# Patient Record
Sex: Male | Born: 1980 | Race: White | Hispanic: No | Marital: Single | Smoking: Former smoker
Health system: Southern US, Community
[De-identification: ages and names within clinical notes are randomized; demographics above are authoritative.]

## PROBLEM LIST (undated history)

## (undated) DIAGNOSIS — F419 Anxiety disorder, unspecified: Secondary | ICD-10-CM

## (undated) DIAGNOSIS — G932 Benign intracranial hypertension: Secondary | ICD-10-CM

## (undated) DIAGNOSIS — G4730 Sleep apnea, unspecified: Secondary | ICD-10-CM

## (undated) DIAGNOSIS — I8290 Acute embolism and thrombosis of unspecified vein: Secondary | ICD-10-CM

## (undated) HISTORY — PX: OTHER SURGICAL HISTORY: SHX169

## (undated) HISTORY — DX: Sleep apnea, unspecified: G47.30

---

## 2014-12-31 DIAGNOSIS — R9389 Abnormal findings on diagnostic imaging of other specified body structures: Secondary | ICD-10-CM | POA: Insufficient documentation

## 2016-07-16 DIAGNOSIS — R931 Abnormal findings on diagnostic imaging of heart and coronary circulation: Secondary | ICD-10-CM | POA: Insufficient documentation

## 2016-11-04 DIAGNOSIS — G4733 Obstructive sleep apnea (adult) (pediatric): Secondary | ICD-10-CM | POA: Diagnosis not present

## 2017-02-15 DIAGNOSIS — D225 Melanocytic nevi of trunk: Secondary | ICD-10-CM | POA: Diagnosis not present

## 2017-02-15 DIAGNOSIS — D485 Neoplasm of uncertain behavior of skin: Secondary | ICD-10-CM | POA: Diagnosis not present

## 2017-02-15 DIAGNOSIS — L578 Other skin changes due to chronic exposure to nonionizing radiation: Secondary | ICD-10-CM | POA: Diagnosis not present

## 2017-02-27 DIAGNOSIS — N644 Mastodynia: Secondary | ICD-10-CM | POA: Diagnosis not present

## 2017-03-07 DIAGNOSIS — D485 Neoplasm of uncertain behavior of skin: Secondary | ICD-10-CM | POA: Diagnosis not present

## 2017-03-14 DIAGNOSIS — D485 Neoplasm of uncertain behavior of skin: Secondary | ICD-10-CM | POA: Diagnosis not present

## 2017-03-14 DIAGNOSIS — L988 Other specified disorders of the skin and subcutaneous tissue: Secondary | ICD-10-CM | POA: Diagnosis not present

## 2017-03-30 DIAGNOSIS — N6459 Other signs and symptoms in breast: Secondary | ICD-10-CM | POA: Diagnosis not present

## 2017-08-10 DIAGNOSIS — F419 Anxiety disorder, unspecified: Secondary | ICD-10-CM | POA: Diagnosis not present

## 2017-08-16 DIAGNOSIS — G4733 Obstructive sleep apnea (adult) (pediatric): Secondary | ICD-10-CM | POA: Diagnosis not present

## 2017-08-18 DIAGNOSIS — H538 Other visual disturbances: Secondary | ICD-10-CM | POA: Diagnosis not present

## 2017-08-18 DIAGNOSIS — D3132 Benign neoplasm of left choroid: Secondary | ICD-10-CM | POA: Diagnosis not present

## 2017-08-18 DIAGNOSIS — Z87891 Personal history of nicotine dependence: Secondary | ICD-10-CM | POA: Diagnosis not present

## 2017-08-18 DIAGNOSIS — H4710 Unspecified papilledema: Secondary | ICD-10-CM | POA: Diagnosis not present

## 2017-08-18 DIAGNOSIS — H539 Unspecified visual disturbance: Secondary | ICD-10-CM | POA: Diagnosis not present

## 2017-08-18 DIAGNOSIS — H40003 Preglaucoma, unspecified, bilateral: Secondary | ICD-10-CM | POA: Diagnosis not present

## 2017-08-18 DIAGNOSIS — R930 Abnormal findings on diagnostic imaging of skull and head, not elsewhere classified: Secondary | ICD-10-CM | POA: Diagnosis not present

## 2017-08-18 DIAGNOSIS — Z6841 Body Mass Index (BMI) 40.0 and over, adult: Secondary | ICD-10-CM | POA: Diagnosis not present

## 2017-08-19 DIAGNOSIS — H4710 Unspecified papilledema: Secondary | ICD-10-CM | POA: Diagnosis not present

## 2017-08-19 DIAGNOSIS — H539 Unspecified visual disturbance: Secondary | ICD-10-CM | POA: Diagnosis not present

## 2017-08-19 DIAGNOSIS — R930 Abnormal findings on diagnostic imaging of skull and head, not elsewhere classified: Secondary | ICD-10-CM | POA: Diagnosis not present

## 2017-08-24 DIAGNOSIS — G932 Benign intracranial hypertension: Secondary | ICD-10-CM | POA: Diagnosis not present

## 2017-08-24 DIAGNOSIS — H4710 Unspecified papilledema: Secondary | ICD-10-CM | POA: Diagnosis not present

## 2017-09-14 DIAGNOSIS — Z6841 Body Mass Index (BMI) 40.0 and over, adult: Secondary | ICD-10-CM | POA: Diagnosis not present

## 2017-09-14 DIAGNOSIS — G932 Benign intracranial hypertension: Secondary | ICD-10-CM | POA: Diagnosis not present

## 2017-09-14 DIAGNOSIS — H539 Unspecified visual disturbance: Secondary | ICD-10-CM | POA: Diagnosis not present

## 2017-09-15 DIAGNOSIS — G932 Benign intracranial hypertension: Secondary | ICD-10-CM | POA: Insufficient documentation

## 2017-10-10 DIAGNOSIS — H4710 Unspecified papilledema: Secondary | ICD-10-CM | POA: Diagnosis not present

## 2017-10-10 DIAGNOSIS — H40003 Preglaucoma, unspecified, bilateral: Secondary | ICD-10-CM | POA: Diagnosis not present

## 2017-10-10 DIAGNOSIS — D3132 Benign neoplasm of left choroid: Secondary | ICD-10-CM | POA: Diagnosis not present

## 2017-10-12 DIAGNOSIS — H40003 Preglaucoma, unspecified, bilateral: Secondary | ICD-10-CM | POA: Diagnosis not present

## 2017-11-09 DIAGNOSIS — H40003 Preglaucoma, unspecified, bilateral: Secondary | ICD-10-CM | POA: Diagnosis not present

## 2017-11-09 DIAGNOSIS — H4710 Unspecified papilledema: Secondary | ICD-10-CM | POA: Diagnosis not present

## 2017-11-09 DIAGNOSIS — D3132 Benign neoplasm of left choroid: Secondary | ICD-10-CM | POA: Diagnosis not present

## 2017-11-23 DIAGNOSIS — H40003 Preglaucoma, unspecified, bilateral: Secondary | ICD-10-CM | POA: Diagnosis not present

## 2017-12-13 DIAGNOSIS — H40003 Preglaucoma, unspecified, bilateral: Secondary | ICD-10-CM | POA: Diagnosis not present

## 2017-12-13 DIAGNOSIS — H4710 Unspecified papilledema: Secondary | ICD-10-CM | POA: Diagnosis not present

## 2017-12-13 DIAGNOSIS — D3132 Benign neoplasm of left choroid: Secondary | ICD-10-CM | POA: Diagnosis not present

## 2018-01-18 DIAGNOSIS — H539 Unspecified visual disturbance: Secondary | ICD-10-CM | POA: Diagnosis not present

## 2018-01-18 DIAGNOSIS — F419 Anxiety disorder, unspecified: Secondary | ICD-10-CM | POA: Diagnosis not present

## 2018-01-18 DIAGNOSIS — Z6841 Body Mass Index (BMI) 40.0 and over, adult: Secondary | ICD-10-CM | POA: Diagnosis not present

## 2018-01-18 DIAGNOSIS — H4710 Unspecified papilledema: Secondary | ICD-10-CM | POA: Diagnosis not present

## 2018-01-18 DIAGNOSIS — G932 Benign intracranial hypertension: Secondary | ICD-10-CM | POA: Diagnosis not present

## 2018-01-18 DIAGNOSIS — Z87891 Personal history of nicotine dependence: Secondary | ICD-10-CM | POA: Diagnosis not present

## 2018-01-18 DIAGNOSIS — Z86718 Personal history of other venous thrombosis and embolism: Secondary | ICD-10-CM | POA: Diagnosis not present

## 2018-01-18 DIAGNOSIS — K219 Gastro-esophageal reflux disease without esophagitis: Secondary | ICD-10-CM | POA: Diagnosis not present

## 2018-02-14 DIAGNOSIS — G932 Benign intracranial hypertension: Secondary | ICD-10-CM | POA: Diagnosis not present

## 2018-02-14 DIAGNOSIS — D3132 Benign neoplasm of left choroid: Secondary | ICD-10-CM | POA: Diagnosis not present

## 2018-02-14 DIAGNOSIS — H40003 Preglaucoma, unspecified, bilateral: Secondary | ICD-10-CM | POA: Diagnosis not present

## 2018-04-20 ENCOUNTER — Ambulatory Visit
Admission: EM | Admit: 2018-04-20 | Discharge: 2018-04-20 | Disposition: A | Discharge status: Home / Self Care | Payer: BLUE CROSS/BLUE SHIELD | Source: Home / Self Care | Attending: Family Medicine | Admitting: Family Medicine

## 2018-04-20 ENCOUNTER — Emergency Department: Payer: BLUE CROSS/BLUE SHIELD

## 2018-04-20 ENCOUNTER — Emergency Department
Admission: EM | Admit: 2018-04-20 | Discharge: 2018-04-21 | Disposition: A | Discharge status: Home / Self Care | Payer: BLUE CROSS/BLUE SHIELD | Attending: Emergency Medicine | Admitting: Emergency Medicine

## 2018-04-20 ENCOUNTER — Other Ambulatory Visit: Payer: Self-pay

## 2018-04-20 ENCOUNTER — Ambulatory Visit (INDEPENDENT_AMBULATORY_CARE_PROVIDER_SITE_OTHER): Payer: BLUE CROSS/BLUE SHIELD

## 2018-04-20 DIAGNOSIS — J069 Acute upper respiratory infection, unspecified: Secondary | ICD-10-CM

## 2018-04-20 DIAGNOSIS — R0781 Pleurodynia: Secondary | ICD-10-CM

## 2018-04-20 DIAGNOSIS — R05 Cough: Secondary | ICD-10-CM

## 2018-04-20 DIAGNOSIS — R7989 Other specified abnormal findings of blood chemistry: Secondary | ICD-10-CM

## 2018-04-20 DIAGNOSIS — R0602 Shortness of breath: Secondary | ICD-10-CM | POA: Diagnosis not present

## 2018-04-20 DIAGNOSIS — R079 Chest pain, unspecified: Secondary | ICD-10-CM | POA: Diagnosis not present

## 2018-04-20 DIAGNOSIS — Z87891 Personal history of nicotine dependence: Secondary | ICD-10-CM | POA: Insufficient documentation

## 2018-04-20 DIAGNOSIS — R0789 Other chest pain: Secondary | ICD-10-CM | POA: Diagnosis not present

## 2018-04-20 DIAGNOSIS — I2699 Other pulmonary embolism without acute cor pulmonale: Secondary | ICD-10-CM | POA: Insufficient documentation

## 2018-04-20 DIAGNOSIS — Z86718 Personal history of other venous thrombosis and embolism: Secondary | ICD-10-CM | POA: Insufficient documentation

## 2018-04-20 DIAGNOSIS — Z7901 Long term (current) use of anticoagulants: Secondary | ICD-10-CM | POA: Diagnosis not present

## 2018-04-20 HISTORY — DX: Acute embolism and thrombosis of unspecified vein: I82.90

## 2018-04-20 HISTORY — DX: Anxiety disorder, unspecified: F41.9

## 2018-04-20 HISTORY — DX: Benign intracranial hypertension: G93.2

## 2018-04-20 LAB — CBC
HEMATOCRIT: 39.6 % — AB (ref 40.0–52.0)
HEMOGLOBIN: 13.6 g/dL (ref 13.0–18.0)
MCH: 29.2 pg (ref 26.0–34.0)
MCHC: 34.3 g/dL (ref 32.0–36.0)
MCV: 85 fL (ref 80.0–100.0)
Platelets: 335 10*3/uL (ref 150–440)
RBC: 4.66 MIL/uL (ref 4.40–5.90)
RDW: 13.5 % (ref 11.5–14.5)
WBC: 10.2 10*3/uL (ref 3.8–10.6)

## 2018-04-20 LAB — FIBRIN DERIVATIVES D-DIMER (ARMC ONLY): FIBRIN DERIVATIVES D-DIMER (ARMC): 2519.38 ng{FEU}/mL — AB (ref 0.00–499.00)

## 2018-04-20 LAB — BASIC METABOLIC PANEL
ANION GAP: 7 (ref 5–15)
BUN: 12 mg/dL (ref 6–20)
CO2: 23 mmol/L (ref 22–32)
Calcium: 8.7 mg/dL — ABNORMAL LOW (ref 8.9–10.3)
Chloride: 109 mmol/L (ref 101–111)
Creatinine, Ser: 1.03 mg/dL (ref 0.61–1.24)
GFR calc non Af Amer: >60 mL/min (ref >60)
Glucose, Bld: 113 mg/dL — ABNORMAL HIGH (ref 65–99)
POTASSIUM: 3.3 mmol/L — AB (ref 3.5–5.1)
SODIUM: 139 mmol/L (ref 135–145)

## 2018-04-20 LAB — TROPONIN I

## 2018-04-20 MED ORDER — APIXABAN 5 MG PO TABS: ORAL_TABLET | ORAL | 0 refills | Status: DC

## 2018-04-20 MED ORDER — IOPAMIDOL (ISOVUE-370) INJECTION 76%
100.0000 mL | Freq: Once | INTRAVENOUS | Status: AC | PRN
  Administered 2018-04-20: 100 mL via INTRAVENOUS

## 2018-04-20 MED ORDER — APIXABAN 5 MG PO TABS
10.0000 mg | ORAL_TABLET | ORAL | Status: AC
  Administered 2018-04-21: 10 mg via ORAL
  Filled 2018-04-20: qty 2

## 2018-04-20 NOTE — ED Provider Notes (Signed)
[facility] ____________________________________________  Time seen: Approximately 6:40 PM  I have reviewed the triage vital signs and the nursing notes.   HISTORY  Chief Complaint Cough   HPI Ilir Mahrt is a 37 y.o. male past medical history of superficial thrombophlebitis and idiopathic intracranial hypertension presenting for evaluation of 4 days of cough and congestion complaints.  Reports intermittent subjective fevers.  States over the last few days he has been having intermittent pain to his left side describing it as a sharp pain, mostly present with deep breaths but also present with movement and overhead reaching.  States minimal presence of discomfort when sitting completely still without deep breaths.  Denies associated chest pain or shortness of breath.  States he has been coughing but not too forcefully.  States cough has been disrupting sleep at night as well as having to toss and turn more frequently.  Reports his son recently sick with similar complaints earlier in the week.  Unresolved with intermittent Advil and DayQuil.  Has continued to remain active.  Continues to drink fluids well.  Denies other aggravating or alleviating factors.  Denies trauma.Denies chest pain, shortness of breath, abdominal pain, extremity pain, extremity swelling or rash. Denies recent sickness. Denies recent antibiotic use.     Past Medical History:  Diagnosis Date  . Anxiety   . Idiopathic intracranial hypertension     There are no active problems to display for this patient.   Past Surgical History:  Procedure Laterality Date  . club feet       No current facility-administered medications for this encounter.   Current Outpatient Medications:  .  acetaZOLAMIDE (DIAMOX) 250 MG tablet, Take 1,500 mg by mouth 2 (two) times daily., Disp: , Rfl:  .  omeprazole (PRILOSEC) 20 MG capsule, omeprazole 20 mg capsule,delayed release, Disp: , Rfl:  .  sertraline (ZOLOFT) 100 MG  tablet, sertraline 100 mg tablet, Disp: , Rfl:  .  ALPRAZolam (XANAX) 0.5 MG tablet, alprazolam 0.5 mg tablet, Disp: , Rfl:   Allergies Cefoxitin  Family History  Problem Relation Age of Onset  . Hypertension Mother   . Healthy Father     Social History Social History   Tobacco Use  . Smoking status: Former Smoker    Packs/day: 0.50    Years: 10.00    Pack years: 5.00    Types: Cigarettes    Last attempt to quit: 11/28/2015    Years since quitting: 2.3  . Smokeless tobacco: Never Used  Substance Use Topics  . Alcohol use: Not Currently  . Drug use: Never    Review of Systems Constitutional: As above.  ENT: No sore throat. Cardiovascular: Denies chest pain. Respiratory: Denies shortness of breath. Gastrointestinal: No abdominal pain.  No nausea, no vomiting.  No diarrhea.  No constipation. Genitourinary: Negative for dysuria. Musculoskeletal: Negative for back pain. Skin: Negative for rash. Neurological: Negative forfocal weakness or numbness. .  ____________________________________________   PHYSICAL EXAM:  VITAL SIGNS: ED Triage Vitals  Enc Vitals Group     BP 04/20/18 1737 (!) 145/81     Pulse Rate 04/20/18 1737 89     Resp 04/20/18 1737 16     Temp 04/20/18 1737 99.6 F (37.6 C)     Temp Source 04/20/18 1737 Oral     SpO2 04/20/18 1737 99 %     Weight 04/20/18 1742 300 lb (136.1 kg)     Height 04/20/18 1742  (1.803 m)     Head Circumference --  Peak Flow --      Pain Score 04/20/18 1742 5     Pain Loc --      Pain Edu? --      Excl. in GC? --     Constitutional: Alert and oriented. Well appearing and in no acute distress. Eyes: Conjunctivae are normal.  Head: Atraumatic. No sinus tenderness to palpation. No swelling. No erythema.  Ears: no erythema, normal TMs bilaterally.   Nose:Nasal congestion  Mouth/Throat: Mucous membranes are moist. No pharyngeal erythema. No tonsillar swelling or exudate.  Neck: No stridor.  No cervical spine  tenderness to palpation. Hematological/Lymphatic/Immunilogical: No cervical lymphadenopathy. Cardiovascular: Normal rate, regular rhythm. Grossly normal heart sounds.  Good peripheral circulation. Respiratory: Normal respiratory effort.  No retractions. No wheezes or rales.  Mild scattered rhonchi.  Good air movement.  Gastrointestinal: Soft and nontender. Obese abdomen.  Musculoskeletal: Ambulatory with steady gait. No cervical, thoracic or lumbar tenderness to palpation.  No lower extremity edema bilaterally.  Left lateral ribs approximately third and fourth mild to moderate direct tenderness to palpation, pain also reproduced with overhead reaching.  No rash, no ecchymosis. Neurologic:  Normal speech and language. No gait instability. Skin:  Skin appears warm, dry and intact. No rash noted. Psychiatric: Mood and affect are normal. Speech and behavior are normal.  ___________________________________________   LABS (all labs ordered are listed, but only abnormal results are displayed)  Labs Reviewed  FIBRIN DERIVATIVES D-DIMER ([facility] ONLY) - Abnormal; Notable for the following components:      Result Value   Fibrin derivatives D-dimer [facility]) 2,519.38 (*)    All other components within normal limits   ____________________________________________  EKG Declined  RADIOLOGY  Dg Chest 2 View  Result Date: 04/20/2018 CLINICAL DATA:  37 y/o M; 4-5 days of dry cough. Left-sided pleuritic chest pain. EXAM: CHEST - 2 VIEW COMPARISON:  None. FINDINGS: Normal cardiac silhouette given projection and technique. Patchy opacities within the left upper lobe lingula and dependent left lower lobe. No pleural effusion or pneumothorax. Bones are unremarkable. IMPRESSION: Patchy opacities in the left upper lobe lingula and dependent left lower lobe probably representing pneumonia. Electronically Signed   By: Mitzi Hansen M.D.   On: 04/20/2018 18:56    ____________________________________________   PROCEDURES Procedures    INITIAL IMPRESSION / ASSESSMENT AND PLAN / ED COURSE  Pertinent labs & imaging results that were available during my care of the patient were reviewed by me and considered in my medical decision making (see chart for details).  Well appearing patient.  No acute distress.  Wife at bedside.  Suspect patient with recent acute viral upper respiratory infection with cough.  However with patient's past medical history, family as well expressed concern of PE.  Patient pulse ox 99% and not tachycardic, however with his medical history, discussed use of d-dimer.  Patient requested d-dimer to be completed.  We will also complete chest x-ray.  Patient declined EKG.  Chest x-ray result as above per radiologist, patchy opacities left upper lobe lingula and left lower lobe probably representing pneumonia.  However patient care everywhere notes reviewed and noted that patient does have a history of left lower lobe nodule, concern possible conflicting x-ray report.  D-dimer returned markedly elevated.  As with d-dimer elevation, recommend patient further evaluation in emergency room at this time, for further imaging patient and family verbalized understanding and agreed with this plan, states that wife will [facility].  Dawn RN Triage was called and given  report.  Patient stable at time of discharge.   ____________________________________________   FINAL CLINICAL IMPRESSION(S) / ED DIAGNOSES  Final diagnoses:  Elevated d-dimer  Rib pain on left side  Acute upper respiratory infection     ED Discharge Orders    None       Note: This dictation was prepared with Dragon dictation along with smaller phrase technology. Any transcriptional errors that result from this process are unintentional.         Renford Dills, NP 04/20/18 2010

## 2018-04-20 NOTE — ED Triage Notes (Signed)
Pt states he has been coughing, chills, felt feverish x 4 days. His main complaint is right sided rib pain that radiates into his axilla. Exacerbated by deep breathing. Pain is intermittent. This has been occurring for 3 days.

## 2018-04-20 NOTE — ED Provider Notes (Signed)
[facility] Emergency Department Provider Note  ____________________________________________   First MD Initiated Contact with Patient 04/20/18 2317     (approximate)  I have reviewed the triage vital signs and the nursing notes.   HISTORY  Chief Complaint Chest Pain and Shortness of Breath    HPI Shane Oneill is a 37 y.o. male with medical history as listed below which specifically includes 3 prior episodes of blood clots in his lungs requiring treatment with Eliquis.  His last round of Eliquis was about a year and half ago.  He presents today for further evaluation after going to urgent care and is being referred to the emergency department due to an elevated d-dimer.  He reports that for about 5 days he has had intermittent sharp stabbing pain in the left lateral chest.  Nothing particular makes his symptoms better or worse.  Occasionally he feels a little bit short of breath due to the pain.  He is also had a mild nonproductive cough for the same period of time.  His wife encouraged him to come in for evaluation given his history of blood clots in the legs and because his symptoms seem to unusual and that the pain with start up suddenly and go away just as quickly.  He has felt subjective fevers and had some shaking chills and initially he thought he might have a viral respiratory infection because his son had one about a week ago, but when the pain persisted he thought he should get evaluated.  He denies nausea, vomiting, any other chest pain, any other shortness of breath including with exertion, abdominal pain, and dysuria.  He describes the symptoms as moderate in severity and as stated above there is nothing in particular that makes them better or worse.  He states that during his prior evaluations for blood clots, he never had any testing to determine genetic predisposition for clot disorders.  No one else in his family has blood clots in his legs or lungs.   He has not had any recent trips or immobilizations or surgeries, he no longer smokes, and he reports no recent pain or swelling in either of his legs.  Past Medical History:  Diagnosis Date  . Anxiety   . Blood clot in vein   . Idiopathic intracranial hypertension     There are no active problems to display for this patient.   Past Surgical History:  Procedure Laterality Date  . club feet      Prior to Admission medications   Medication Sig Start Date End Date Taking? Authorizing Provider  acetaZOLAMIDE (DIAMOX) 250 MG tablet Take 1,500 mg by mouth 2 (two) times daily.    [provider]  ALPRAZolam Prudy Feeler) 0.5 MG tablet alprazolam 0.5 mg tablet 08/10/17   [provider]  apixaban (ELIQUIS) 5 MG TABS tablet Take 2 tablets (10 mg) PO BID x 7 days, then reduce dose to 1 tablet (5 mg) PO BID. 04/20/18   Loleta Rose, MD  omeprazole (PRILOSEC) 20 MG capsule omeprazole 20 mg capsule,delayed release 08/13/17   [provider]  sertraline (ZOLOFT) 100 MG tablet sertraline 100 mg tablet 08/10/17   [provider]    Allergies Cefoxitin  Family History  Problem Relation Age of Onset  . Hypertension Mother   . Healthy Father     Social History Social History   Tobacco Use  . Smoking status: Former Smoker    Packs/day: 0.50    Years: 10.00  Pack years: 5.00    Types: Cigarettes    Last attempt to quit: 11/28/2015    Years since quitting: 2.3  . Smokeless tobacco: Never Used  Substance Use Topics  . Alcohol use: Not Currently  . Drug use: Never    Review of Systems Constitutional: No fever/chills Eyes: No visual changes. ENT: No sore throat. Cardiovascular: Left-sided chest pain and associated shortness of breath as described above. Respiratory: Left-sided chest pain and associated shortness of breath as described above. Gastrointestinal: No abdominal pain.  No nausea, no vomiting.  No diarrhea.  No constipation. Genitourinary:  Negative for dysuria. Musculoskeletal: Negative for neck pain.  Negative for back pain. Integumentary: Negative for rash. Neurological: Negative for headaches, focal weakness or numbness.   ____________________________________________   PHYSICAL EXAM:  VITAL SIGNS: ED Triage Vitals  Enc Vitals Group     BP 04/20/18 2047 131/83     Pulse Rate 04/20/18 2047 92     Resp 04/20/18 2047 (!) 21     Temp 04/20/18 2047 100 F (37.8 C)     Temp Source 04/20/18 2047 Oral     SpO2 04/20/18 2047 96 %     Weight --      Height --      Head Circumference --      Peak Flow --      Pain Score 04/20/18 2049 5     Pain Loc --      Pain Edu? --      Excl. in GC? --     Constitutional: Alert and oriented. Well appearing and in no acute distress. Eyes: Conjunctivae are normal.  Head: Atraumatic. Nose: No congestion/rhinnorhea. Mouth/Throat: Mucous membranes are moist. Neck: No stridor.  No meningeal signs.   Cardiovascular: Normal rate, regular rhythm. Good peripheral circulation. Grossly normal heart sounds. Respiratory: Normal respiratory effort.  No retractions. Lungs CTAB. Gastrointestinal: Obese.  Soft and nontender. No distention.  Musculoskeletal: No lower extremity tenderness nor edema. No gross deformities of extremities. Neurologic:  Normal speech and language. No gross focal neurologic deficits are appreciated.  Skin:  Skin is warm, dry and intact. No rash noted. Psychiatric: Mood and affect are normal. Speech and behavior are normal.  ____________________________________________   LABS (all labs ordered are listed, but only abnormal results are displayed)  Labs Reviewed  CBC - Abnormal; Notable for the following components:      Result Value   HCT 39.6 (*)    All other components within normal limits  BASIC METABOLIC PANEL - Abnormal; Notable for the following components:   Potassium 3.3 (*)    Glucose, Bld 113 (*)    Calcium 8.7 (*)    All other components within  normal limits  TROPONIN I  PROTIME-INR  APTT   ____________________________________________  EKG  ED ECG REPORT I, Loleta Rose, the attending physician, personally viewed and interpreted this ECG.  Date: 04/20/2018 EKG Time: 20: 39 Rate: 92 Rhythm: normal sinus rhythm QRS Axis: normal Intervals: normal ST/T Wave abnormalities: T wave inversions in lead III, otherwise unremarkable. Narrative Interpretation: no evidence of acute ischemia  ____________________________________________  RADIOLOGY I, Loleta Rose, personally discussed these images and results by phone with the on-call radiologist and used this discussion as part of my medical decision making.   ED MD interpretation: Patchy opacities in the left upper lobe on chest x-ray were found to represent infarction on the CT angio chest.  Patient has multiple acute segmental pulmonary emboli in bilateral lungs but no evidence  of right heart strain.  Official radiology report(s): Dg Chest 2 View  Result Date: 04/20/2018 CLINICAL DATA:  37 y/o M; 4-5 days of dry cough. Left-sided pleuritic chest pain. EXAM: CHEST - 2 VIEW COMPARISON:  None. FINDINGS: Normal cardiac silhouette given projection and technique. Patchy opacities within the left upper lobe lingula and dependent left lower lobe. No pleural effusion or pneumothorax. Bones are unremarkable. IMPRESSION: Patchy opacities in the left upper lobe lingula and dependent left lower lobe probably representing pneumonia. Electronically Signed   By: Mitzi Hansen M.D.   On: 04/20/2018 18:56   Ct Angio Chest Pe W And/or Wo Contrast  Result Date: 04/20/2018 CLINICAL DATA:  37 y/o M; left chest pain, shortness of breath, cough. History of PE. Elevated D-dimer. EXAM: CT ANGIOGRAPHY CHEST WITH CONTRAST TECHNIQUE: Multidetector CT imaging of the chest was performed using the standard protocol during bolus administration of intravenous contrast. Multiplanar CT image reconstructions  and MIPs were obtained to evaluate the vascular anatomy. CONTRAST:  ISOVUE-370 IOPAMIDOL (ISOVUE-370) INJECTION 76% COMPARISON:  04/20/2018 chest radiograph FINDINGS: Cardiovascular: Multiple bilateral segmental acute pulmonary emboli. No findings of right heart strain. Normal caliber thoracic aorta and main pulmonary artery. Mediastinum/Nodes: No enlarged mediastinal, hilar, or axillary lymph nodes. Thyroid gland, trachea, and esophagus demonstrate no significant findings. Lungs/Pleura: Small left pleural effusion. Wedge-shaped foci of consolidation within the left upper lobe lingula and left lower lobe with central lucency are compatible with pulmonary infarction. Upper Abdomen: No acute abnormality. Musculoskeletal: No chest wall abnormality. No acute or significant osseous findings. Review of the MIP images confirms the above findings. IMPRESSION: 1. Multiple acute segmental pulmonary emboli in the lungs bilaterally. 2. Small left upper lobe lingula and dependent left lower lobe pulmonary infarctions. 3. Small left pleural effusion. Critical Value/emergent results were called by telephone at the time of interpretation on 04/20/2018 at 11:10 pm to Dr. York Cerise, who verbally acknowledged these results. Electronically Signed   By: Mitzi Hansen M.D.   On: 04/20/2018 23:18    ____________________________________________   PROCEDURES  Critical Care performed: No   Procedure(s) performed:   Procedures   ____________________________________________   INITIAL IMPRESSION / ASSESSMENT AND PLAN / ED COURSE  As part of my medical decision making, I reviewed the following data within the electronic MEDICAL RECORD NUMBER History obtained from family, Nursing notes reviewed and incorporated, Labs reviewed , EKG interpreted  and A phone consult was requested and obtained from this/these consultant(s) (pulmonology, Dr. Meredeth Ide), discussed case with radiologist (Dr. Harrie Jeans)    Differential  diagnosis includes, but is not limited to, pulmonary emboli, pneumonia, pneumothorax, ACS.  The patient had an elevated d-dimer at urgent care with a history of venous thromboembolism so he received a CTA chest immediately upon arrival in the emergency department.  I saw him just after the radiologist called me to discuss the results of multiple acute segmental pulmonary emboli.  He is in no acute distress at this time with normal vital signs, including no tachycardia, no tachypnea, no hypoxemia.  I had a lengthy conversation with the patient and his wife in terms of his history, his need for lifelong anticoagulation given that this is his fourth episode of venous thromboembolism, and the appropriate disposition plan tonight.  He very much does not want to stay in the hospital unless he absolutely has to.  I explained to him about low risk versus high risk patients with pulmonary emboli and he does fall into a relatively low risk category given no concerning  objective findings such as vital sign abnormalities or right heart strain.  Based on talking with the patient and his wife and reviewing all the information, I think he is appropriate for starting treatment with Eliquis and close outpatient follow-up.  However I also called and discussed the case with Dr. Meredeth Ide and he also confirmed that outpatient treatment should be adequate and appropriate for this patient and he is unlikely to receive anything new or different by coming into the hospital.  I ordered the first dose of Eliquis 10 mg by mouth tonight and I do believe that the patient and his wife will follow up appropriately and continue treatment tomorrow.  I gave strict return precautions should he develop any new or worsening symptoms.  They agree with the plan and understand the importance of follow-up.  Dr. Meredeth Ide so they can follow-up with him but I also provided the name and number of a hematologist with whom they can follow-up as well for  additional testing.     ____________________________________________  FINAL CLINICAL IMPRESSION(S) / ED DIAGNOSES  Final diagnoses:  Multiple pulmonary emboli (HCC)     MEDICATIONS GIVEN DURING THIS VISIT:  Medications  apixaban (ELIQUIS) tablet 10 mg (has no administration in time range)  iopamidol (ISOVUE-370) 76 % injection 100 mL (100 mLs Intravenous Contrast Given 04/20/18 2211)     ED Discharge Orders        Ordered    apixaban (ELIQUIS) 5 MG TABS tablet     04/20/18 2353       Note:  This document was prepared using Dragon voice recognition software and may include unintentional dictation errors.    Loleta Rose, MD 04/21/18 [phone]

## 2018-04-20 NOTE — ED Triage Notes (Addendum)
Patient c/o left chest pain, lateral rib pain, SOB, and cough. Patient reports hx of 3 previous blood clots to right leg. Patient has been on eliquis in the past, not taking currently. Patient had a ddimer at urgent care today with a result of 2519

## 2018-04-20 NOTE — Discharge Instructions (Addendum)
Go directly to Emergency room.  °

## 2018-04-21 LAB — PROTIME-INR
INR: 1.14
Prothrombin Time: 14.5 seconds (ref 11.4–15.2)

## 2018-04-21 LAB — APTT: aPTT: 27 seconds (ref 24–36)

## 2018-04-21 NOTE — Discharge Instructions (Signed)
As we discussed, you have multiple blood clots in your lungs known as pulmonary emboli.  I called and spoke by phone with the pulmonologist who agreed that you are appropriate for Eliquis and outpatient follow-up.  We included the name and number of Dr. Meredeth Ide who said he can be your first contact for a follow-up appointment, but I also included the name of Dr. Merlene Pulling, a hematologist, who could see you in clinic to do some blood testing and learn more about your potential clotting disorder if you are unable to get an appointment with Dr. Meredeth Ide.  Please take the prescribed medication as written.  Take your next dose tomorrow late morning after you fill your prescription and take it twice a day as recommended.  Follow-up at the next available opportunity; we recommend that you call on Tuesday and let Dr. Reita Cliche office staff know that he recommended you have a follow-up appointment.  Take over-the-counter ibuprofen and Tylenol according to label instructions as needed for pain and discomfort.  Return to the emergency department if you develop new or worsening symptoms that concern you.

## 2018-04-25 DIAGNOSIS — I2699 Other pulmonary embolism without acute cor pulmonale: Secondary | ICD-10-CM | POA: Diagnosis not present

## 2018-05-01 ENCOUNTER — Inpatient Hospital Stay: Payer: BLUE CROSS/BLUE SHIELD

## 2018-05-01 ENCOUNTER — Inpatient Hospital Stay: Payer: BLUE CROSS/BLUE SHIELD | Attending: Hematology and Oncology | Admitting: Hematology and Oncology

## 2018-05-01 ENCOUNTER — Other Ambulatory Visit: Payer: Self-pay

## 2018-05-01 ENCOUNTER — Encounter: Payer: Self-pay | Admitting: Hematology and Oncology

## 2018-05-01 DIAGNOSIS — Z7901 Long term (current) use of anticoagulants: Secondary | ICD-10-CM | POA: Diagnosis not present

## 2018-05-01 DIAGNOSIS — Z86718 Personal history of other venous thrombosis and embolism: Secondary | ICD-10-CM | POA: Diagnosis not present

## 2018-05-01 DIAGNOSIS — I2699 Other pulmonary embolism without acute cor pulmonale: Secondary | ICD-10-CM | POA: Insufficient documentation

## 2018-05-01 DIAGNOSIS — D6851 Activated protein C resistance: Secondary | ICD-10-CM | POA: Diagnosis not present

## 2018-05-01 LAB — CBC WITH DIFFERENTIAL/PLATELET
BASOS ABS: 0.1 10*3/uL (ref 0–0.1)
BASOS PCT: 1 %
EOS ABS: 0.1 10*3/uL (ref 0–0.7)
EOS PCT: 1 %
HCT: 39.1 % — ABNORMAL LOW (ref 40.0–52.0)
Hemoglobin: 13.5 g/dL (ref 13.0–18.0)
Lymphocytes Relative: 35 %
Lymphs Abs: 2.6 10*3/uL (ref 1.0–3.6)
MCH: 29.4 pg (ref 26.0–34.0)
MCHC: 34.4 g/dL (ref 32.0–36.0)
MCV: 85.5 fL (ref 80.0–100.0)
MONO ABS: 0.3 10*3/uL (ref 0.2–1.0)
Monocytes Relative: 4 %
Neutro Abs: 4.5 10*3/uL (ref 1.4–6.5)
Neutrophils Relative %: 59 %
PLATELETS: 364 10*3/uL (ref 150–440)
RBC: 4.57 MIL/uL (ref 4.40–5.90)
RDW: 13.3 % (ref 11.5–14.5)
WBC: 7.6 10*3/uL (ref 3.8–10.6)

## 2018-05-01 LAB — COMPREHENSIVE METABOLIC PANEL
ALK PHOS: 58 U/L (ref 38–126)
ALT: 22 U/L (ref 17–63)
AST: 18 U/L (ref 15–41)
Albumin: 4.1 g/dL (ref 3.5–5.0)
Anion gap: 8 (ref 5–15)
BILIRUBIN TOTAL: 0.5 mg/dL (ref 0.3–1.2)
BUN: 18 mg/dL (ref 6–20)
CALCIUM: 8.8 mg/dL — AB (ref 8.9–10.3)
CO2: 20 mmol/L — ABNORMAL LOW (ref 22–32)
Chloride: 112 mmol/L — ABNORMAL HIGH (ref 101–111)
Creatinine, Ser: 1.18 mg/dL (ref 0.61–1.24)
GFR calc Af Amer: >60 mL/min (ref >60)
GLUCOSE: 130 mg/dL — AB (ref 65–99)
POTASSIUM: 3.5 mmol/L (ref 3.5–5.1)
Sodium: 140 mmol/L (ref 135–145)
TOTAL PROTEIN: 7.4 g/dL (ref 6.5–8.1)

## 2018-05-01 NOTE — Progress Notes (Signed)
Patient here today as new evaluation regarding PE .  Referred by Dr. York CeriseForbach.

## 2018-05-01 NOTE — Progress Notes (Signed)
[facility] day:  05/01/2018  Chief Complaint: Shane Oneill is a 37 y.o. male with pulmonary embolism who is referred in consultation by Dr. Hinda Kehr for assessment and management.  HPI:  The patient has a history of superficial clots in his right lower extremity.  He describes having clots on 3 separate occasions over the past 3-4 years.  Clots were documented through ultrasound imaging within the [facility] system.  Last duplex on 07/09/2016 revealed a superficial thrombosis of the greater saphenous vein and multiple branches.  The patient has a history of prior pulmonary emboli during Eliquis.  He last received Eliquis about a year and a half ago.  He presented to [facility] emergency room on 04/20/2018 after being referred by urgent care for an elevated D-dimer. He initially presented with fever, chills, and body aches. Of note, patient's son was "sick" the previous week.  On 04/15/2018, he noted intermittent sharp stabbing pain in his left lateral chest.  He also noted a little bit of shortness of breath and a mild nonproductive cough.  He had some suggest subjective fevers.  Chest CT angiogram on 04/20/2018 revealed multiple acute segmental pulmonary emboli in the lungs bilaterally.  There were small left upper lobe lingula and dependent left lower lobe pulmonary infarctions.  There was a small left pleural effusion.  CBC revealed a hematocrit 39.6, hemoglobin 13.6, MCV 85, platelets 335,000, white count 10,200.  PT was 14.5 with an INR of 1.14.  PTT was 27.    He was started on Eliquis.  Patient was seen in consult on 04/25/2018 by Dr. Wallene Huh.  Notes not available for review.   Symptomatically, he feels "much better". He denies shortness of breath, chest pain, palpitations, and lower extremity pain/swelling. He notes that prior to beginning anticoagulation therapy, he had chest discomfort that has since subsided. There is no family of VTE or  early pregnancy loss. He has a 45 minute commute to and from work. He mainly works, in a seated position, at a computer.   Patient denies bleeding; no hematochezia, melena, or gross hematuria. He has had some mild diarrhea for the last few days.    Past Medical History:  Diagnosis Date  . Anxiety   . Blood clot in vein   . Idiopathic intracranial hypertension     Past Surgical History:  Procedure Laterality Date  . club feet      Family History  Problem Relation Age of Onset  . Hypertension Mother   . Healthy Father   . Cancer Maternal Aunt     Social History:  reports that he quit smoking about 2 years ago. His smoking use included cigarettes. He has a 5.00 pack-year smoking history. He has never used smokeless tobacco. He reports that he drank alcohol. He reports that he does not use drugs.  He previously smoked 1/2 pack/day x 10 years.  He rarely uses alcohol. Patient denies known exposures to radiation on toxins.  He works in an office.  The patient is alone today.   Allergies:  Allergies  Allergen Reactions  . Cefoxitin Hives    Current Medications: Current Outpatient Medications  Medication Sig Dispense Refill  . acetaZOLAMIDE (DIAMOX) 250 MG tablet Take 1,500 mg by mouth 2 (two) times daily.    Marland Kitchen ALPRAZolam (XANAX) 0.5 MG tablet alprazolam 0.5 mg tablet    . apixaban (ELIQUIS) 5 MG TABS tablet Take 2 tablets (10 mg) PO BID x 7 days,  then reduce dose to 1 tablet (5 mg) PO BID. 74 tablet 0  . omeprazole (PRILOSEC) 20 MG capsule omeprazole 20 mg capsule,delayed release    . sertraline (ZOLOFT) 100 MG tablet sertraline 100 mg tablet     No current facility-administered medications for this visit.     Review of Systems  Constitutional: Negative for diaphoresis, fever, malaise/fatigue and weight loss.  HENT: Negative.  Negative for congestion, nosebleeds, sinus pain and sore throat.   Eyes: Negative.  Negative for blurred vision, double vision, pain and discharge.   Respiratory: Negative for cough, hemoptysis, sputum production and shortness of breath.   Cardiovascular: Negative.  Negative for chest pain, palpitations, orthopnea, leg swelling and PND.       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 Negative for abdominal pain, blood in stool, constipation, melena, nausea and vomiting.  Genitourinary: Negative.  Negative for dysuria, frequency, hematuria and urgency.  Musculoskeletal: Negative.  Negative for back pain, falls, joint pain and myalgias.  Skin: Negative.  Negative for itching and rash.  Neurological: Negative.  Negative for dizziness, tremors, weakness and headaches.  Endo/Heme/Allergies: Negative.  Does not bruise/bleed easily.  Psychiatric/Behavioral: Negative for depression, memory loss and suicidal ideas. The patient is not nervous/anxious and does not have insomnia.   All other systems reviewed and are negative.  Performance status (ECOG): 1 - Symptomatic but completely ambulatory  Physical Exam: Blood pressure 125/81, pulse 77, temperature (!) 96.2 F (35.7 C), temperature source Tympanic, resp. rate 18, height 5' 11"  (1.803 m), weight (!) 332 lb 2 oz (150.7 kg), SpO2 98 %. GENERAL:  Well developed, well nourished, gentleman sitting comfortably in the exam room in no acute distress. MENTAL STATUS:  Alert and oriented to person, place and time. HEAD:  Brown hair and beard.  Normocephalic, atraumatic, face symmetric, no Cushingoid features. EYES:  Blue eyes.  Pupils equal round and reactive to light and accomodation.  No conjunctivitis or scleral icterus. ENT:  Oropharynx clear without lesion.  Tongue normal. Mucous membranes moist.  RESPIRATORY:  Clear to auscultation without rales, wheezes or rhonchi. CARDIOVASCULAR:  Regular rate and rhythm without murmur, rub or gallop. ABDOMEN:  Soft, non-tender, with active bowel sounds, and  no hepatosplenomegaly.  No masses. SKIN:  No rashes, ulcers or lesions. EXTREMITIES: No edema, no skin discoloration or tenderness.  No palpable cords. LYMPH NODES: No palpable cervical, supraclavicular, axillary or inguinal adenopathy  NEUROLOGICAL: Unremarkable. PSYCH:  Appropriate.   No visits with results within 3 Day(s) from this visit.  Latest known visit with results is:  Admission on 04/20/2018, Discharged on 04/21/2018  Component Date Value Ref Range Status  . Troponin I 04/20/2018 <0.03  <0.03 ng/mL Final   Performed at [facility], [address] [place], [place] [address]  . WBC 04/20/2018 10.2  3.8 - 10.6 K/uL Final  . RBC 04/20/2018 4.66  4.40 - 5.90 MIL/uL Final  . Hemoglobin 04/20/2018 13.6  13.0 - 18.0 g/dL Final  . HCT 04/20/2018 39.6* 40.0 - 52.0 % Final  . MCV 04/20/2018 85.0  80.0 - 100.0 fL Final  . MCH 04/20/2018 29.2  26.0 - 34.0 pg Final  . MCHC 04/20/2018 34.3  32.0 - 36.0 g/dL Final  . RDW 04/20/2018 13.5  11.5 - 14.5 % Final  . Platelets 04/20/2018 335  150 - 440 K/uL Final   Performed at [facility], [address],  [place], [place] [address]  . Sodium 04/20/2018 139  135 - 145 mmol/L Final  . Potassium 04/20/2018 3.3* 3.5 - 5.1 mmol/L Final  . Chloride 04/20/2018 109  101 - 111 mmol/L Final  . CO2 04/20/2018 23  22 - 32 mmol/L Final  . Glucose, Bld 04/20/2018 113* 65 - 99 mg/dL Final  . BUN 04/20/2018 12  6 - 20 mg/dL Final  . Creatinine, Ser 04/20/2018 1.03  0.61 - 1.24 mg/dL Final  . Calcium 04/20/2018 8.7* 8.9 - 10.3 mg/dL Final  . GFR calc non Af Amer 04/20/2018 >60  >60 mL/min Final  . GFR calc Af Amer 04/20/2018 >60  >60 mL/min Final   Comment: (NOTE) The eGFR has been calculated using the CKD EPI equation. This calculation has not been validated in all clinical situations. eGFR's persistently <60 mL/min signify possible Chronic Kidney Disease.   Georgiann Hahn gap 04/20/2018 7  5 - 15 Final   Performed at [facility], [address] [place], [place] [address]  . Prothrombin Time 04/21/2018 14.5  11.4 - 15.2 seconds Final  . INR 04/21/2018 1.14   Final   Performed at [facility], [address] [place], [place] [address]  . aPTT 04/21/2018 27  24 - 36 seconds Final   Performed at [facility], [address] [place], [place] [address]    Assessment:  Shane Oneill is a 37 y.o. male with a history of right lower extremity superficial thrombosis x 3 and a recent history of bilateral pulmonary emboli.  He denies any precipating events.  He sits at a desk at work.  He is on Eliquis.  Chest CT angiogram on 04/20/2018 revealed multiple acute segmental pulmonary emboli in the lungs bilaterally.  There were small left upper lobe lingula and dependent left lower lobe pulmonary infarctions.  There was a small left pleural effusion.  Symptomatically, he feels good.  He denies any lower extremity swelling.  He can breathe easily without discomfort.  Exam is unremarkable.  Plan: 1.  Discuss recurrent VTE.  Discuss potential risk factors for thrombosis.  No family history of thrombosis. 2.  Labs today:  CBC with diff, CMP, Factor V Leiden, prothrombin gene mutation, lupus anticoagulant, anti-cardiolipin antibodies, beta-2 glycoprotein antibodies. 3.  Bilateral lower extremity duplex- baseline. 4.  RTC in 1 week for MD assessment, review of work up, and discussion regarding direction of therapy.    Honor Loh, NP  05/01/2018, 2:00 PM   I saw and evaluated the patient, participating in the key portions of the service and reviewing pertinent diagnostic studies and records.  I reviewed the nurse practitioner's note and agree with the findings and the plan.  The assessment and plan were discussed with the patient.  Several questions were asked by the patient and answered.   Nolon Stalls, MD 05/01/2018, 2:00 PM

## 2018-05-02 DIAGNOSIS — G932 Benign intracranial hypertension: Secondary | ICD-10-CM | POA: Diagnosis not present

## 2018-05-02 DIAGNOSIS — H40003 Preglaucoma, unspecified, bilateral: Secondary | ICD-10-CM | POA: Diagnosis not present

## 2018-05-02 DIAGNOSIS — D3132 Benign neoplasm of left choroid: Secondary | ICD-10-CM | POA: Diagnosis not present

## 2018-05-03 LAB — BETA-2-GLYCOPROTEIN I ABS, IGG/M/A
Beta-2 Glyco I IgG: <9 GPI IgG units (ref 0–20)
Beta-2-Glycoprotein I IgA: <9 GPI IgA units (ref 0–25)
Beta-2-Glycoprotein I IgM: <9 GPI IgM units (ref 0–32)

## 2018-05-03 LAB — DRVVT MIX: DRVVT MIX: 40.5 s (ref 0.0–47.0)

## 2018-05-03 LAB — LUPUS ANTICOAGULANT PANEL
DRVVT: 47.9 s — AB (ref 0.0–47.0)
PTT LA: 34.1 s (ref 0.0–51.9)

## 2018-05-03 LAB — CARDIOLIPIN ANTIBODIES, IGG, IGM, IGA
ANTICARDIOLIPIN IGM: 9 [MPL'U]/mL (ref 0–12)
Anticardiolipin IgG: <9 GPL U/mL (ref 0–14)

## 2018-05-04 ENCOUNTER — Ambulatory Visit
Admission: RE | Admit: 2018-05-04 | Discharge: 2018-05-04 | Disposition: A | Discharge status: Home / Self Care | Payer: BLUE CROSS/BLUE SHIELD | Source: Ambulatory Visit | Attending: Urgent Care | Admitting: Urgent Care

## 2018-05-04 DIAGNOSIS — I2699 Other pulmonary embolism without acute cor pulmonale: Secondary | ICD-10-CM | POA: Insufficient documentation

## 2018-05-07 LAB — FACTOR 5 LEIDEN

## 2018-05-08 ENCOUNTER — Inpatient Hospital Stay (HOSPITAL_BASED_OUTPATIENT_CLINIC_OR_DEPARTMENT_OTHER): Payer: BLUE CROSS/BLUE SHIELD | Admitting: Hematology and Oncology

## 2018-05-08 ENCOUNTER — Other Ambulatory Visit: Payer: Self-pay

## 2018-05-08 ENCOUNTER — Encounter: Payer: Self-pay | Admitting: Hematology and Oncology

## 2018-05-08 VITALS — BP 139/86 | HR 80 | Temp 97.2°F | Resp 20 | Ht 71.0 in | Wt 331.2 lb

## 2018-05-08 DIAGNOSIS — Z86718 Personal history of other venous thrombosis and embolism: Secondary | ICD-10-CM

## 2018-05-08 DIAGNOSIS — D6851 Activated protein C resistance: Secondary | ICD-10-CM

## 2018-05-08 DIAGNOSIS — I2699 Other pulmonary embolism without acute cor pulmonale: Secondary | ICD-10-CM | POA: Diagnosis not present

## 2018-05-08 DIAGNOSIS — Z87891 Personal history of nicotine dependence: Secondary | ICD-10-CM

## 2018-05-08 DIAGNOSIS — Z7901 Long term (current) use of anticoagulants: Secondary | ICD-10-CM | POA: Diagnosis not present

## 2018-05-08 LAB — PROTHROMBIN GENE MUTATION

## 2018-05-08 NOTE — Patient Instructions (Signed)
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 This means that it is harder for your body to stop bleeding, especially after surgery or injury. Factor V is a protein in the blood that helps your blood to clot (blood coagulation factor). If you have a factor V deficiency, your body does not produce enough of this protein, or the protein does not work the way it should. Most cases of factor V deficiency are not severe. What are the causes? Factor V deficiency is almost always caused by a defective factor V gene passed down (inherited) from both parents. In rare cases, the body can develop an antibody that causes factor V deficiency. This can occur after certain medical procedures or after giving birth. It can also happen if you have an autoimmune disease or cancer. What increases the risk? You may be at higher risk for factor V deficiency if you have:  A family history of the condition.  A family history of bleeding disorders.  An autoimmune disease.  Certain cancers.  What are the signs or symptoms? Most symptoms of factor V deficiency involve heavy or abnormal bleeding. They include:  Abnormal bleeding after injury, surgery, or childbirth.  Bleeding under the skin.  Bleeding gums.  Frequent nosebleeds.  Bruising.  Menstrual periods that are unusually long and heavy.  Internal bleeding.  Bleeding from the umbilical cord at birth.  How is this diagnosed? Your health care provider may suspect factor V deficiency if you have symptoms of the condition as well as a family or personal history of bleeding problems. A physical exam will be done. Blood tests may also be done to help make a diagnosis. These tests may include:  Factor assays. These tests are used to check the level of certain clotting factors and how well they work.  Prothrombin time and partial prothrombin time. This test measures how long it takes  your blood to clot.  Inhibitor tests. These tests determine whether your body's immune system affects clotting factors.  How is this treated? Treatment for factor V deficiency may include the following:  Blood transfusions of fresh frozen plasma (FFP) may be done if you have severe bleeding. This may also be done as a precaution whenever you have surgery.  A nasal spray that raises factor levels (desmopressin) may be used before any medical procedures.  Birth control pills may be used to control heavy menstrual bleeding.  Follow these instructions at home:  Be extra careful to avoid injuries or accidents.  Take medicines only as directed by your health care provider.  Tell all of your health care providers that you have factor V deficiency, especially before having a medical or dental procedure.  Wear a medical alert bracelet in case of emergency.  Take steps at home to reduce the risk of bleeding, such as using a soft toothbrush and an Neurosurgeonelectric razor.  Include plenty of fiber in your diet to prevent constipation and reduce your risk for rectal bleeding.  Do not use enemas or rectal thermometers. Contact a health care provider if:  You have excessive or persistent bleeding.  Your skin bruises very easily.  Your menstrual periods are very heavy or last longer than normal. Get help right away if: You have unusual or severe blood loss. This information is not intended to replace advice given to you by your health care provider. Make sure you discuss any questions you have with your health care  provider. Document Released: 06/11/2014 Document Revised: 04/21/2016 Document Reviewed: 04/14/2014 Elsevier Interactive Patient Education  [employer]2018 [employer]

## 2018-05-08 NOTE — Progress Notes (Addendum)
[facility] day:  05/08/2018  Chief Complaint: Shane Oneill is a 37 y.o. male with pulmonary embolism who is seen for review of work-up and discussion regarding direction of therapy.  HPI:  The patient was last seen in the hematology clinic on 05/01/2018 for initial consultation.  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  He then presented with chest pain on 04/20/2018.  He was diagnosed with multiple pulmonary emboli.  He was placed on Eliquis.  He underwent a partial hypercoagulable work-up.  CBC revealed a hematocrit of 39.1, hemoglobin 13.5, MCV 85.5, platelets 364,000, white count 7600 with an ANC of 4500.  Differential was unremarkable.  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  Normal studies included:  prothrombin gene mutation, lupus anticoagulant panel, anticardiolipin antibodies, beta-2 glycoprotein antibodies.    Bilateral lower extremity duplex on 05/04/2018 revealed no evidence of DVT within either lower extremity.  He was seen in consultation by Dr. Wallene Huh on 04/25/2018.  During the interim, he has felt good.   Past Medical History:  Diagnosis Date  . Anxiety   . Blood clot in vein   . Idiopathic intracranial hypertension     Past Surgical History:  Procedure Laterality Date  . club feet      Family History  Problem Relation Age of Onset  . Hypertension Mother   . Healthy Father   . Cancer Maternal Aunt     Social History:  reports that he quit smoking about 2 years ago. His smoking use included cigarettes. He has a 5.00 pack-year smoking history. He has never used smokeless tobacco. He reports that he drank alcohol. He reports that he does not use drugs.  He previously smoked 1/2 pack/day x 10 years.  He rarely uses alcohol. Patient denies known exposures to radiation on toxins.  He works in an office.  The patient is alone today.   Allergies:   Allergies  Allergen Reactions  . Cefoxitin Hives    Current Medications: Current Outpatient Medications  Medication Sig Dispense Refill  . acetaZOLAMIDE (DIAMOX) 250 MG tablet Take 1,500 mg by mouth 2 (two) times daily.    Marland Kitchen ALPRAZolam (XANAX) 0.5 MG tablet alprazolam 0.5 mg tablet    . apixaban (ELIQUIS) 5 MG TABS tablet Take 2 tablets (10 mg) PO BID x 7 days, then reduce dose to 1 tablet (5 mg) PO BID. 74 tablet 0  . omeprazole (PRILOSEC) 20 MG capsule omeprazole 20 mg capsule,delayed release    . sertraline (ZOLOFT) 100 MG tablet sertraline 100 mg tablet     No current facility-administered medications for this visit.     Review of Systems  Constitutional: Negative for diaphoresis, fever, malaise/fatigue and weight loss.  HENT: Negative.  Negative for congestion, nosebleeds, sinus pain and sore throat.   Eyes: Negative.  Negative for blurred vision, double vision, pain and discharge.  Respiratory: Negative for cough, hemoptysis, sputum production and shortness of breath.        Sleep apnea on CPAP  Cardiovascular: Negative.  Negative for chest pain, palpitations, orthopnea, leg swelling and PND.       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 Negative for abdominal pain, blood in stool, constipation, melena, nausea and vomiting.  Genitourinary: Negative.  Negative for dysuria, frequency, hematuria and urgency.  Musculoskeletal: Negative.  Negative for back pain,  falls, joint pain and myalgias.  Skin: Negative.  Negative for itching and rash.  Neurological: Negative.  Negative for dizziness, tremors, weakness and headaches.  Endo/Heme/Allergies: Negative.  Does not bruise/bleed easily.  Psychiatric/Behavioral: Negative for depression, memory loss and suicidal ideas. Substance abuse:   The patient is not nervous/anxious and does not have insomnia.   All other systems reviewed  and are negative.  Performance status (ECOG): 1 - Symptomatic but completely ambulatory  Physical Exam: Blood pressure 139/86, pulse 80, temperature (!) 97.2 F (36.2 C), temperature source Tympanic, resp. rate 20, height 5' 11"  (1.803 m), weight (!) 331 lb 3.2 oz (150.2 kg). GENERAL:  Well developed, well nourished, gentleman sitting comfortably in the exam room in no acute distress. MENTAL STATUS:  Alert and oriented to person, place and time. HEAD:  Brown hair and beard.  Normocephalic, atraumatic, face symmetric, no Cushingoid features. EYES:  Blue eyes.  No conjunctivitis or scleral icterus. NEUROLOGICAL: Unremarkable. PSYCH:  Appropriate.     No visits with results within 3 Day(s) from this visit.  Latest known visit with results is:  Clinical Support on 05/01/2018  Component Date Value Ref Range Status  . Beta-2 Glyco I IgG 05/01/2018 <9  0 - 20 GPI IgG units Final   Comment: (NOTE) The reference interval reflects a 3SD or 99th percentile interval, which is thought to represent a potentially clinically significant result in accordance with the International Consensus Statement on the classification criteria for definitive antiphospholipid syndrome (APS). J Thromb Haem 2006;4:295-306.   . Beta-2-Glycoprotein I IgM 05/01/2018 <9  0 - 32 GPI IgM units Final   Comment: (NOTE) The reference interval reflects a 3SD or 99th percentile interval, which is thought to represent a potentially clinically significant result in accordance with the International Consensus Statement on the classification criteria for definitive antiphospholipid syndrome (APS). J Thromb Haem 2006;4:295-306. Performed At: [facility] [place], [place] [id number] Rush Farmer MD PZ:[id number]   . Beta-2-Glycoprotein I IgA 05/01/2018 <9  0 - 25 GPI IgA units Final   Comment: (NOTE) The reference interval reflects a 3SD or 99th percentile interval, which is thought to represent a  potentially clinically significant result in accordance with the International Consensus Statement on the classification criteria for definitive antiphospholipid syndrome (APS). J Thromb Haem 2006;4:295-306. Performed at [facility], [address], [place], [place] [address]   . Anticardiolipin IgG 05/01/2018 <9  0 - 14 GPL U/mL Final   Comment: (NOTE)                          Negative:              <15                          Indeterminate:     15 - 20                          Low-Med Positive: >20 - 80                          High Positive:         >80   . Anticardiolipin IgM 05/01/2018 9  0 - 12 MPL U/mL Final   Comment: (NOTE)  Negative:              <13                          Indeterminate:     13 - 20                          Low-Med Positive: >20 - 80                          High Positive:         >80   . Anticardiolipin IgA 05/01/2018 <9  0 - 11 APL U/mL Final   Comment: (NOTE)                          Negative:              <12                          Indeterminate:     12 - 20                          Low-Med Positive: >20 - 80                          High Positive:         >80 Performed At: [facility] [address] [place], [place] [id number] Rush Farmer MD [id number]:[id number] Performed at [facility], [address], [place], [place] [address]   . PTT Lupus Anticoagulant 05/01/2018 34.1  0.0 - 51.9 sec Final  . DRVVT 05/01/2018 47.9* 0.0 - 47.0 sec Final  . Lupus Anticoag Interp 05/01/2018 Comment:   Corrected   Comment: (NOTE) No lupus anticoagulant was detected. An extended dRVVT that corrects on mixing with normal plasma can be caused by a deficiency of one of the common pathway factors (X, V, II or fibrinogen). Performed At: [facility] [place], [place] [id number] Rush Farmer MD CZ:[id number] Performed at [facility], [address] [place], [place] [address]   .  Recommendations-PTGENE: 05/01/2018 Comment   Final   Comment: (NOTE) NEGATIVE No mutation identified. Comment: A point mutation (G20210A) in the factor II (prothrombin) gene is the second most common cause of inherited thrombophilia. The incidence of this mutation in the [place] Caucasian population is about 2% and in the [place] American population it is approximately 0.5%. This mutation is rare in the [place] and Native American population. Being heterozygous for a prothrombin mutation increases the risk for developing venous thrombosis about 2 to 3 times above the general population risk. Being homozygous for the prothrombin gene mutation increases the relative risk for venous thrombosis further, although it is not yet known how much further the risk is increased. In women heterozygous for the prothrombin gene mutation, the use of estrogen containing oral contraceptives increases the relative risk of venous thrombosis about 16 times and the risk of developing cerebral thrombosis is also significantly increased. In pregnancy the pr                          othrombin gene mutation increases risk for venous thrombosis and may increase risk for stillbirth, placental abruption,  pre-eclampsia and fetal growth restriction. If the patient possesses two or more congenital or acquired thrombophilic risk factors, the risk for thrombosis may rise to more than the sum of the risk ratios for the individual mutations. This assay detects only the prothrombin G20210A mutation and does not measure genetic abnormalities elsewhere in the genome. Other thrombotic risk factors may be pursued through systematic clinical laboratory analysis. These factors include the R506Q (Leiden) mutation in the Factor V gene, plasma homocysteine levels, as well as testing for deficiencies of antithrombin III, protein C and protein S. Genetic Counselors are available for health care providers to discuss results at  [phone]-GENE [phone]). Methodology: DNA analysis of the Factor II gene was performed by PCR amplification followed by restriction analysis. The di                          agnostic sensitivity is >99% for both. All the tests must be combined with clinical information for the most accurate interpretation. Molecular-based testing is highly accurate, but as in any laboratory test, diagnostic errors may occur. This test was developed and its performance characteristics determined by [facility]. It has not been cleared or approved by the Food and Drug Administration. Poort SR, et al. Blood. 1996; 42:6834-1962. Varga EA. Circulation. 2004; 229:N98-X21. Mervin Hack, et [place]; 19:700-703. Allison Quarry, PhD, [facility] Ruben Reason, PhD, [facility] Annetta Maw, M.S., PhD, [facility] Alfredo Bach, PhD, [facility] Norva Riffle, PhD, [facility] Earlean Polka, PhD, [facility] Performed At: [facility] [address] [place], [place] [id number] Nechama Guard MD KG:[id number] Performed at [facility], [address] [place], [place] [address]   . Recommendations-F5LEID: 05/01/2018 Comment*  Final   Comment: (NOTE) RESULT: SINGLE R506Q MUTATION IDENTIFIED (HETEROZYGOTE) Factor V Leiden is a specific mutation (R506Q) in the factor V gene that is associated with an increased risk of venous thrombosis. Factor V Leiden is more resistant to inactivation by activated protein C.  As a result, factor V persists in the circulation leading to a mild hypercoagulable state.  Factor V Leiden has been reported in patients with deep vein thrombosis, pulmonary embolus, central retinal vein occulsion, cerebral sinus thrombosis, and hepatic vein thrombosis. The relative risk of venous thrombosis is increased approximately 4-8 fold in individuals who are heterozygous.  About 3- 8% of the general [place] and European population are heterozygous.  The risk of venous thrombosis  increases exponentially in patients with more than one risk factor, including:  age, surgery, oral contraceptive use, pregnancy, elevated homocysteine levels, or a Factor II/prothrombin mutation (G20210A).  Additionally, for i                          ndividuals found to be heterozygous for the Factor V Leiden mutation, presence of a second mutation, Factor V R2, further increases the risk if venous thrombosis. Contact [facility]'s Genetics Customer Service at [phone] for further information on both the Factor II (Prothrombin) DNA Analysis, and Factor V R2 DNA Analysis tests. **Genetic counselors are available for health care providers to**  discuss results at [phone] [phone]). Methodology: DNA analysis of the Factor V gene was performed by allele-specific PCR. The diagnostic sensitivity and specificity is >99% for both. Molecular-based testing is highly accurate, but as in any laboratory test, diagnostic errors may occur. All test results must be combined with clinical information for the most accurate interpretation. This test was developed  and its performance characteristics determined by [facility]. It has not been cleared or approved by the Food and Drug Administration. References: Voelkerding K (1996).                            Clin Lab Med [phone]. Allison Quarry, PhD, [facility] Ruben Reason, PhD, [facility] Annetta Maw, M.S., PhD, [facility] Alfredo Bach, PhD, [facility] Norva Riffle, PhD, [facility] Earlean Polka PhD, [facility] Performed At: [facility] [address] [place], [place] [id number] Nechama Guard MD ZM:[id number] Performed at [facility], [address], [place], [place] [address]   . Sodium 05/01/2018 140  135 - 145 mmol/L Final  . Potassium 05/01/2018 3.5  3.5 - 5.1 mmol/L Final  . Chloride 05/01/2018 112* 101 - 111 mmol/L Final  . CO2 05/01/2018 20* 22 - 32 mmol/L Final  . Glucose, Bld 05/01/2018 130* 65 - 99 mg/dL Final  . BUN  05/01/2018 18  6 - 20 mg/dL Final  . Creatinine, Ser 05/01/2018 1.18  0.61 - 1.24 mg/dL Final  . Calcium 05/01/2018 8.8* 8.9 - 10.3 mg/dL Final  . Total Protein 05/01/2018 7.4  6.5 - 8.1 g/dL Final  . Albumin 05/01/2018 4.1  3.5 - 5.0 g/dL Final  . AST 05/01/2018 18  15 - 41 U/L Final  . ALT 05/01/2018 22  17 - 63 U/L Final  . Alkaline Phosphatase 05/01/2018 58  38 - 126 U/L Final  . Total Bilirubin 05/01/2018 0.5  0.3 - 1.2 mg/dL Final  . GFR calc non Af Amer 05/01/2018 >60  >60 mL/min Final  . GFR calc Af Amer 05/01/2018 >60  >60 mL/min Final   Comment: (NOTE) The eGFR has been calculated using the CKD EPI equation. This calculation has not been validated in all clinical situations. eGFR's persistently <60 mL/min signify possible Chronic Kidney Disease.   Georgiann Hahn gap 05/01/2018 8  5 - 15 Final   Performed at [facility], [address] [place], [place] [address]  . WBC 05/01/2018 7.6  3.8 - 10.6 K/uL Final  . RBC 05/01/2018 4.57  4.40 - 5.90 MIL/uL Final  . Hemoglobin 05/01/2018 13.5  13.0 - 18.0 g/dL Final  . HCT 05/01/2018 39.1* 40.0 - 52.0 % Final  . MCV 05/01/2018 85.5  80.0 - 100.0 fL Final  . MCH 05/01/2018 29.4  26.0 - 34.0 pg Final  . MCHC 05/01/2018 34.4  32.0 - 36.0 g/dL Final  . RDW 05/01/2018 13.3  11.5 - 14.5 % Final  . Platelets 05/01/2018 364  150 - 440 K/uL Final  . Neutrophils Relative % 05/01/2018 59  % Final  . Neutro Abs 05/01/2018 4.5  1.4 - 6.5 K/uL Final  . Lymphocytes Relative 05/01/2018 35  % Final  . Lymphs Abs 05/01/2018 2.6  1.0 - 3.6 K/uL Final  . Monocytes Relative 05/01/2018 4  % Final  . Monocytes Absolute 05/01/2018 0.3  0.2 - 1.0 K/uL Final  . Eosinophils Relative 05/01/2018 1  % Final  . Eosinophils Absolute 05/01/2018 0.1  0 - 0.7 K/uL Final  . Basophils Relative 05/01/2018 1  % Final  . Basophils Absolute 05/01/2018 0.1  0 - 0.1 K/uL Final   Performed at [facility], [address], [place], [place] [address]  . dRVVT Mix  05/01/2018 40.5  0.0 - 47.0 sec Final   Comment: (NOTE) Performed At: [facility] [address] [place], [place] [id number] Rush Farmer  MD ZC:[id number] Performed at [facility], [address] [place], [place] [address]     Assessment:  Shane Oneill is a 37 y.o. male with a history of right lower extremity superficial thrombosis x 3 and a recent history of bilateral pulmonary emboli.  He denies any precipating events.  He sits at a desk (sedentary).  He denies any family history of thrombosis.  He is on Eliquis.  Chest CT angiogram on 04/20/2018 revealed multiple acute segmental pulmonary emboli in the lungs bilaterally.  There were small left upper lobe lingula and dependent left lower lobe pulmonary infarctions.  There was a small left pleural effusion.  Bilateral lower extremity duplex on 05/04/2018 revealed no evidence of DVT within either lower extremity.  Hypercoagulable work-up on 04/30/2018 revealed Factor V Leiden (single R506Q mutation).  Normal studies included:  prothrombin gene mutation, lupus anticoagulant panel, anticardiolipin antibodies, beta-2 glycoprotein antibodies.    Symptomatically, he feels good.  He denies any lower extremity swelling.  He can breathe easily without discomfort.  Exam is unremarkable.  Plan: 1. Discuss partial hypercoagulable work-up. Factor V Leiden (+) heterozygote.  Discuss genetic link. Patient has a son, who has a 50% risk of having Factor V deficiency.  Discuss testing for patient's son.  2. Review lower extremity duplex- no evidence of DVT. 3. Continue Eliquis.  4. RTC in 3 months for MD assessment and labs (CBC with diff, CMP, protein C antigen/activity, protein S antigen/activity, ATIII antigen/activity- week before).   Honor Loh, NP  05/08/2018, 4:45 PM   I saw and evaluated the patient, participating in the key portions of the service and reviewing pertinent diagnostic studies and records.  I reviewed the nurse  practitioner's note and agree with the findings and the plan.  The assessment and plan were discussed with the patient.  Several questions were asked by the patient and answered.   Nolon Stalls, MD 05/08/2018, 4:45 PM

## 2018-05-19 ENCOUNTER — Encounter: Payer: Self-pay | Admitting: Hematology and Oncology

## 2018-05-19 ENCOUNTER — Other Ambulatory Visit: Payer: Self-pay | Admitting: Urgent Care

## 2018-05-19 MED ORDER — APIXABAN 5 MG PO TABS: ORAL_TABLET | ORAL | 0 refills | Status: DC

## 2018-05-20 ENCOUNTER — Encounter: Payer: Self-pay | Admitting: Hematology and Oncology

## 2018-05-21 ENCOUNTER — Encounter: Payer: Self-pay | Admitting: Hematology and Oncology

## 2018-05-21 ENCOUNTER — Other Ambulatory Visit: Payer: Self-pay | Admitting: Urgent Care

## 2018-05-21 ENCOUNTER — Encounter: Payer: Self-pay | Admitting: Urgent Care

## 2018-05-22 ENCOUNTER — Encounter: Payer: Self-pay | Admitting: Hematology and Oncology

## 2018-07-02 ENCOUNTER — Encounter: Payer: Self-pay | Admitting: Hematology and Oncology

## 2018-07-25 ENCOUNTER — Other Ambulatory Visit: Payer: Self-pay | Admitting: Urgent Care

## 2018-08-02 DIAGNOSIS — D3132 Benign neoplasm of left choroid: Secondary | ICD-10-CM | POA: Diagnosis not present

## 2018-08-02 DIAGNOSIS — H4710 Unspecified papilledema: Secondary | ICD-10-CM | POA: Diagnosis not present

## 2018-08-02 DIAGNOSIS — H40003 Preglaucoma, unspecified, bilateral: Secondary | ICD-10-CM | POA: Diagnosis not present

## 2018-08-02 DIAGNOSIS — H04123 Dry eye syndrome of bilateral lacrimal glands: Secondary | ICD-10-CM | POA: Diagnosis not present

## 2018-08-09 ENCOUNTER — Inpatient Hospital Stay: Payer: BLUE CROSS/BLUE SHIELD | Attending: Hematology and Oncology

## 2018-08-09 ENCOUNTER — Other Ambulatory Visit: Payer: Self-pay | Admitting: Urgent Care

## 2018-08-09 DIAGNOSIS — I2699 Other pulmonary embolism without acute cor pulmonale: Secondary | ICD-10-CM | POA: Insufficient documentation

## 2018-08-09 DIAGNOSIS — D6851 Activated protein C resistance: Secondary | ICD-10-CM

## 2018-08-09 LAB — COMPREHENSIVE METABOLIC PANEL
ALK PHOS: 51 U/L (ref 38–126)
ALT: 22 U/L (ref 0–44)
ANION GAP: 5 (ref 5–15)
AST: 16 U/L (ref 15–41)
Albumin: 4.1 g/dL (ref 3.5–5.0)
BILIRUBIN TOTAL: 0.7 mg/dL (ref 0.3–1.2)
BUN: 16 mg/dL (ref 6–20)
CALCIUM: 9 mg/dL (ref 8.9–10.3)
CO2: 21 mmol/L — AB (ref 22–32)
CREATININE: 1.22 mg/dL (ref 0.61–1.24)
Chloride: 115 mmol/L — ABNORMAL HIGH (ref 98–111)
GFR calc non Af Amer: >60 mL/min (ref >60)
Glucose, Bld: 91 mg/dL (ref 70–99)
Potassium: 3.5 mmol/L (ref 3.5–5.1)
Sodium: 141 mmol/L (ref 135–145)
TOTAL PROTEIN: 7.3 g/dL (ref 6.5–8.1)

## 2018-08-09 LAB — CBC WITH DIFFERENTIAL/PLATELET
BASOS ABS: 0 10*3/uL (ref 0–0.1)
Basophils Relative: 0 %
EOS ABS: 0.1 10*3/uL (ref 0–0.7)
Eosinophils Relative: 1 %
HCT: 40.8 % (ref 40.0–52.0)
Hemoglobin: 13.8 g/dL (ref 13.0–18.0)
Lymphocytes Relative: 34 %
Lymphs Abs: 2.2 10*3/uL (ref 1.0–3.6)
MCH: 29.5 pg (ref 26.0–34.0)
MCHC: 33.7 g/dL (ref 32.0–36.0)
MCV: 87.6 fL (ref 80.0–100.0)
MONO ABS: 0.4 10*3/uL (ref 0.2–1.0)
MONOS PCT: 6 %
NEUTROS PCT: 59 %
Neutro Abs: 3.9 10*3/uL (ref 1.4–6.5)
Platelets: 243 10*3/uL (ref 150–440)
RBC: 4.66 MIL/uL (ref 4.40–5.90)
RDW: 14.1 % (ref 11.5–14.5)
WBC: 6.7 10*3/uL (ref 3.8–10.6)

## 2018-08-10 LAB — ANTITHROMBIN PANEL
ANTITHROMB III FUNC: 110 % (ref 75–135)
AT III AG PPP IMM-ACNC: 92 % (ref 72–124)

## 2018-08-10 LAB — PROTEIN S ACTIVITY: Protein S Activity: 68 % (ref 63–140)

## 2018-08-10 LAB — PROTEIN C ACTIVITY: PROTEIN C ACTIVITY: 129 % (ref 73–180)

## 2018-08-10 LAB — PROTEIN S, TOTAL: PROTEIN S AG TOTAL: 74 % (ref 60–150)

## 2018-08-10 LAB — PROTEIN C, TOTAL: PROTEIN C, TOTAL: 97 % (ref 60–150)

## 2018-08-15 ENCOUNTER — Ambulatory Visit (INDEPENDENT_AMBULATORY_CARE_PROVIDER_SITE_OTHER): Payer: BLUE CROSS/BLUE SHIELD | Admitting: Family Medicine

## 2018-08-15 ENCOUNTER — Encounter: Payer: Self-pay | Admitting: Family Medicine

## 2018-08-15 VITALS — BP 135/80 | HR 74 | Ht 71.0 in | Wt 331.0 lb

## 2018-08-15 DIAGNOSIS — I2699 Other pulmonary embolism without acute cor pulmonale: Secondary | ICD-10-CM | POA: Diagnosis not present

## 2018-08-15 DIAGNOSIS — G4733 Obstructive sleep apnea (adult) (pediatric): Secondary | ICD-10-CM | POA: Insufficient documentation

## 2018-08-15 DIAGNOSIS — F419 Anxiety disorder, unspecified: Secondary | ICD-10-CM | POA: Insufficient documentation

## 2018-08-15 NOTE — Progress Notes (Signed)
BP 135/80   Pulse 74   Ht 5\' 11"  (1.803 m)   Wt (!) 331 lb (150.1 kg)   SpO2 95%   BMI 46.17 kg/m    Subjective:    Patient ID: Shane Oneill, male    DOB: [birth year], 37 y.o.   MRN: [id number]  HPI: Shane Oneill is a 37 y.o. male who presents today to establish care.   Chief Complaint  Patient presents with  . New Patient (Initial Visit)  . Pulmonary embolism   Shane Oneill presents today to establish care. He has a history of superficial clots in his RLE and PE which was treated with eliquis about a year and a half ago- has been off of it sine then. Went to the ER at the end of May with an elevated d-dimer and chest pain/SOB/cough. He had a CTA done at that time which showed multiple PEs in the lung bilaterally. He was started on Eliquis. He has followed up with Pulmonary and Oncology and is being worked up for a clotting disorder. On his blood work he had a factor V Leiden mutation. He is due to return to Oncology tomorrow for further work up.   He notes that today, he is feeling fine. He states that even when he was sick, he didn't really have a lot of symptoms. He notes that the only thing that was bothering him was that he had some trouble taking a deep breath. Has been taking his eliquis 2x a day.   He notes that about a year ago, he started noticing some issues with his vision. 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 He had spinal tap to remove CSF. He sees his eye doctor pretty regularly and has been following with them. Feeling better with that.   Active Ambulatory Problems    Diagnosis Date Noted  . Pulmonary emboli (HCC) 05/01/2018  . Factor V Leiden mutation (HCC) 05/08/2018  . Abnormal CT of the chest 12/31/2014  . Agatston coronary artery calcium score between 200 and 399 07/16/2016  . Anxiety 08/15/2018  . IIH (idiopathic intracranial hypertension) 09/15/2017  . OSA (obstructive sleep apnea) 08/15/2018   Resolved Ambulatory Problems   Diagnosis Date Noted  . No Resolved Ambulatory Problems   Past Medical History:  Diagnosis Date  . Blood clot in vein   . Idiopathic intracranial hypertension   . Sleep apnea    Past Surgical History:  Procedure Laterality Date  . club feet     Outpatient Encounter Medications as of 08/15/2018  Medication Sig  . acetaZOLAMIDE (DIAMOX) 250 MG tablet Take 1,500 mg by mouth 2 (two) times daily.  Marland Kitchen ALPRAZolam (XANAX) 0.5 MG tablet alprazolam 0.5 mg tablet  . ELIQUIS 5 MG TABS tablet TAKE 1 TABLET BY MOUTH TWICE DAILY  . omeprazole (PRILOSEC) 20 MG capsule omeprazole 20 mg capsule,delayed release  . sertraline (ZOLOFT) 100 MG tablet sertraline 100 mg tablet   No facility-administered encounter medications on file as of 08/15/2018.    Allergies  Allergen Reactions  . Cefoxitin Hives   Social History   Socioeconomic History  . Marital status: Single    Spouse name: Not on file  . Number of children: Not on file  . Years of education: Not on file  . Highest education level: Not on file  Occupational History  . Not on file  Social Needs  . Financial resource strain: Not on file  . Food insecurity:    Worry: Not  on file    Inability: Not on file  . Transportation needs:    Medical: Not on file    Non-medical: Not on file  Tobacco Use  . Smoking status: Former Smoker    Packs/day: 0.50    Years: 10.00    Pack years: 5.00    Types: Cigarettes    Last attempt to quit: 11/28/2015    Years since quitting: 2.7  . Smokeless tobacco: Never Used  Substance and Sexual Activity  . Alcohol use: Not Currently  . Drug use: Never  . Sexual activity: Not on file  Lifestyle  . Physical activity:    Days per week: Not on file    Minutes per session: Not on file  . Stress: Not on file  Relationships  . Social connections:    Talks on phone: Not on file    Gets together: Not on file    Attends religious service: Not on file    Active member of club or organization: Not on file     Attends meetings of clubs or organizations: Not on file    Relationship status: Not on file  Other Topics Concern  . Not on file  Social History Narrative  . Not on file   Family History  Problem Relation Age of Onset  . Hypertension Mother   . Healthy Father   . Cancer Maternal Aunt     Review of Systems  Constitutional: Negative.   Respiratory: Negative.   Cardiovascular: Negative.   Musculoskeletal: Negative.   Psychiatric/Behavioral: Negative.     Per HPI unless specifically indicated above     Objective:    BP 135/80   Pulse 74   Ht 5\' 11"  (1.803 m)   Wt (!) 331 lb (150.1 kg)   SpO2 95%   BMI 46.17 kg/m   Wt Readings from Last 3 Encounters:  08/16/18 (!) 341 lb 2 oz (154.7 kg)  08/15/18 (!) 331 lb (150.1 kg)  05/08/18 (!) 331 lb 3.2 oz (150.2 kg)    Physical Exam  Constitutional: He is oriented to person, place, and time. He appears well-developed and well-nourished. No distress.  HENT:  Head: Normocephalic and atraumatic.  Right Ear: Hearing normal.  Left Ear: Hearing normal.  Nose: Nose normal.  Eyes: Conjunctivae and lids are normal. Right eye exhibits no discharge. Left eye exhibits no discharge. No scleral icterus.  Cardiovascular: Normal rate, regular rhythm, normal heart sounds and intact distal pulses. Exam reveals no gallop and no friction rub.  No murmur heard. Pulmonary/Chest: Effort normal and breath sounds normal. No stridor. No respiratory distress. He has no wheezes. He has no rales. He exhibits no tenderness.  Musculoskeletal: Normal range of motion.  Neurological: He is alert and oriented to person, place, and time.  Skin: Skin is warm, dry and intact. Capillary refill takes less than 2 seconds. No rash noted. He is not diaphoretic. No erythema. No pallor.  Psychiatric: He has a normal mood and affect. His speech is normal and behavior is normal. Judgment and thought content normal. Cognition and memory are normal.  Nursing note and vitals  reviewed.   Results for orders placed or performed in visit on 08/09/18  Protein S activity  Result Value Ref Range   Protein S Activity 68 63 - 140 %  Protein C, total  Result Value Ref Range   Protein C, Total 97 60 - 150 %  Protein C activity  Result Value Ref Range   Protein C Activity  129 73 - 180 %  Antithrombin panel  Result Value Ref Range   Antithrombin Activity 110 75 - 135 %   AT III AG PPP IMM-ACNC 92 72 - 124 %  Comprehensive metabolic panel  Result Value Ref Range   Sodium 141 135 - 145 mmol/L   Potassium 3.5 3.5 - 5.1 mmol/L   Chloride 115 (H) 98 - 111 mmol/L   CO2 21 (L) 22 - 32 mmol/L   Glucose, Bld 91 70 - 99 mg/dL   BUN 16 6 - 20 mg/dL   Creatinine, Ser 1.611.22 0.61 - 1.24 mg/dL   Calcium 9.0 8.9 - 09.610.3 mg/dL   Total Protein 7.3 6.5 - 8.1 g/dL   Albumin 4.1 3.5 - 5.0 g/dL   AST 16 15 - 41 U/L   ALT 22 0 - 44 U/L   Alkaline Phosphatase 51 38 - 126 U/L   Total Bilirubin 0.7 0.3 - 1.2 mg/dL   GFR calc non Af Amer >60 >60 mL/min   GFR calc Af Amer >60 >60 mL/min   Anion gap 5 5 - 15  CBC with Differential/Platelet  Result Value Ref Range   WBC 6.7 3.8 - 10.6 K/uL   RBC 4.66 4.40 - 5.90 MIL/uL   Hemoglobin 13.8 13.0 - 18.0 g/dL   HCT 04.540.8 40.940.0 - 81.152.0 %   MCV 87.6 80.0 - 100.0 fL   MCH 29.5 26.0 - 34.0 pg   MCHC 33.7 32.0 - 36.0 g/dL   RDW 91.414.1 78.211.5 - 95.614.5 %   Platelets 243 150 - 440 K/uL   Neutrophils Relative % 59 %   Neutro Abs 3.9 1.4 - 6.5 K/uL   Lymphocytes Relative 34 %   Lymphs Abs 2.2 1.0 - 3.6 K/uL   Monocytes Relative 6 %   Monocytes Absolute 0.4 0.2 - 1.0 K/uL   Eosinophils Relative 1 %   Eosinophils Absolute 0.1 0 - 0.7 K/uL   Basophils Relative 0 %   Basophils Absolute 0.0 0 - 0.1 K/uL  Protein S, total  Result Value Ref Range   Protein S Ag, Total 74 60 - 150 %      Assessment & Plan:   Problem List Items Addressed This Visit      Cardiovascular and Mediastinum   Pulmonary emboli (HCC) - Primary    Continue eliquis. Continue  to follow with hematology. Call with any concerns. Continue to monitor. Call with any concerns.           Follow up plan: Return in about 6 months (around 02/13/2019) for Physical.

## 2018-08-16 ENCOUNTER — Encounter: Payer: Self-pay | Admitting: Hematology and Oncology

## 2018-08-16 ENCOUNTER — Inpatient Hospital Stay (HOSPITAL_BASED_OUTPATIENT_CLINIC_OR_DEPARTMENT_OTHER): Payer: BLUE CROSS/BLUE SHIELD | Admitting: Hematology and Oncology

## 2018-08-16 VITALS — BP 144/88 | HR 75 | Temp 97.9°F | Resp 18 | Wt 341.1 lb

## 2018-08-16 DIAGNOSIS — Z79899 Other long term (current) drug therapy: Secondary | ICD-10-CM | POA: Diagnosis not present

## 2018-08-16 DIAGNOSIS — I2699 Other pulmonary embolism without acute cor pulmonale: Secondary | ICD-10-CM | POA: Diagnosis not present

## 2018-08-16 DIAGNOSIS — D6851 Activated protein C resistance: Secondary | ICD-10-CM | POA: Diagnosis not present

## 2018-08-16 NOTE — Progress Notes (Signed)
Patient offers no complaints today. 

## 2018-08-16 NOTE — Progress Notes (Signed)
[facility] day:  08/16/2018  Chief Complaint: Shane Oneill is a 37 y.o. male with pulmonary embolism and heterozygosity for factor V Leiden who is seen for 3 month assessment.  HPI:  The patient was last seen in the hematology clinic on 05/08/2018.  At that time, he felt good.  He denied any lower extremity swelling.  He could breathe easily without discomfort.  Exam was unremarkable.  Labs on 08/09/2018 revealed a hematocrit of 40.8, hemoglobin 13.8, MCV 87.6, platelets 243,000, WBC 6700 with an ANC of 3900.  Creatinine was 1.22.  LFTs were normal.  Protein C total was 97% and protein C activity 129%.  Protein S antigen was 74% and protein S activity 68%.  ATIII antigen was 92% and ATIII activity 110%  During the interim, he has felt "back to normal".  He denies any bruising or bleeding.  He denies any lower extremity swelling or shortness of breath.   Past Medical History:  Diagnosis Date  . Anxiety   . Blood clot in vein   . Idiopathic intracranial hypertension   . Sleep apnea     Past Surgical History:  Procedure Laterality Date  . club feet      Family History  Problem Relation Age of Onset  . Hypertension Mother   . Healthy Father   . Cancer Maternal Aunt     Social History:  reports that he quit smoking about 2 years ago. His smoking use included cigarettes. He has a 5.00 pack-year smoking history. He has never used smokeless tobacco. He reports that he drank alcohol. He reports that he does not use drugs.  He previously smoked 1/2 pack/day x 10 years.  He rarely uses alcohol. Patient denies known exposures to radiation on toxins.  He works in an office.  The patient is alone today.   Allergies:  Allergies  Allergen Reactions  . Cefoxitin Hives    Current Medications: Current Outpatient Medications  Medication Sig Dispense Refill  . acetaZOLAMIDE (DIAMOX) 250 MG tablet Take 1,500 mg by mouth 2 (two) times daily.    Marland Kitchen  ALPRAZolam (XANAX) 0.5 MG tablet alprazolam 0.5 mg tablet    . ELIQUIS 5 MG TABS tablet TAKE 1 TABLET BY MOUTH TWICE DAILY 120 tablet 0  . omeprazole (PRILOSEC) 20 MG capsule omeprazole 20 mg capsule,delayed release    . sertraline (ZOLOFT) 100 MG tablet sertraline 100 mg tablet     No current facility-administered medications for this visit.     Review of Systems  Constitutional: Negative for chills, diaphoresis, fever, malaise/fatigue and weight loss (up 10 pounds).       Feels "back to normal".  HENT: Negative.  Negative for congestion, ear discharge, ear pain, nosebleeds, sinus pain and sore throat.   Eyes: Negative.  Negative for blurred vision, double vision, photophobia, pain, discharge and redness.  Respiratory: Negative.  Negative for cough, hemoptysis, sputum production, shortness of breath and wheezing.        Sleep apnea on CPAP  Cardiovascular: Negative.  Negative for chest pain, palpitations, orthopnea, leg swelling and PND.  Gastrointestinal: Negative.  Negative for abdominal pain, blood in stool, constipation, diarrhea, nausea and vomiting.  Genitourinary: Negative.  Negative for dysuria, flank pain, frequency and hematuria.  Musculoskeletal: Negative.  Negative for back pain, falls, joint pain, myalgias and neck pain.  Skin: Negative.  Negative for itching and rash.  Neurological: Negative.  Negative for dizziness, tingling, tremors, sensory change, speech change, focal  weakness, weakness and headaches.  Endo/Heme/Allergies: Negative.  Does not bruise/bleed easily.  Psychiatric/Behavioral: Negative for depression, memory loss and suicidal ideas. Substance abuse:   The patient is not nervous/anxious and does not have insomnia.   All other systems reviewed and are negative.  Performance status (ECOG): 0  Physical Exam: Blood pressure (!) 144/88, pulse 75, temperature 97.9 F (36.6 C), temperature source Tympanic, resp. rate 18, weight (!) 341 lb 2 oz (154.7 kg). GENERAL:   Well developed, well nourished, gentleman sitting comfortably in the exam room in no acute distress. MENTAL STATUS:  Alert and oriented to person, place and time. HEAD:  Wearing a black cap.  Brown hair.  Normocephalic, atraumatic, face symmetric, no Cushingoid features. EYES:  Blue eyes.  Pupils equal round and reactive to light and accomodation.  No conjunctivitis or scleral icterus. ENT:  Oropharynx clear without lesion.  Tongue normal. Mucous membranes moist.  RESPIRATORY:  Clear to auscultation without rales, wheezes or rhonchi. CARDIOVASCULAR:  Regular rate and rhythm without murmur, rub or gallop. ABDOMEN:  Fully round.  Soft, non-tender, with active bowel sounds, and no appreciable hepatosplenomegaly.  No masses. SKIN:  No rashes, ulcers or lesions. EXTREMITIES: No edema, no skin discoloration or tenderness.  No palpable cords. LYMPH NODES: No palpable cervical, supraclavicular, axillary or inguinal adenopathy  NEUROLOGICAL: Unremarkable. PSYCH:  Appropriate.     No visits with results within 3 Day(s) from this visit.  Latest known visit with results is:  Appointment on 08/09/2018  Component Date Value Ref Range Status  . Protein S Activity 08/09/2018 68  63 - 140 % Final   Comment: (NOTE) Protein S activity may be falsely increased (masking an abnormal, low result) in patients receiving direct Xa inhibitor (e.g., rivaroxaban, apixaban, edoxaban) or a direct thrombin inhibitor (e.g., dabigatran) anticoagulant treatment due to assay interference by these drugs. Performed At: [facility] [place], [place] [id number] Rush Farmer MD [id number]   . Protein C, Total 08/09/2018 97  60 - 150 % Final   Comment: (NOTE) Performed At: [facility] [place], [place] [id number] Rush Farmer MD [id number]   . Protein C Activity 08/09/2018 129  73 - 180 % Final   Comment: (NOTE) Performed At: [facility] [place], [place] [id number] Rush Farmer MD [id number]   . Antithrombin Activity 08/09/2018 110  75 - 135 % Final   Comment: (NOTE) Direct Xa inhibitor anticoagulants such as rivaroxaban, apixaban and edoxaban will lead to spuriously elevated antithrombin activity levels possibly masking a deficiency.   . AT III AG PPP IMM-ACNC 08/09/2018 92  72 - 124 % Final   Comment: (NOTE) This test was developed and its performance characteristics determined by [facility]. It has not been cleared or approved by the Food and Drug Administration. Performed At: [facility] [place], [place] [id number] Rush Farmer MD [id number]   . Sodium 08/09/2018 141  135 - 145 mmol/L Final  . Potassium 08/09/2018 3.5  3.5 - 5.1 mmol/L Final  . Chloride 08/09/2018 115* 98 - 111 mmol/L Final  . CO2 08/09/2018 21* 22 - 32 mmol/L Final  . Glucose, Bld 08/09/2018 91  70 - 99 mg/dL Final  . BUN 08/09/2018 16  6 - 20 mg/dL Final  . Creatinine, Ser 08/09/2018 1.22  0.61 - 1.24 mg/dL Final  . Calcium 08/09/2018 9.0  8.9 - 10.3 mg/dL Final  . Total Protein 08/09/2018 7.3  6.5 - 8.1 g/dL Final  .  Albumin 08/09/2018 4.1  3.5 - 5.0 g/dL Final  . AST 08/09/2018 16  15 - 41 U/L Final  . ALT 08/09/2018 22  0 - 44 U/L Final  . Alkaline Phosphatase 08/09/2018 51  38 - 126 U/L Final  . Total Bilirubin 08/09/2018 0.7  0.3 - 1.2 mg/dL Final  . GFR calc non Af Amer 08/09/2018 >60  >60 mL/min Final  . GFR calc Af Amer 08/09/2018 >60  >60 mL/min Final   Comment: (NOTE) The eGFR has been calculated using the CKD EPI equation. This calculation has not been validated in all clinical situations. eGFR's persistently <60 mL/min signify possible Chronic Kidney Disease.   Georgiann Hahn gap 08/09/2018 5  5 - 15 Final   Performed at [facility], [address] [place], [place] [address]  . WBC 08/09/2018 6.7  3.8 - 10.6 K/uL Final  . RBC 08/09/2018 4.66  4.40 - 5.90 MIL/uL Final  . Hemoglobin 08/09/2018  13.8  13.0 - 18.0 g/dL Final  . HCT 08/09/2018 40.8  40.0 - 52.0 % Final  . MCV 08/09/2018 87.6  80.0 - 100.0 fL Final  . MCH 08/09/2018 29.5  26.0 - 34.0 pg Final  . MCHC 08/09/2018 33.7  32.0 - 36.0 g/dL Final  . RDW 08/09/2018 14.1  11.5 - 14.5 % Final  . Platelets 08/09/2018 243  150 - 440 K/uL Final  . Neutrophils Relative % 08/09/2018 59  % Final  . Neutro Abs 08/09/2018 3.9  1.4 - 6.5 K/uL Final  . Lymphocytes Relative 08/09/2018 34  % Final  . Lymphs Abs 08/09/2018 2.2  1.0 - 3.6 K/uL Final  . Monocytes Relative 08/09/2018 6  % Final  . Monocytes Absolute 08/09/2018 0.4  0.2 - 1.0 K/uL Final  . Eosinophils Relative 08/09/2018 1  % Final  . Eosinophils Absolute 08/09/2018 0.1  0 - 0.7 K/uL Final  . Basophils Relative 08/09/2018 0  % Final  . Basophils Absolute 08/09/2018 0.0  0 - 0.1 K/uL Final   Performed at [facility], [address] [place], [place] [address]  . Protein S Ag, Total 08/09/2018 74  60 - 150 % Final   Comment: (NOTE) This test was developed and its performance characteristics determined by [facility]. It has not been cleared or approved by the Food and Drug Administration. Performed At: [facility] [place], [place] [id number] Rush Farmer MD [id number]     Assessment:  Shane Oneill is a 37 y.o. male with a history of right lower extremity superficial thrombosis x 3 and a recent history of bilateral pulmonary emboli.  He denies any precipating events.  He sits at a desk (sedentary).  He denies any family history of thrombosis.  He is on Eliquis.  Chest CT angiogram on 04/20/2018 revealed multiple acute segmental pulmonary emboli in the lungs bilaterally.  There were small left upper lobe lingula and dependent left lower lobe pulmonary infarctions.  There was a small left pleural effusion.  Bilateral lower extremity duplex on 05/04/2018 revealed no evidence of DVT within either lower extremity.  Hypercoagulable work-up on  04/30/2018 revealed Factor V Leiden (single [id number] mutation).  Normal studies included:  prothrombin gene mutation, lupus anticoagulant panel, anticardiolipin antibodies, beta-2 glycoprotein antibodies.  Normal studies on 08/09/2018 included protein C antigen (97%) and activity (129%), protein S antigen (74%) and activity (68%) and ATIII antigen (92%) and activity (110%).  Symptomatically, he denies any complaints.  Exam is stable.  Plan: 1. Review interim  labs.  Protein C, protein S, and ATIII were normal. 2. Bilateral pulmonary emboli:  Continue Eliquis.  3. Discuss the rational for indefinite anticoagulation. He has has had recurrent superficial thrombophlebitis and recent significant bilateral pulmonary emboli. The estimated risk of recurrence following discontinuation of anticoagulation after a first unprovoked episode of VTE is 10% at one year and 30% at five years (5% per year after the first year).  The decrease in recurrence outweighs the rate of bleeding with full anticoagulation in most patients who are estimated to have a low (0.8% per year) or intermediate (1.6% per year) bleeding risk.  In the AMPLIFT-EXT study, the risk of major bleeding for Eliquis was 0.1 - 0.2%. 4.  RTC in 6 months for MD assessment and labs (CBC with diff, CMP, D-dimer).   Lequita Asal, MD  08/16/2018, 3:37 PM

## 2018-08-17 ENCOUNTER — Encounter: Payer: Self-pay | Admitting: Family Medicine

## 2018-08-17 NOTE — Assessment & Plan Note (Signed)
Continue eliquis. Continue to follow with hematology. Call with any concerns. Continue to monitor. Call with any concerns.

## 2018-09-07 ENCOUNTER — Telehealth: Payer: Self-pay | Admitting: Family Medicine

## 2018-09-07 NOTE — Telephone Encounter (Signed)
Chart updated

## 2018-09-07 NOTE — Telephone Encounter (Signed)
Copied from CRM [id number]. Topic: General - Other >> Sep 07, 2018  2:14 PM Leafy Ro wrote: Reason for CRM:pt wife Tresa Endo is calling her husband got a flu shot at work 09-05-18

## 2018-09-12 ENCOUNTER — Ambulatory Visit: Payer: BLUE CROSS/BLUE SHIELD | Admitting: Family Medicine

## 2018-09-18 IMAGING — CT CT ANGIO CHEST
2 of 6 series · 19 of 36 positions shown · IV contrast (iopamidol)
Comparison: 04/20/2018 chest radiograph

CLINICAL DATA: 36 y/o M; left chest pain, shortness of breath,
cough. History of PE. Elevated D-dimer.

EXAM:
CT ANGIOGRAPHY CHEST WITH CONTRAST
TECHNIQUE: Multidetector CT imaging of the chest was performed using the
standard protocol during bolus administration of intravenous
contrast. Multiplanar CT image reconstructions and MIPs were
obtained to evaluate the vascular anatomy.
CONTRAST:  100mL K7DRHX-C47 IOPAMIDOL ([id number]) INJECTION 76%

[Series 7: thins · axial · 0.78mm/px · z∈[-323,-25]mm · 18 of 332 slices shown]
[im 17/332  lung]
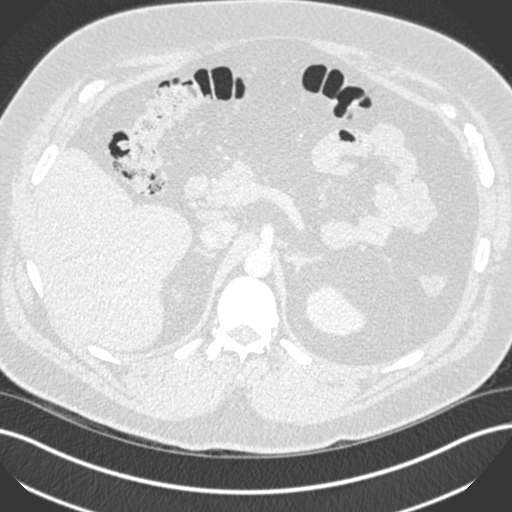
[im 34/332  mediastinal]
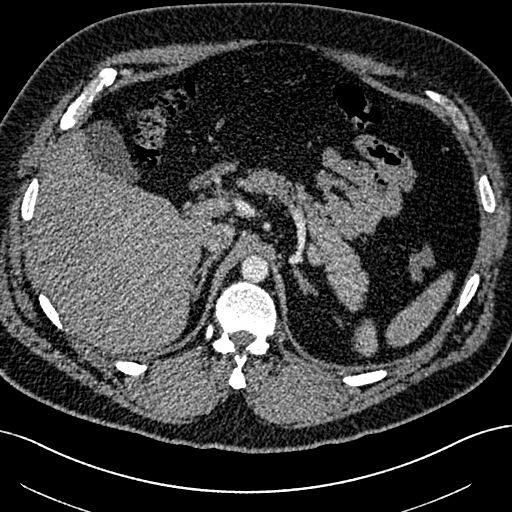
[im 50/332  lung]
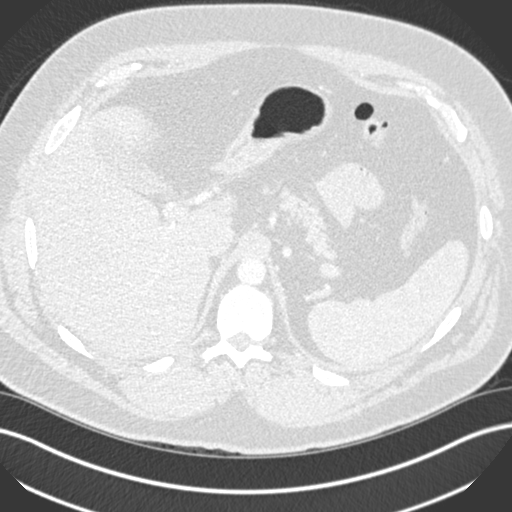
[im 67/332  mediastinal]
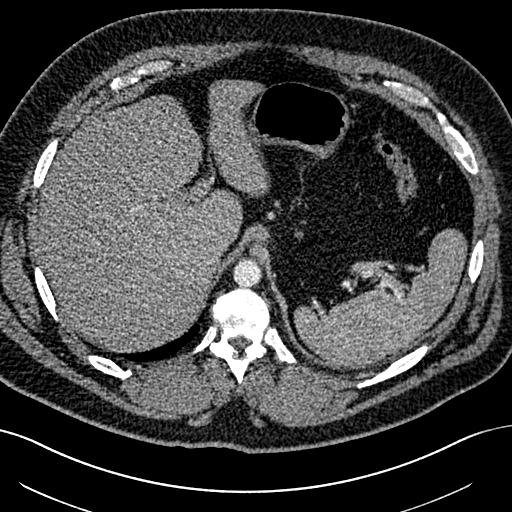
[im 83/332  lung]
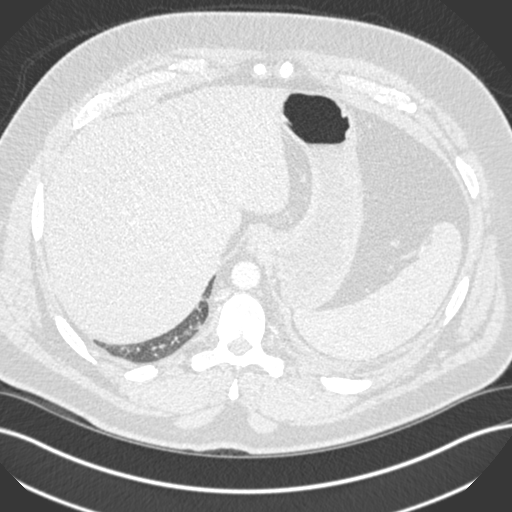
[im 100/332  mediastinal]
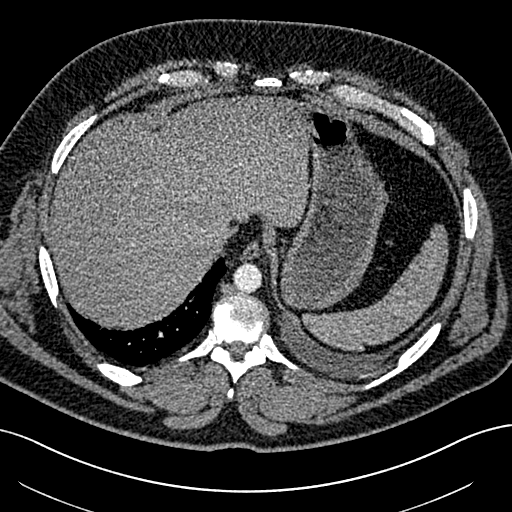
[im 116/332  lung]
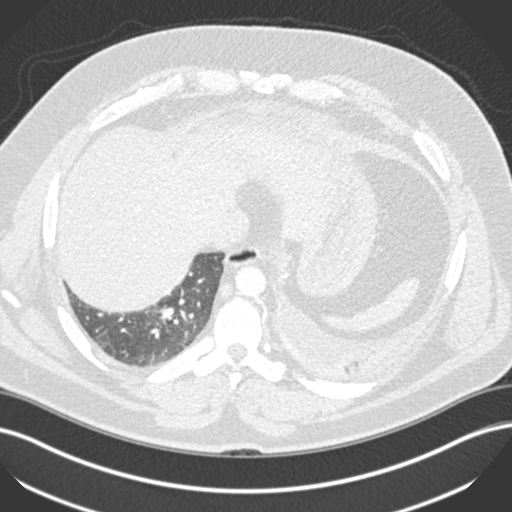
[im 133/332  mediastinal]
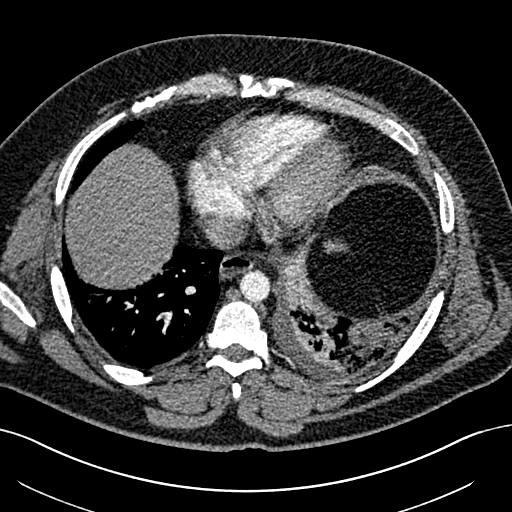
[im 149/332  lung]
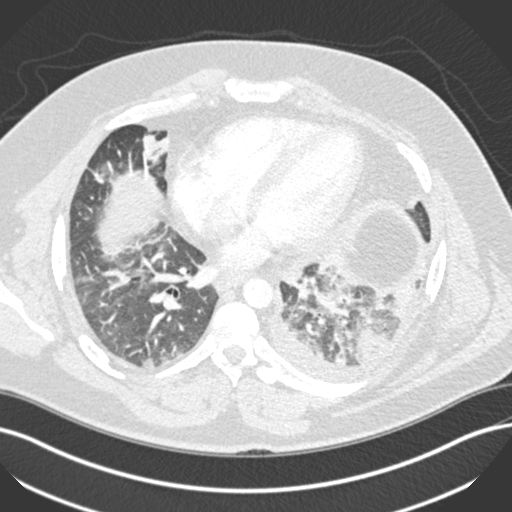
[im 183/332  mediastinal]
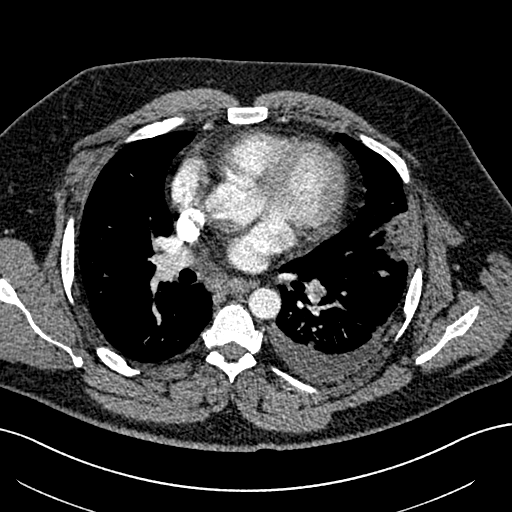
[im 199/332  lung]
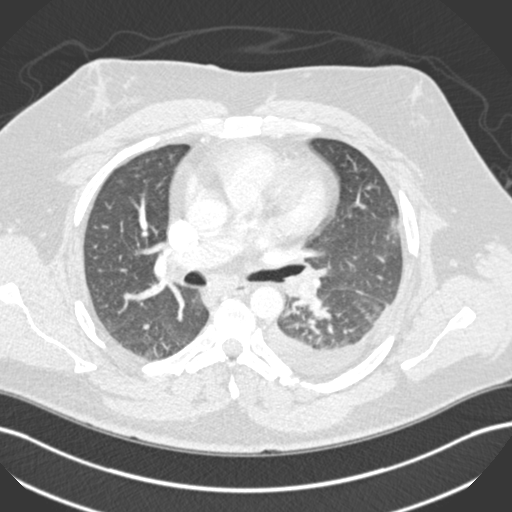
[im 216/332  mediastinal]
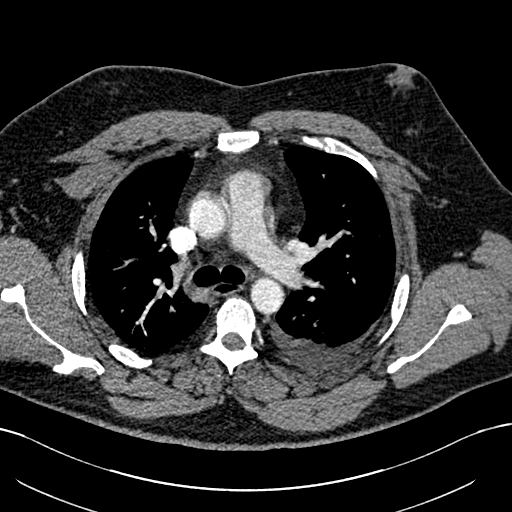
[im 232/332  lung]
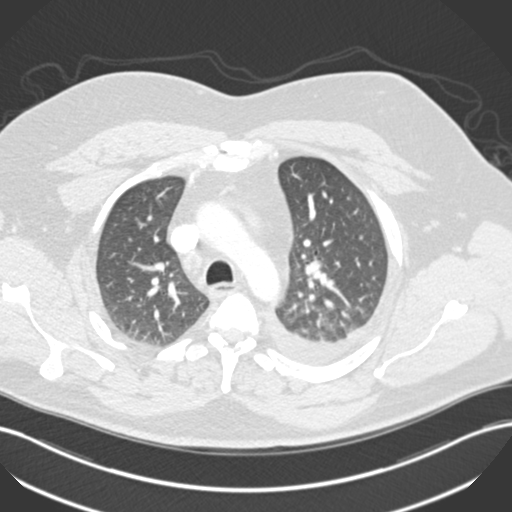
[im 249/332  mediastinal]
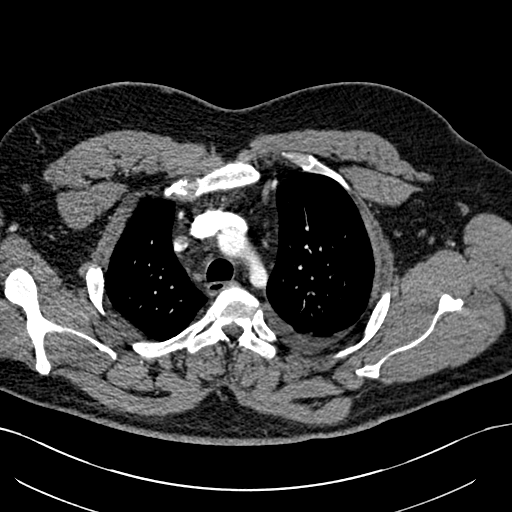
[im 265/332  lung]
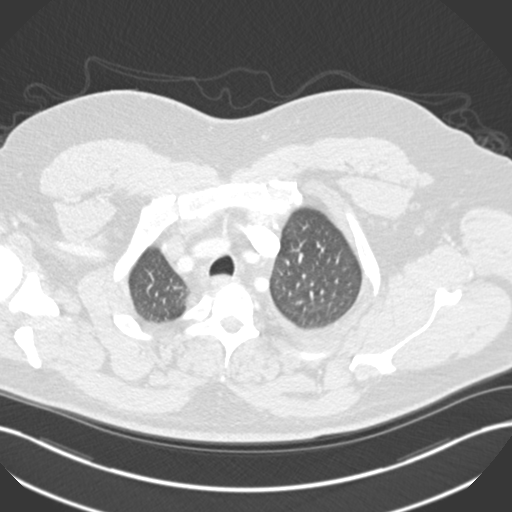
[im 282/332  mediastinal]
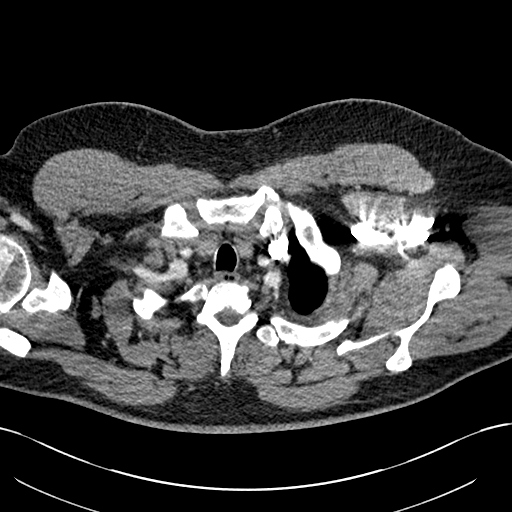
[im 298/332  lung]
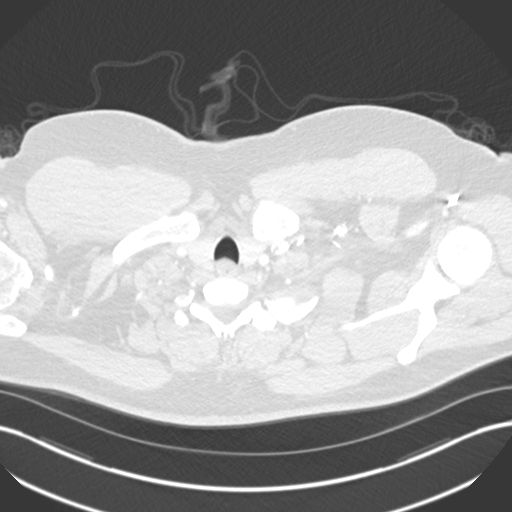
[im 315/332  mediastinal]
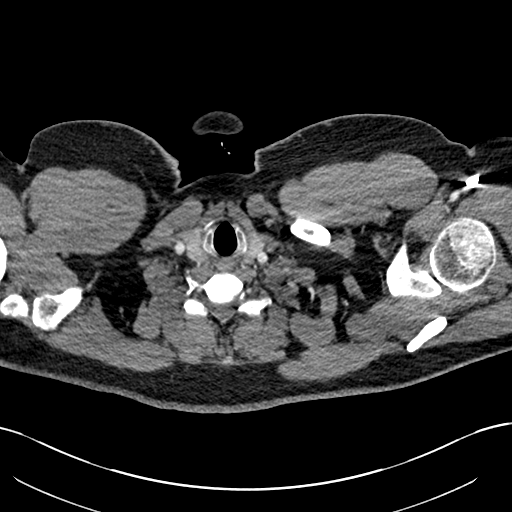

[Series 9: coronal mpr · coronal · 0.71mm/px · 1 of 106 slices shown]
[im 53/106  mediastinal]
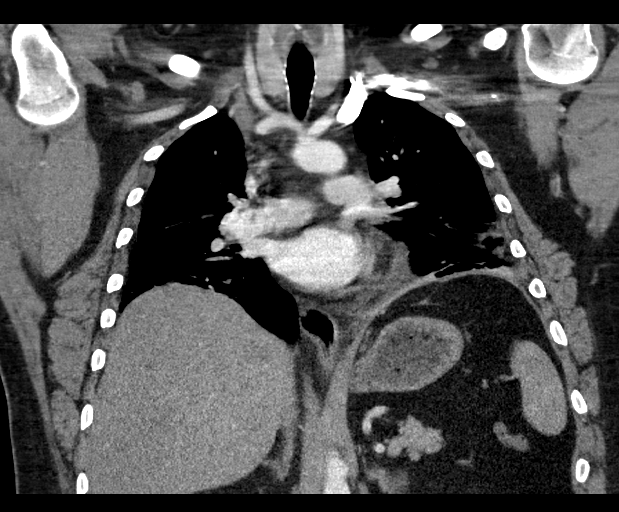

[19 of 36 positions shown; findings below may reference images not displayed]

FINDINGS: Cardiovascular: Multiple bilateral segmental acute pulmonary emboli.
No findings of right heart strain. Normal caliber thoracic aorta and
main pulmonary artery.

Mediastinum/Nodes: No enlarged mediastinal, hilar, or axillary lymph
nodes. Thyroid gland, trachea, and esophagus demonstrate no
significant findings.

Lungs/Pleura: Small left pleural effusion. Wedge-shaped foci of
consolidation within the left upper lobe lingula and left lower lobe
with central lucency are compatible with pulmonary infarction.

Upper Abdomen: No acute abnormality.

Musculoskeletal: No chest wall abnormality. No acute or significant
osseous findings.

Review of the MIP images confirms the above findings.
IMPRESSION: 1. Multiple acute segmental pulmonary emboli in the lungs
bilaterally.
2. Small left upper lobe lingula and dependent left lower lobe
pulmonary infarctions.
3. Small left pleural effusion.

Critical Value/emergent results were called by telephone at the time
of interpretation on 04/20/2018 at [DATE] to Dr. Huiling, who
verbally acknowledged these results.

By: Michela Gilliard M.D.

## 2018-10-02 ENCOUNTER — Encounter: Payer: Self-pay | Admitting: Hematology and Oncology

## 2018-10-02 IMAGING — US US EXTREM LOW VENOUS BILAT
1 series · 13 of 24 positions shown · non-contrast
Comparison: Chest CT - 04/20/2018

CLINICAL DATA: Pulmonary embolism. Former smoker. Evaluate for DVT.



[Series 1: us extrem low venous bilat · 13 of 61 slices shown]
[im 1/61]
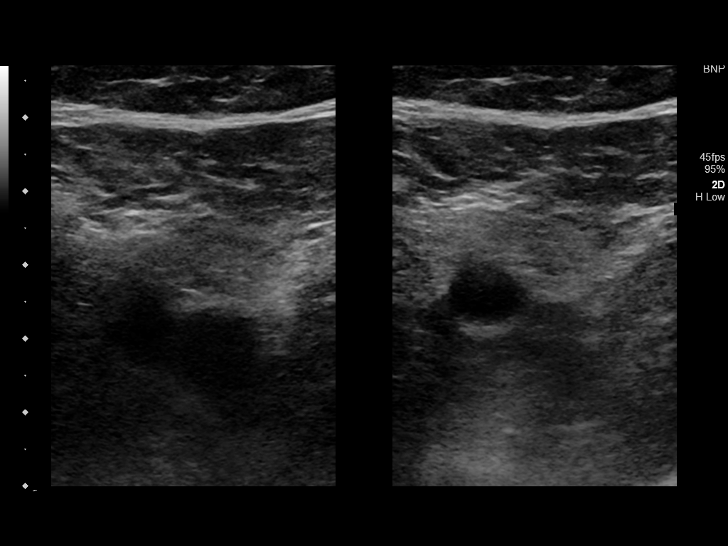
[im 6/61]
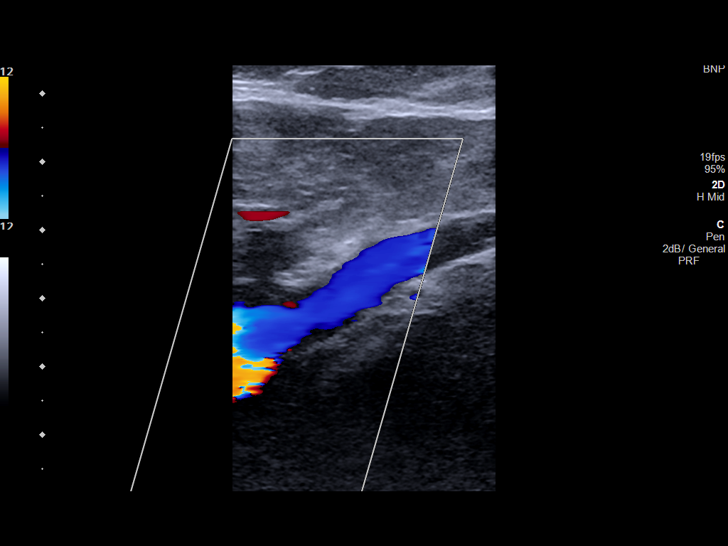
[im 11/61]
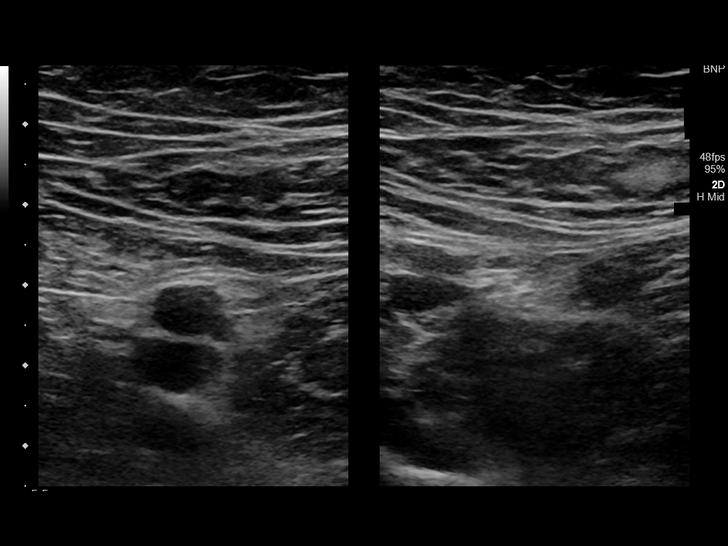
[im 16/61]
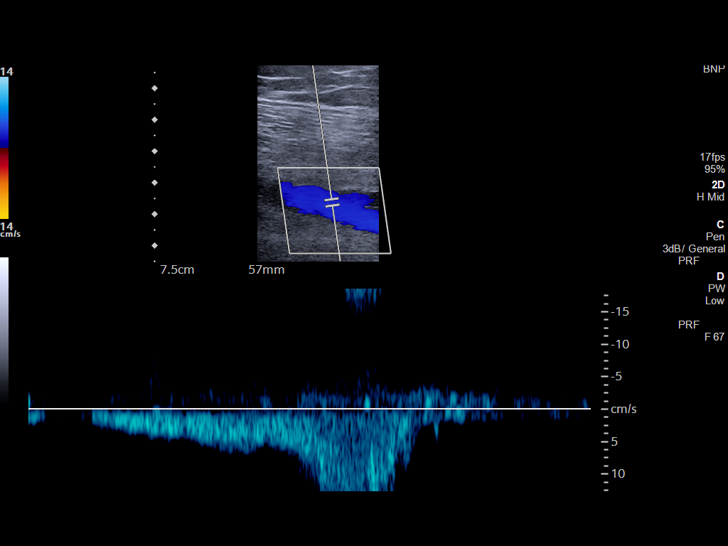
[im 21/61]
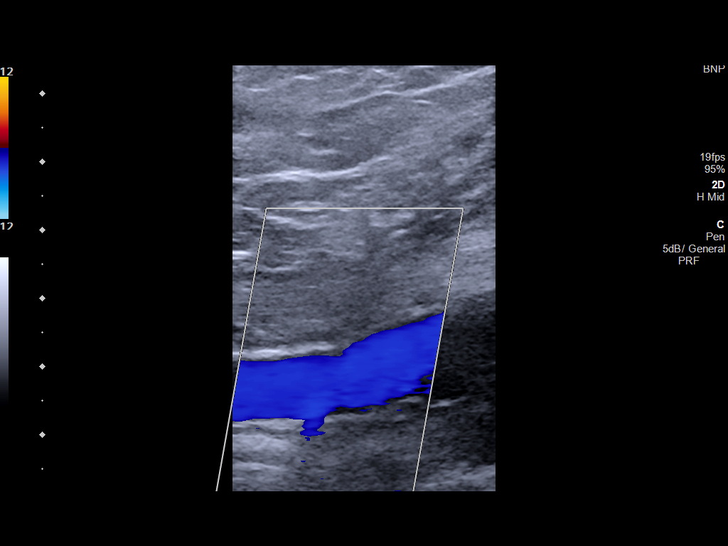
[im 27/61]
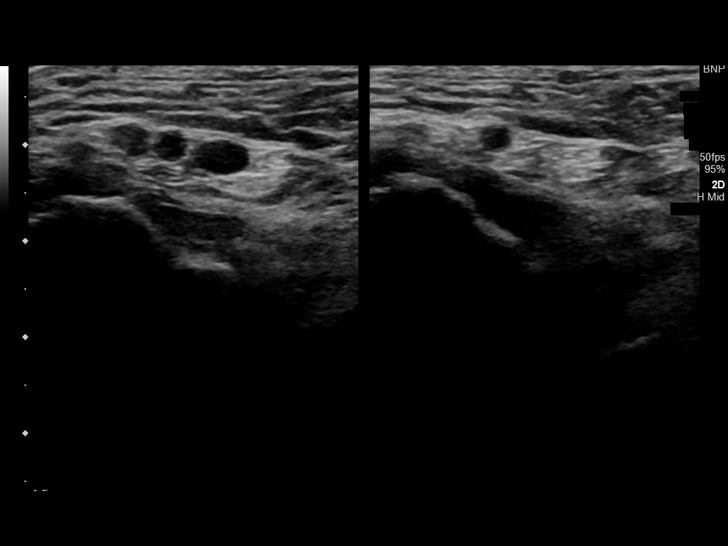
[im 32/61]
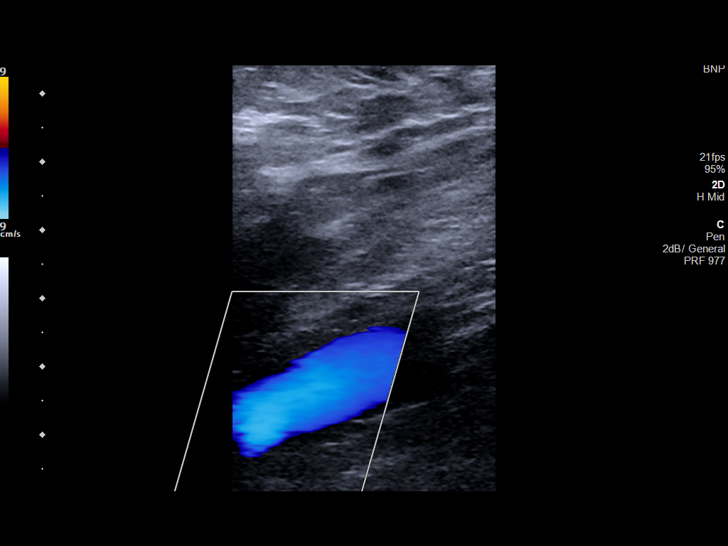
[im 34/61]
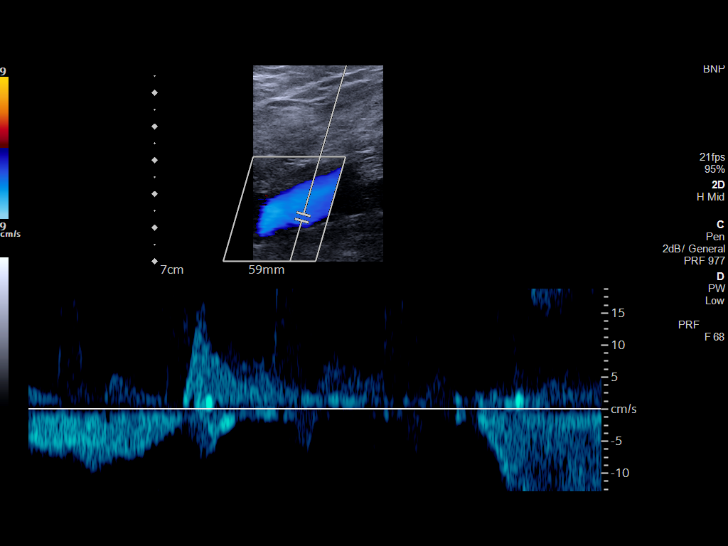
[im 40/61]
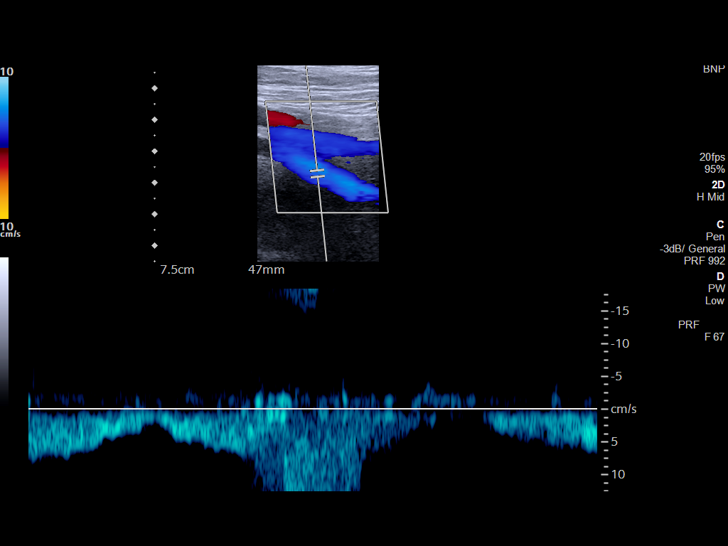
[im 45/61]
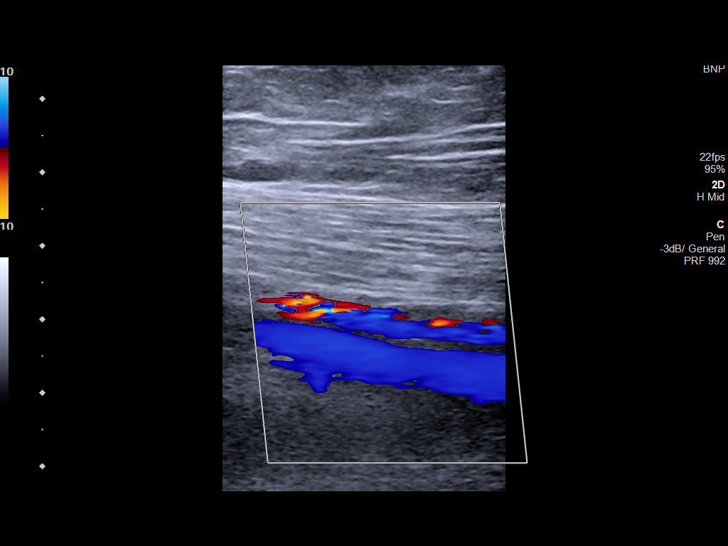
[im 50/61]
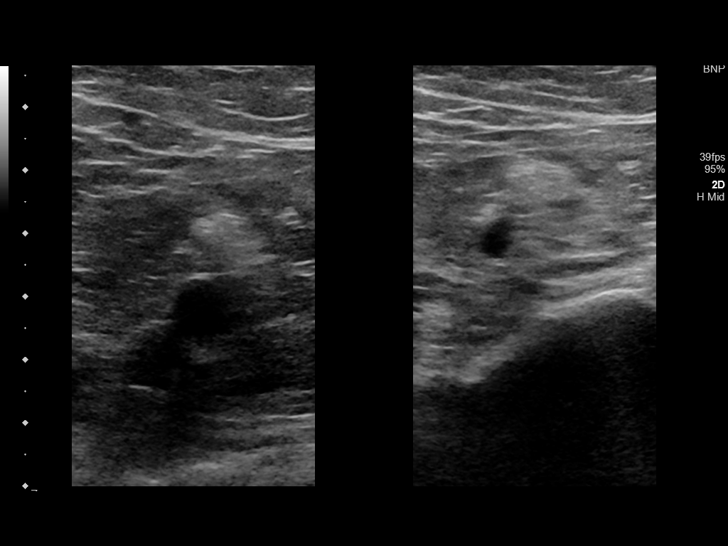
[im 55/61]
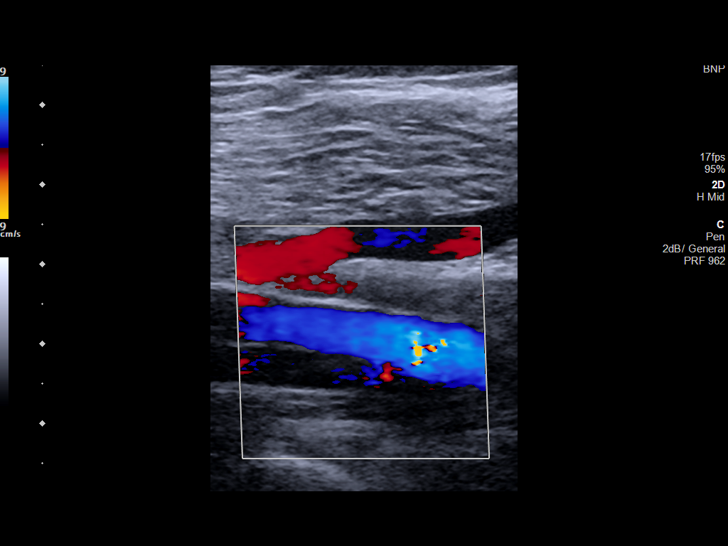
[im 61/61]
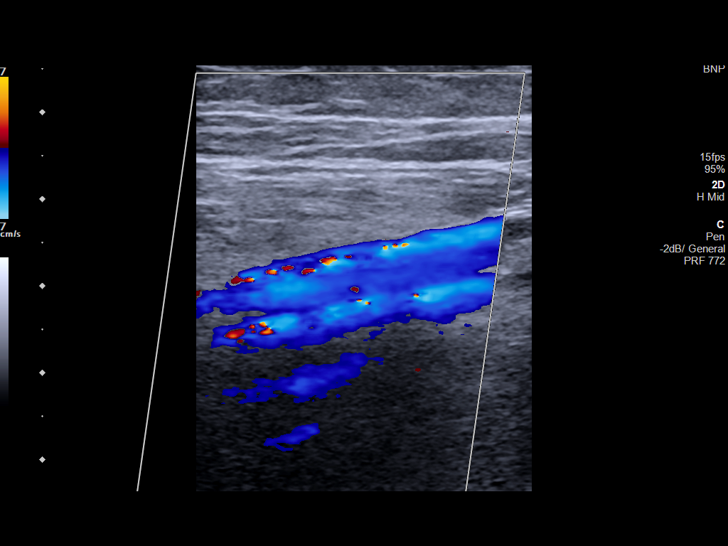

[13 of 24 positions shown; findings below may reference images not displayed]

FINDINGS: RIGHT LOWER EXTREMITY

Common Femoral Vein: No evidence of thrombus. Normal
compressibility, respiratory phasicity and response to augmentation.

Saphenofemoral Junction: No evidence of thrombus. Normal
compressibility and flow on color Doppler imaging.

Profunda Femoral Vein: No evidence of thrombus. Normal
compressibility and flow on color Doppler imaging.

Femoral Vein: No evidence of thrombus. Normal compressibility,
respiratory phasicity and response to augmentation.

Popliteal Vein: No evidence of thrombus. Normal compressibility,
respiratory phasicity and response to augmentation.

Calf Veins: No evidence of thrombus. Normal compressibility and flow
on color Doppler imaging.

Superficial Great Saphenous Vein: No evidence of thrombus. Normal
compressibility.

Venous Reflux:  None.

Other Findings:  None.

LEFT LOWER EXTREMITY

Common Femoral Vein: No evidence of thrombus. Normal
compressibility, respiratory phasicity and response to augmentation.

Saphenofemoral Junction: No evidence of thrombus. Normal
compressibility and flow on color Doppler imaging.

Profunda Femoral Vein: No evidence of thrombus. Normal
compressibility and flow on color Doppler imaging.

Femoral Vein: No evidence of thrombus. Normal compressibility,
respiratory phasicity and response to augmentation.

Popliteal Vein: No evidence of thrombus. Normal compressibility,
respiratory phasicity and response to augmentation.

Calf Veins: No evidence of thrombus. Normal compressibility and flow
on color Doppler imaging.

Superficial Great Saphenous Vein: No evidence of thrombus. Normal
compressibility.

Venous Reflux:  None.

Other Findings:  None.
IMPRESSION: No evidence of DVT within either lower extremity.

## 2018-10-02 MED ORDER — APIXABAN 5 MG PO TABS: 5.0000 mg | ORAL_TABLET | Freq: Two times a day (BID) | ORAL | 2 refills | Status: DC

## 2018-10-03 ENCOUNTER — Encounter: Payer: Self-pay | Admitting: Family Medicine

## 2018-10-03 ENCOUNTER — Ambulatory Visit (INDEPENDENT_AMBULATORY_CARE_PROVIDER_SITE_OTHER): Payer: BLUE CROSS/BLUE SHIELD | Admitting: Family Medicine

## 2018-10-03 VITALS — BP 119/78 | HR 67 | Temp 97.8°F | Ht 71.0 in | Wt 332.4 lb

## 2018-10-03 DIAGNOSIS — F419 Anxiety disorder, unspecified: Secondary | ICD-10-CM | POA: Diagnosis not present

## 2018-10-03 DIAGNOSIS — Z79899 Other long term (current) drug therapy: Secondary | ICD-10-CM | POA: Insufficient documentation

## 2018-10-03 DIAGNOSIS — G4733 Obstructive sleep apnea (adult) (pediatric): Secondary | ICD-10-CM

## 2018-10-03 DIAGNOSIS — K219 Gastro-esophageal reflux disease without esophagitis: Secondary | ICD-10-CM | POA: Insufficient documentation

## 2018-10-03 DIAGNOSIS — Z9989 Dependence on other enabling machines and devices: Secondary | ICD-10-CM

## 2018-10-03 MED ORDER — ALPRAZOLAM 0.5 MG PO TABS: ORAL_TABLET | ORAL | 0 refills | Status: DC

## 2018-10-03 MED ORDER — OMEPRAZOLE 20 MG PO CPDR: 20.0000 mg | DELAYED_RELEASE_CAPSULE | Freq: Every day | ORAL | 3 refills | Status: DC

## 2018-10-03 MED ORDER — SERTRALINE HCL 100 MG PO TABS: ORAL_TABLET | ORAL | 1 refills | Status: DC

## 2018-10-03 NOTE — Assessment & Plan Note (Signed)
Stable on current regimen. Takes xanax very occasionally. 30 pills should last him a year. Call with any concerns. Continue to monitor. Refills given today.

## 2018-10-03 NOTE — Assessment & Plan Note (Signed)
Needs new machine and new sleep study. Will get set up. Order placed. Call with any concerns.

## 2018-10-03 NOTE — Assessment & Plan Note (Signed)
Under good control on current regimen. Continue current regimen. Continue to monitor. Call with any concerns. Refills given.   

## 2018-10-03 NOTE — Assessment & Plan Note (Signed)
For xanax- see scanned document.  

## 2018-10-03 NOTE — Progress Notes (Signed)
BP 119/78 (BP Location: Left Arm, Patient Position: Sitting, Cuff Size: Large)   Pulse 67   Temp 97.8 F (36.6 C)   Ht 5\' 11"  (1.803 m)   Wt (!) 332 lb 6 oz (150.8 kg)   SpO2 98%   BMI 46.36 kg/m    Subjective:    Patient ID: Shane Oneill, male    DOB: 10-10-1981, 37 y.o.   MRN: [id number]  HPI: Shane Oneill is a 37 y.o. male  Chief Complaint  Patient presents with  . Sleep Apnea    Patient would like to a new cpap machine  . Anxiety    Refill  . Gastroesophageal Reflux    Refill   SLEEP APNEA- originally got his CPAP through [facility] Sleep apnea status: controlled Duration: 5 years Satisfied with current treatment?:  yes CPAP use:  yes Sleep quality with CPAP use: excellent Treament compliance:excellent compliance Last sleep study: 5 years ago Wakes feeling refreshed:  yes Daytime hypersomnolence:  no Fatigue:  yes Insomnia:  no Good sleep hygiene:  yes Difficulty falling asleep:  no Difficulty staying asleep:  yes Snoring bothers bed partner:  no Observed apnea by bed partner: no Obesity:  yes Hypertension: no  Pulmonary hypertension:  no Coronary artery disease:  no  ANXIETY/STRESS- takes xanax occasionally with severe symptoms. Ha Duration:controlled Anxious mood: yes  Excessive worrying: no Irritability: no  Sweating: no Nausea: no Palpitations:no Hyperventilation: no Panic attacks: no Agoraphobia: no  Obscessions/compulsions: no Depressed mood: no Depression screen [facility] 2/9 10/03/2018 08/15/2018  Decreased Interest 0 0  Down, Depressed, Hopeless 0 0  PHQ - 2 Score 0 0  Altered sleeping 0 0  Tired, decreased energy 0 0  Change in appetite 0 0  Feeling bad or failure about yourself  0 0  Trouble concentrating 0 0  Moving slowly or fidgety/restless 0 0  Suicidal thoughts 0 0  PHQ-9 Score 0 0  Difficult doing work/chores Not difficult at all Not difficult at all   GAD 7 : Generalized Anxiety Score 10/03/2018 08/15/2018  Nervous, Anxious, on Edge 1  1  Control/stop worrying 0 0  Worry too much - different things 0 0  Trouble relaxing 0 0  Restless 0 0  Easily annoyed or irritable 1 1  Afraid - awful might happen 0 0  Total GAD 7 Score 2 2  Anxiety Difficulty Not difficult at all Not difficult at all   Anhedonia: no Weight changes: no Insomnia: occasionally   Hypersomnia: no Fatigue/loss of energy: no Feelings of worthlessness: no Feelings of guilt: no Impaired concentration/indecisiveness: no Suicidal ideations: no  Crying spells: no Recent Stressors/Life Changes: no   Relationship problems: no   Family stress: no     Financial stress: no    Job stress: no    Recent death/loss: no  GERD GERD control status: controlled  Satisfied with current treatment? yes Heartburn frequency:  Medication side effects: no  Medication compliance: stable Dysphagia: no Odynophagia:  no Hematemesis: no Blood in stool: no EGD: no   Relevant past medical, surgical, family and social history reviewed and updated as indicated. Interim medical history since our last visit reviewed. Allergies and medications reviewed and updated.  Review of Systems  Constitutional: Negative.   Respiratory: Negative.   Cardiovascular: Negative.   Neurological: Negative.   Psychiatric/Behavioral: Negative.     Per HPI unless specifically indicated above     Objective:    BP 119/78 (BP Location: Left Arm, Patient Position: Sitting, Cuff Size:  Large)   Pulse 67   Temp 97.8 F (36.6 C)   Ht 5\' 11"  (1.803 m)   Wt (!) 332 lb 6 oz (150.8 kg)   SpO2 98%   BMI 46.36 kg/m   Wt Readings from Last 3 Encounters:  10/03/18 (!) 332 lb 6 oz (150.8 kg)  08/16/18 (!) 341 lb 2 oz (154.7 kg)  08/15/18 (!) 331 lb (150.1 kg)    Physical Exam  Constitutional: He is oriented to person, place, and time. He appears well-developed and well-nourished. No distress.  HENT:  Head: Normocephalic and atraumatic.  Right Ear: Hearing normal.  Left Ear: Hearing  normal.  Nose: Nose normal.  Eyes: Conjunctivae and lids are normal. Right eye exhibits no discharge. Left eye exhibits no discharge. No scleral icterus.  Cardiovascular: Normal rate, regular rhythm, normal heart sounds and intact distal pulses. Exam reveals no gallop and no friction rub.  No murmur heard. Pulmonary/Chest: Effort normal and breath sounds normal. No stridor. No respiratory distress. He has no wheezes. He has no rales. He exhibits no tenderness.  Musculoskeletal: Normal range of motion.  Neurological: He is alert and oriented to person, place, and time.  Skin: Skin is warm, dry and intact. Capillary refill takes less than 2 seconds. No rash noted. He is not diaphoretic. No erythema. No pallor.  Psychiatric: He has a normal mood and affect. His speech is normal and behavior is normal. Judgment and thought content normal. Cognition and memory are normal.  Nursing note and vitals reviewed.   Results for orders placed or performed in visit on 08/09/18  Protein S activity  Result Value Ref Range   Protein S Activity 68 63 - 140 %  Protein C, total  Result Value Ref Range   Protein C, Total 97 60 - 150 %  Protein C activity  Result Value Ref Range   Protein C Activity 129 73 - 180 %  Antithrombin panel  Result Value Ref Range   Antithrombin Activity 110 75 - 135 %   AT III AG PPP IMM-ACNC 92 72 - 124 %  Comprehensive metabolic panel  Result Value Ref Range   Sodium 141 135 - 145 mmol/L   Potassium 3.5 3.5 - 5.1 mmol/L   Chloride 115 (H) 98 - 111 mmol/L   CO2 21 (L) 22 - 32 mmol/L   Glucose, Bld 91 70 - 99 mg/dL   BUN 16 6 - 20 mg/dL   Creatinine, Ser 4.09 0.61 - 1.24 mg/dL   Calcium 9.0 8.9 - 81.1 mg/dL   Total Protein 7.3 6.5 - 8.1 g/dL   Albumin 4.1 3.5 - 5.0 g/dL   AST 16 15 - 41 U/L   ALT 22 0 - 44 U/L   Alkaline Phosphatase 51 38 - 126 U/L   Total Bilirubin 0.7 0.3 - 1.2 mg/dL   GFR calc non Af Amer >60 >60 mL/min   GFR calc Af Amer >60 >60 mL/min   Anion  gap 5 5 - 15  CBC with Differential/Platelet  Result Value Ref Range   WBC 6.7 3.8 - 10.6 K/uL   RBC 4.66 4.40 - 5.90 MIL/uL   Hemoglobin 13.8 13.0 - 18.0 g/dL   HCT 91.4 78.2 - 95.6 %   MCV 87.6 80.0 - 100.0 fL   MCH 29.5 26.0 - 34.0 pg   MCHC 33.7 32.0 - 36.0 g/dL   RDW 21.3 08.6 - 57.8 %   Platelets 243 150 - 440 K/uL   Neutrophils Relative %  59 %   Neutro Abs 3.9 1.4 - 6.5 K/uL   Lymphocytes Relative 34 %   Lymphs Abs 2.2 1.0 - 3.6 K/uL   Monocytes Relative 6 %   Monocytes Absolute 0.4 0.2 - 1.0 K/uL   Eosinophils Relative 1 %   Eosinophils Absolute 0.1 0 - 0.7 K/uL   Basophils Relative 0 %   Basophils Absolute 0.0 0 - 0.1 K/uL  Protein S, total  Result Value Ref Range   Protein S Ag, Total 74 60 - 150 %      Assessment & Plan:   Problem List Items Addressed This Visit      Respiratory   OSA (obstructive sleep apnea)    Needs new machine and new sleep study. Will get set up. Order placed. Call with any concerns.       Relevant Orders   Ambulatory referral to Sleep Studies     Digestive   Gastroesophageal reflux disease    Under good control on current regimen. Continue current regimen. Continue to monitor. Call with any concerns. Refills given.        Relevant Medications   omeprazole (PRILOSEC) 20 MG capsule     Other   Anxiety    Stable on current regimen. Takes xanax very occasionally. 30 pills should last him a year. Call with any concerns. Continue to monitor. Refills given today.      Relevant Medications   ALPRAZolam (XANAX) 0.5 MG tablet   sertraline (ZOLOFT) 100 MG tablet   Controlled substance agreement signed    For xanax- see scanned document.       Other Visit Diagnoses    OSA on CPAP    -  Primary   Needs new machine and new sleep study. Will get set up. Order placed. Call with any concerns.    Relevant Orders   Ambulatory referral to Sleep Studies       Follow up plan: Return if symptoms worsen or fail to improve.

## 2018-10-12 DIAGNOSIS — H40003 Preglaucoma, unspecified, bilateral: Secondary | ICD-10-CM | POA: Diagnosis not present

## 2018-10-23 DIAGNOSIS — G4733 Obstructive sleep apnea (adult) (pediatric): Secondary | ICD-10-CM | POA: Diagnosis not present

## 2018-11-01 DIAGNOSIS — G932 Benign intracranial hypertension: Secondary | ICD-10-CM | POA: Diagnosis not present

## 2018-11-01 DIAGNOSIS — H04123 Dry eye syndrome of bilateral lacrimal glands: Secondary | ICD-10-CM | POA: Diagnosis not present

## 2018-11-01 DIAGNOSIS — D3132 Benign neoplasm of left choroid: Secondary | ICD-10-CM | POA: Diagnosis not present

## 2018-11-01 DIAGNOSIS — H40003 Preglaucoma, unspecified, bilateral: Secondary | ICD-10-CM | POA: Diagnosis not present

## 2018-11-07 DIAGNOSIS — G4733 Obstructive sleep apnea (adult) (pediatric): Secondary | ICD-10-CM | POA: Diagnosis not present

## 2018-11-18 ENCOUNTER — Encounter: Payer: Self-pay | Admitting: Hematology and Oncology

## 2018-12-08 DIAGNOSIS — G4733 Obstructive sleep apnea (adult) (pediatric): Secondary | ICD-10-CM | POA: Diagnosis not present

## 2018-12-17 ENCOUNTER — Encounter: Payer: Self-pay | Admitting: Family Medicine

## 2018-12-19 DIAGNOSIS — Z79899 Other long term (current) drug therapy: Secondary | ICD-10-CM | POA: Diagnosis not present

## 2018-12-19 DIAGNOSIS — Z6841 Body Mass Index (BMI) 40.0 and over, adult: Secondary | ICD-10-CM | POA: Diagnosis not present

## 2018-12-19 DIAGNOSIS — G932 Benign intracranial hypertension: Secondary | ICD-10-CM | POA: Diagnosis not present

## 2018-12-19 DIAGNOSIS — H4710 Unspecified papilledema: Secondary | ICD-10-CM | POA: Diagnosis not present

## 2018-12-19 DIAGNOSIS — K219 Gastro-esophageal reflux disease without esophagitis: Secondary | ICD-10-CM | POA: Diagnosis not present

## 2018-12-19 DIAGNOSIS — F419 Anxiety disorder, unspecified: Secondary | ICD-10-CM | POA: Diagnosis not present

## 2018-12-19 DIAGNOSIS — Z5181 Encounter for therapeutic drug level monitoring: Secondary | ICD-10-CM | POA: Diagnosis not present

## 2018-12-19 DIAGNOSIS — Z87891 Personal history of nicotine dependence: Secondary | ICD-10-CM | POA: Diagnosis not present

## 2018-12-19 DIAGNOSIS — Z7901 Long term (current) use of anticoagulants: Secondary | ICD-10-CM | POA: Diagnosis not present

## 2019-01-08 DIAGNOSIS — G4733 Obstructive sleep apnea (adult) (pediatric): Secondary | ICD-10-CM | POA: Diagnosis not present

## 2019-01-30 DIAGNOSIS — H40003 Preglaucoma, unspecified, bilateral: Secondary | ICD-10-CM | POA: Diagnosis not present

## 2019-01-30 DIAGNOSIS — D3132 Benign neoplasm of left choroid: Secondary | ICD-10-CM | POA: Diagnosis not present

## 2019-01-30 DIAGNOSIS — H04123 Dry eye syndrome of bilateral lacrimal glands: Secondary | ICD-10-CM | POA: Diagnosis not present

## 2019-01-30 DIAGNOSIS — G932 Benign intracranial hypertension: Secondary | ICD-10-CM | POA: Diagnosis not present

## 2019-02-06 DIAGNOSIS — G4733 Obstructive sleep apnea (adult) (pediatric): Secondary | ICD-10-CM | POA: Diagnosis not present

## 2019-02-14 ENCOUNTER — Inpatient Hospital Stay: Payer: BLUE CROSS/BLUE SHIELD

## 2019-02-14 ENCOUNTER — Encounter: Payer: BLUE CROSS/BLUE SHIELD | Admitting: Family Medicine

## 2019-02-14 ENCOUNTER — Other Ambulatory Visit: Payer: BLUE CROSS/BLUE SHIELD

## 2019-02-14 ENCOUNTER — Inpatient Hospital Stay: Payer: BLUE CROSS/BLUE SHIELD | Admitting: Urgent Care

## 2019-02-14 ENCOUNTER — Ambulatory Visit: Payer: BLUE CROSS/BLUE SHIELD | Admitting: Hematology and Oncology

## 2019-02-20 ENCOUNTER — Encounter: Payer: BLUE CROSS/BLUE SHIELD | Admitting: Family Medicine

## 2019-02-20 NOTE — Progress Notes (Signed)
[facility]     [address], [address]     [place], [place] [address]     Phone: [phone]      Fax: [phone]       Clinic day:  02/21/2019  Chief Complaint: Shane Oneill is a 38 y.o. male with pulmonary embolism and heterozygosity for factor V Leiden who is seen for 6 month assessment.  HPI:  The patient was last seen in the hematology clinic on 08/16/2018.  At that time, he denied any complaints.  Exam was stable.  CBC was normal.  Creatinine was 1.22.  ATIII antigen was 92% and activity was 110%.  Protein C total 97% and functional 129%.  Protein S total 74% and functional 68%.  During the interim, he notes intermittent left axillary/chest wall pain.  Discomfort described as "dull ache".  He denies any trauma.  He denies any shortness of breath or pleuritic pain.  He denies any hemoptysis.  He denies any lower extremity edema.   Past Medical History:  Diagnosis Date  . Anxiety   . Blood clot in vein   . Idiopathic intracranial hypertension   . Sleep apnea     Past Surgical History:  Procedure Laterality Date  . club feet      Family History  Problem Relation Age of Onset  . Hypertension Mother   . Healthy Father   . Cancer Maternal Aunt     Social History:  reports that he quit smoking about 3 years ago. His smoking use included cigarettes. He has a 5.00 pack-year smoking history. He has never used smokeless tobacco. He reports previous alcohol use. He reports that he does not use drugs.  He previously smoked 1/2 pack/day x 10 years.  He rarely uses alcohol. Patient denies known exposures to radiation on toxins.  He works in an office.  The patient is alone today.   Allergies:  Allergies  Allergen Reactions  . Cefoxitin Hives    Current Medications: Current Outpatient Medications  Medication Sig Dispense Refill  . acetaZOLAMIDE (DIAMOX) 250 MG tablet Take 1,500 mg by mouth 2 (two) times daily.    Marland Kitchen ALPRAZolam (XANAX) 0.5 MG tablet  alprazolam 0.5 mg tablet daily PRN 30 tablet 0  . apixaban (ELIQUIS) 5 MG TABS tablet Take 1 tablet (5 mg total) by mouth 2 (two) times daily. 120 tablet 2  . omeprazole (PRILOSEC) 20 MG capsule Take 1 capsule (20 mg total) by mouth daily. 90 capsule 3  . sertraline (ZOLOFT) 100 MG tablet sertraline 100 mg tablet daily 90 tablet 1   No current facility-administered medications for this visit.     Review of Systems  Constitutional: Positive for weight loss (3 pounds). Negative for chills, diaphoresis, fever and malaise/fatigue.       Feels "ok".  HENT: Negative.  Negative for congestion, ear discharge, ear pain, nosebleeds, sinus pain, sore throat and tinnitus.   Eyes: Negative.  Negative for blurred vision, double vision, photophobia, pain, discharge and redness.  Respiratory: Negative.  Negative for cough, hemoptysis, sputum production, shortness of breath and wheezing.        Sleep apnea on CPAP.  Cardiovascular: Negative.  Negative for chest pain, palpitations, orthopnea, leg swelling and PND.  Gastrointestinal: Negative.  Negative for abdominal pain, blood in stool, constipation, diarrhea, heartburn, nausea and vomiting.  Genitourinary: Negative.  Negative for dysuria, flank pain, frequency, hematuria and urgency.  Musculoskeletal: Negative.  Negative for back pain, falls, joint pain, myalgias  and neck pain.       Left sided chest wall pain, intermittent.  Skin: Negative.  Negative for itching and rash.  Neurological: Negative.  Negative for dizziness, tingling, tremors, sensory change, speech change, focal weakness, weakness and headaches.  Endo/Heme/Allergies: Negative.  Does not bruise/bleed easily.  Psychiatric/Behavioral: Negative for depression, hallucinations and memory loss. Substance abuse:   The patient is not nervous/anxious and does not have insomnia.   All other systems reviewed and are negative.  Performance status (ECOG): 0  Physical Exam: Blood pressure (!) 148/87,  pulse 76, temperature (!) 97.4 F (36.3 C), temperature source Tympanic, weight (!) 338 lb 6.5 oz (153.5 kg), SpO2 100 %. GENERAL:  Well developed, well nourished, heavyset gentleman sitting comfortably in the exam room in no acute distress. MENTAL STATUS:  Alert and oriented to person, place and time. HEAD:  Wearing a cap. Brown hair.  Scruffy facial hair.  Normocephalic, atraumatic, face symmetric, no Cushingoid features. EYES:  Blue eyes.  Pupils equal round and reactive to light and accomodation.  No conjunctivitis or scleral icterus. ENT:  Oropharynx clear without lesion.  Tongue normal. Mucous membranes moist.  RESPIRATORY:  Clear to auscultation without rales, wheezes or rhonchi.  No rub or abnormal breath sounds left chest at site of discomfort. CARDIOVASCULAR:  Regular rate and rhythm without murmur, rub or gallop. CHEST WALL:  No palpable or visible abnormality at area of left chest wall discomfort. ABDOMEN:  Fully round.  Soft, non-tender, with active bowel sounds, and no appreciable hepatosplenomegaly.  No masses. SKIN:  No rashes, ulcers or lesions. EXTREMITIES: No edema, no skin discoloration or tenderness.  No palpable cords. LYMPH NODES: No palpable cervical, supraclavicular, axillary or inguinal adenopathy  NEUROLOGICAL: Unremarkable. PSYCH:  Appropriate.    No visits with results within 3 Day(s) from this visit.  Latest known visit with results is:  Appointment on 08/09/2018  Component Date Value Ref Range Status  . Protein S Activity 08/09/2018 68  63 - 140 % Final   Comment: (NOTE) Protein S activity may be falsely increased (masking an abnormal, low result) in patients receiving direct Xa inhibitor (e.g., rivaroxaban, apixaban, edoxaban) or a direct thrombin inhibitor (e.g., dabigatran) anticoagulant treatment due to assay interference by these drugs. Performed At: [facility] [place], [place] [id number] Rush Farmer MD [id number]   .  Protein C, Total 08/09/2018 97  60 - 150 % Final   Comment: (NOTE) Performed At: [facility] [place], [place] [id number] Rush Farmer MD [id number]   . Protein C Activity 08/09/2018 129  73 - 180 % Final   Comment: (NOTE) Performed At: [facility] [place], [place] [id number] Rush Farmer MD [id number]   . Antithrombin Activity 08/09/2018 110  75 - 135 % Final   Comment: (NOTE) Direct Xa inhibitor anticoagulants such as rivaroxaban, apixaban and edoxaban will lead to spuriously elevated antithrombin activity levels possibly masking a deficiency.   . AT III AG PPP IMM-ACNC 08/09/2018 92  72 - 124 % Final   Comment: (NOTE) This test was developed and its performance characteristics determined by [facility]. It has not been cleared or approved by the Food and Drug Administration. Performed At: [facility] [place], [place] [id number] Rush Farmer MD [id number]   . Sodium 08/09/2018 141  135 - 145 mmol/L Final  . Potassium 08/09/2018 3.5  3.5 - 5.1 mmol/L Final  . Chloride 08/09/2018 115* 98 - 111 mmol/L Final  .  CO2 08/09/2018 21* 22 - 32 mmol/L Final  . Glucose, Bld 08/09/2018 91  70 - 99 mg/dL Final  . BUN 08/09/2018 16  6 - 20 mg/dL Final  . Creatinine, Ser 08/09/2018 1.22  0.61 - 1.24 mg/dL Final  . Calcium 08/09/2018 9.0  8.9 - 10.3 mg/dL Final  . Total Protein 08/09/2018 7.3  6.5 - 8.1 g/dL Final  . Albumin 08/09/2018 4.1  3.5 - 5.0 g/dL Final  . AST 08/09/2018 16  15 - 41 U/L Final  . ALT 08/09/2018 22  0 - 44 U/L Final  . Alkaline Phosphatase 08/09/2018 51  38 - 126 U/L Final  . Total Bilirubin 08/09/2018 0.7  0.3 - 1.2 mg/dL Final  . GFR calc non Af Amer 08/09/2018 >60  >60 mL/min Final  . GFR calc Af Amer 08/09/2018 >60  >60 mL/min Final   Comment: (NOTE) The eGFR has been calculated using the CKD EPI equation. This calculation has not been validated in all clinical situations.  eGFR's persistently <60 mL/min signify possible Chronic Kidney Disease.   Georgiann Hahn gap 08/09/2018 5  5 - 15 Final   Performed at [facility], [address] [place], [place] [address]  . WBC 08/09/2018 6.7  3.8 - 10.6 K/uL Final  . RBC 08/09/2018 4.66  4.40 - 5.90 MIL/uL Final  . Hemoglobin 08/09/2018 13.8  13.0 - 18.0 g/dL Final  . HCT 08/09/2018 40.8  40.0 - 52.0 % Final  . MCV 08/09/2018 87.6  80.0 - 100.0 fL Final  . MCH 08/09/2018 29.5  26.0 - 34.0 pg Final  . MCHC 08/09/2018 33.7  32.0 - 36.0 g/dL Final  . RDW 08/09/2018 14.1  11.5 - 14.5 % Final  . Platelets 08/09/2018 243  150 - 440 K/uL Final  . Neutrophils Relative % 08/09/2018 59  % Final  . Neutro Abs 08/09/2018 3.9  1.4 - 6.5 K/uL Final  . Lymphocytes Relative 08/09/2018 34  % Final  . Lymphs Abs 08/09/2018 2.2  1.0 - 3.6 K/uL Final  . Monocytes Relative 08/09/2018 6  % Final  . Monocytes Absolute 08/09/2018 0.4  0.2 - 1.0 K/uL Final  . Eosinophils Relative 08/09/2018 1  % Final  . Eosinophils Absolute 08/09/2018 0.1  0 - 0.7 K/uL Final  . Basophils Relative 08/09/2018 0  % Final  . Basophils Absolute 08/09/2018 0.0  0 - 0.1 K/uL Final   Performed at [facility], [address], [place], [place] [address]  . Protein S Ag, Total 08/09/2018 74  60 - 150 % Final   Comment: (NOTE) This test was developed and its performance characteristics determined by [facility]. It has not been cleared or approved by the Food and Drug Administration. Performed At: [facility] [place], [place] [id number] Rush Farmer MD FA:[id number]     Assessment:  Shane Oneill is a 38 y.o. male with a history of right lower extremity superficial thrombosis x 3 and a recent history of bilateral pulmonary emboli.  He denies any precipating events.  He sits at a desk (sedentary).  He denies any family history of thrombosis.  He is on Eliquis.  Chest CT angiogram on 04/20/2018 revealed multiple acute segmental  pulmonary emboli in the lungs bilaterally.  There were small left upper lobe lingula and dependent left lower lobe pulmonary infarctions.  There was a small left pleural effusion.  Bilateral lower extremity duplex on 05/04/2018 revealed no evidence of DVT within either lower extremity.  Hypercoagulable  work-up on 04/30/2018 revealed Factor V Leiden (single [id number] mutation).  Normal studies included:  prothrombin gene mutation, lupus anticoagulant panel, anticardiolipin antibodies, beta-2 glycoprotein antibodies.  Normal studies on 08/09/2018 included protein C antigen (97%) and activity (129%), protein S antigen (74%) and activity (68%) and ATIII antigen (92%) and activity (110%).  Symptomatically, he notes intermittent left sided chest wall discomfort.  Exam is unremarkable.  Plan: 1. Labs today:  CBC with diff, CMP, D-dimer. 2. Bilateral pulmonary emboli  Continue Eliquis.   Rational for indefinite anticoagulation   Significant bilateral pulmonary emboli.    Recurrence risk after discontinuation of anticoagulation after 1st unprovoked VTE is 10% at one year and 30% at five years (5% per year after the first year).     Decrease in recurrence outweighs the rate of bleeding with full anticoagulation in most patients: low (0.8%/year) or intermediate (1.6%/year) bleeding risk.    AMPLIFT-EXT study, the risk of major bleeding for Eliquis was 0.1 - 0.2%. 3.   Chest wall pain  Etiology unclear.  Discomfort appears to be musculoskeletal.  Consider rib films:  r/o hairline fracture.  Follow-up D-dimers today.  Doubt recurrent clot on full dose anticoagulation (no pleuritic pain or shortness of breath; oxygen saturation 100%).  Patient to seek medical attention if persistent or significant pain, shortness of breath or concern for cardiac etiology. 4.   RN to call patient with D-dimers drawn today. 5.   RN to call patient in 2 weeks re: rib pain. 6.   RTC in 3 months for labs (CBC with diff, CMP). 7.    RTC in 6 months for MD assessment and labs (CBC with diff, CMP, D-dimers)  Addendum: D-dimers were 264.90 (normal).  Previously, D-dimers were 2519.38 on 04/20/2018.  Patient contacted.   Lequita Asal, MD  02/21/2019, 8:39 AM

## 2019-02-21 ENCOUNTER — Inpatient Hospital Stay: Payer: BLUE CROSS/BLUE SHIELD | Attending: Hematology and Oncology

## 2019-02-21 ENCOUNTER — Other Ambulatory Visit: Payer: Self-pay

## 2019-02-21 ENCOUNTER — Inpatient Hospital Stay (HOSPITAL_BASED_OUTPATIENT_CLINIC_OR_DEPARTMENT_OTHER): Payer: BLUE CROSS/BLUE SHIELD | Admitting: Hematology and Oncology

## 2019-02-21 ENCOUNTER — Telehealth: Payer: Self-pay

## 2019-02-21 ENCOUNTER — Encounter: Payer: Self-pay | Admitting: Hematology and Oncology

## 2019-02-21 VITALS — BP 148/87 | HR 76 | Temp 97.4°F | Wt 338.4 lb

## 2019-02-21 DIAGNOSIS — I2699 Other pulmonary embolism without acute cor pulmonale: Secondary | ICD-10-CM | POA: Diagnosis not present

## 2019-02-21 DIAGNOSIS — Z7901 Long term (current) use of anticoagulants: Secondary | ICD-10-CM

## 2019-02-21 DIAGNOSIS — Z87891 Personal history of nicotine dependence: Secondary | ICD-10-CM | POA: Diagnosis not present

## 2019-02-21 DIAGNOSIS — E876 Hypokalemia: Secondary | ICD-10-CM

## 2019-02-21 DIAGNOSIS — R0781 Pleurodynia: Secondary | ICD-10-CM

## 2019-02-21 DIAGNOSIS — D6851 Activated protein C resistance: Secondary | ICD-10-CM

## 2019-02-21 LAB — COMPREHENSIVE METABOLIC PANEL
ALT: 21 U/L (ref 0–44)
AST: 17 U/L (ref 15–41)
Albumin: 4 g/dL (ref 3.5–5.0)
Alkaline Phosphatase: 60 U/L (ref 38–126)
Anion gap: 4 — ABNORMAL LOW (ref 5–15)
BUN: 17 mg/dL (ref 6–20)
CO2: 21 mmol/L — ABNORMAL LOW (ref 22–32)
Calcium: 8.4 mg/dL — ABNORMAL LOW (ref 8.9–10.3)
Chloride: 112 mmol/L — ABNORMAL HIGH (ref 98–111)
Creatinine, Ser: 1.27 mg/dL — ABNORMAL HIGH (ref 0.61–1.24)
GFR calc Af Amer: >60 mL/min (ref >60)
GFR calc non Af Amer: >60 mL/min (ref >60)
Glucose, Bld: 136 mg/dL — ABNORMAL HIGH (ref 70–99)
Potassium: 3.4 mmol/L — ABNORMAL LOW (ref 3.5–5.1)
Sodium: 137 mmol/L (ref 135–145)
Total Bilirubin: 1.1 mg/dL (ref 0.3–1.2)
Total Protein: 7.2 g/dL (ref 6.5–8.1)

## 2019-02-21 LAB — CBC WITH DIFFERENTIAL/PLATELET
Abs Immature Granulocytes: 0.03 10*3/uL (ref 0.00–0.07)
Basophils Absolute: 0 10*3/uL (ref 0.0–0.1)
Basophils Relative: 0 %
Eosinophils Absolute: 0.1 10*3/uL (ref 0.0–0.5)
Eosinophils Relative: 1 %
HCT: 43.7 % (ref 39.0–52.0)
Hemoglobin: 14.8 g/dL (ref 13.0–17.0)
Immature Granulocytes: 0 %
Lymphocytes Relative: 26 %
Lymphs Abs: 1.7 10*3/uL (ref 0.7–4.0)
MCH: 30.3 pg (ref 26.0–34.0)
MCHC: 33.9 g/dL (ref 30.0–36.0)
MCV: 89.5 fL (ref 80.0–100.0)
Monocytes Absolute: 0.3 10*3/uL (ref 0.1–1.0)
Monocytes Relative: 4 %
Neutro Abs: 4.6 10*3/uL (ref 1.7–7.7)
Neutrophils Relative %: 69 %
Platelets: 225 10*3/uL (ref 150–400)
RBC: 4.88 MIL/uL (ref 4.22–5.81)
RDW: 13.3 % (ref 11.5–15.5)
WBC: 6.7 10*3/uL (ref 4.0–10.5)
nRBC: 0 % (ref 0.0–0.2)

## 2019-02-21 LAB — FIBRIN DERIVATIVES D-DIMER (ARMC ONLY): Fibrin derivatives D-dimer (ARMC): 264.9 ng/mL (FEU) (ref 0.00–499.00)

## 2019-02-21 NOTE — Progress Notes (Signed)
Pt here for early follow up d/t left rib soreness that has been present for the past week. Patient is concerned because he states this is the same area he had a PE in the past. Patient denies any SOB or increased pain with breathing in. States the pain comes and goes and rates it as a 2/10 at this time. States "it feels like a muscle pain but it concerned me". Denies any other problems at this time.

## 2019-02-21 NOTE — Telephone Encounter (Signed)
Left VM informing patient D-Dimer that was collected today resulted WNL. Advised patient to call the office back should he have any questions.

## 2019-02-27 ENCOUNTER — Other Ambulatory Visit: Payer: BLUE CROSS/BLUE SHIELD

## 2019-02-28 ENCOUNTER — Other Ambulatory Visit: Payer: BLUE CROSS/BLUE SHIELD

## 2019-02-28 ENCOUNTER — Telehealth: Payer: BLUE CROSS/BLUE SHIELD | Admitting: Hematology and Oncology

## 2019-03-09 DIAGNOSIS — G4733 Obstructive sleep apnea (adult) (pediatric): Secondary | ICD-10-CM | POA: Diagnosis not present

## 2019-03-22 ENCOUNTER — Other Ambulatory Visit: Payer: Self-pay | Admitting: Family Medicine

## 2019-03-22 NOTE — Telephone Encounter (Signed)
Requested Prescriptions  Pending Prescriptions Disp Refills  . sertraline (ZOLOFT) 100 MG tablet [Pharmacy Med Name: SERTRALINE 100MG  TABLETS] 90 tablet 0    Sig: TAKE 1 TABLET BY MOUTH DAILY     Psychiatry:  Antidepressants - SSRI Passed - 03/22/2019 11:47 AM      Passed - Valid encounter within last 6 months    Recent Outpatient Visits          5 months ago OSA on CPAP   [facility]   7 months ago Acute pulmonary embolism without acute cor pulmonale, unspecified pulmonary embolism type [facility])   [facility] [place], Megan P, DO

## 2019-04-08 DIAGNOSIS — G4733 Obstructive sleep apnea (adult) (pediatric): Secondary | ICD-10-CM | POA: Diagnosis not present

## 2019-04-15 ENCOUNTER — Other Ambulatory Visit: Payer: Self-pay | Admitting: *Deleted

## 2019-04-15 MED ORDER — APIXABAN 5 MG PO TABS: 5.0000 mg | ORAL_TABLET | Freq: Two times a day (BID) | ORAL | 2 refills | Status: DC

## 2019-05-08 ENCOUNTER — Encounter: Payer: Self-pay | Admitting: Family Medicine

## 2019-05-08 MED ORDER — SERTRALINE HCL 100 MG PO TABS: 200.0000 mg | ORAL_TABLET | Freq: Every day | ORAL | 0 refills | Status: DC

## 2019-05-09 DIAGNOSIS — G4733 Obstructive sleep apnea (adult) (pediatric): Secondary | ICD-10-CM | POA: Diagnosis not present

## 2019-05-23 ENCOUNTER — Other Ambulatory Visit: Payer: Self-pay

## 2019-05-23 ENCOUNTER — Inpatient Hospital Stay: Payer: BC Managed Care – PPO | Attending: Hematology and Oncology

## 2019-05-23 DIAGNOSIS — I2699 Other pulmonary embolism without acute cor pulmonale: Secondary | ICD-10-CM | POA: Diagnosis not present

## 2019-05-23 LAB — COMPREHENSIVE METABOLIC PANEL
ALT: 27 U/L (ref 0–44)
AST: 18 U/L (ref 15–41)
Albumin: 4 g/dL (ref 3.5–5.0)
Alkaline Phosphatase: 57 U/L (ref 38–126)
Anion gap: 7 (ref 5–15)
BUN: 14 mg/dL (ref 6–20)
CO2: 22 mmol/L (ref 22–32)
Calcium: 8.6 mg/dL — ABNORMAL LOW (ref 8.9–10.3)
Chloride: 110 mmol/L (ref 98–111)
Creatinine, Ser: 1.39 mg/dL — ABNORMAL HIGH (ref 0.61–1.24)
GFR calc Af Amer: >60 mL/min (ref >60)
GFR calc non Af Amer: >60 mL/min (ref >60)
Glucose, Bld: 150 mg/dL — ABNORMAL HIGH (ref 70–99)
Potassium: 3.4 mmol/L — ABNORMAL LOW (ref 3.5–5.1)
Sodium: 139 mmol/L (ref 135–145)
Total Bilirubin: 0.8 mg/dL (ref 0.3–1.2)
Total Protein: 7.2 g/dL (ref 6.5–8.1)

## 2019-05-23 LAB — CBC WITH DIFFERENTIAL/PLATELET
Abs Immature Granulocytes: 0.05 10*3/uL (ref 0.00–0.07)
Basophils Absolute: 0 10*3/uL (ref 0.0–0.1)
Basophils Relative: 0 %
Eosinophils Absolute: 0.1 10*3/uL (ref 0.0–0.5)
Eosinophils Relative: 1 %
HCT: 42.2 % (ref 39.0–52.0)
Hemoglobin: 14.3 g/dL (ref 13.0–17.0)
Immature Granulocytes: 1 %
Lymphocytes Relative: 29 %
Lymphs Abs: 2 10*3/uL (ref 0.7–4.0)
MCH: 29.7 pg (ref 26.0–34.0)
MCHC: 33.9 g/dL (ref 30.0–36.0)
MCV: 87.7 fL (ref 80.0–100.0)
Monocytes Absolute: 0.3 10*3/uL (ref 0.1–1.0)
Monocytes Relative: 5 %
Neutro Abs: 4.3 10*3/uL (ref 1.7–7.7)
Neutrophils Relative %: 64 %
Platelets: 239 10*3/uL (ref 150–400)
RBC: 4.81 MIL/uL (ref 4.22–5.81)
RDW: 13.1 % (ref 11.5–15.5)
WBC: 6.8 10*3/uL (ref 4.0–10.5)
nRBC: 0 % (ref 0.0–0.2)

## 2019-06-08 DIAGNOSIS — G4733 Obstructive sleep apnea (adult) (pediatric): Secondary | ICD-10-CM | POA: Diagnosis not present

## 2019-06-14 ENCOUNTER — Telehealth: Payer: Self-pay

## 2019-06-14 NOTE — Telephone Encounter (Signed)
[id number];ZESPQZ:[id number];Review Type:Prior Auth;Coverage Start Date:05/15/2019;Coverage End Date:06/13/2020; For Eliquis 5 mg 1 tab po daily. The patient was made aware today.

## 2019-07-09 DIAGNOSIS — G4733 Obstructive sleep apnea (adult) (pediatric): Secondary | ICD-10-CM | POA: Diagnosis not present

## 2019-08-08 ENCOUNTER — Telehealth: Payer: Self-pay | Admitting: Family Medicine

## 2019-08-09 ENCOUNTER — Other Ambulatory Visit: Payer: Self-pay

## 2019-08-09 DIAGNOSIS — R6889 Other general symptoms and signs: Secondary | ICD-10-CM | POA: Diagnosis not present

## 2019-08-09 DIAGNOSIS — G4733 Obstructive sleep apnea (adult) (pediatric): Secondary | ICD-10-CM | POA: Diagnosis not present

## 2019-08-09 DIAGNOSIS — Z20822 Contact with and (suspected) exposure to covid-19: Secondary | ICD-10-CM

## 2019-08-11 LAB — NOVEL CORONAVIRUS, NAA: SARS-CoV-2, NAA: NOT DETECTED

## 2019-08-14 ENCOUNTER — Other Ambulatory Visit: Payer: Self-pay

## 2019-08-14 DIAGNOSIS — D6851 Activated protein C resistance: Secondary | ICD-10-CM

## 2019-08-14 DIAGNOSIS — I2699 Other pulmonary embolism without acute cor pulmonale: Secondary | ICD-10-CM

## 2019-08-20 NOTE — Telephone Encounter (Signed)
Patient states that in order for insurance to cover this medication, it will need to be resent as 90 day supply. Please advise.

## 2019-08-22 ENCOUNTER — Inpatient Hospital Stay: Payer: BC Managed Care – PPO | Attending: Hematology and Oncology | Admitting: Hematology and Oncology

## 2019-08-22 ENCOUNTER — Inpatient Hospital Stay: Payer: BC Managed Care – PPO

## 2019-09-03 ENCOUNTER — Other Ambulatory Visit: Payer: Self-pay | Admitting: Family Medicine

## 2019-09-04 ENCOUNTER — Telehealth: Payer: Self-pay

## 2019-09-04 NOTE — Addendum Note (Signed)
Addended by: Valerie Roys on: 09/04/2019 12:49 PM   Modules accepted: Orders

## 2019-09-04 NOTE — Telephone Encounter (Signed)
I forgot to let you know Dr. Wynetta Emery that patient's wife Claiborne Billings requested a 16 day supply as the insurance won't pay for 30 or 60 day supply.

## 2019-09-04 NOTE — Telephone Encounter (Signed)
Patient's wife Claiborne Billings notified. Stated she will have patient call the office as soon as he can to schedule OV

## 2019-09-04 NOTE — Telephone Encounter (Signed)
Pts wife called and is upset as she was not advised that pt needed an appt in order to get his medication refilled. Pts wife was offered next available appt with PCP and she refused. Pts wife is requesting a call back from front office staff. Please advise.

## 2019-09-04 NOTE — Telephone Encounter (Signed)
I will not send in 90 day supply until he is seen. I'm sorry.

## 2019-09-04 NOTE — Telephone Encounter (Addendum)
I'm not in the office this afternoon, so I cannot call today. Once his appointment is scheduled, I will get him enough medicine to make it to his appointment. Rx already pended in the chart in case I'm unavailable when the appointment is scheduled.

## 2019-09-04 NOTE — Telephone Encounter (Signed)
Patient has not been seen since November 2019. Was instructed to return for physical in the spring- refills given, and on last Rx on 9/10 note was sent to pharmacy that he needed an appointment for more refills.   I'm very happy to see him whenever they can get him in, but I will need to see him to give him any more refills.

## 2019-09-04 NOTE — Telephone Encounter (Signed)
Verbal from [place] to call in Sertraline 100mg  take two tablets daily 60 with 0 refills. Medication called in.

## 2019-09-04 NOTE — Telephone Encounter (Signed)
Called pt to schedule appt, no answer, left vm to call back. Please advise.

## 2019-09-04 NOTE — Telephone Encounter (Signed)
Hey Dr. Wynetta Emery, I'm sorry to reach out to you through the portal for this but, I don't know any other way to do this.  Can you please have someone that actually works there at the office call me about a RX refill?  I am done talking to the call center folks and I don't trust that they will have someone there call me back today about getting a refill for [facility].  Also, hope you are doing well!  Thank you in advance! Shane Oneill     This was received via my chart. Please review. Shane Oneill, patient's wife and spoke to her. She stated that patient needs a refill on Sertraline. I read Dr. Durenda Age message to [facility] and she verbalized understanding. Shane Oneill stated that she will have her husband call this afternoon to make an appt, but he cannot wait for his refill as he only has one more pill for tonight. She stated that patient will make an appt but he need the refill now/today. She stated that is not their fault that the pharmacy did not tell them that patient will need an appointment for the next refill. Also, Shane Oneill stated that patient was scheduled for a physical but was cancelled because of covid 19 and nobody call him back to reschedule. Shane Oneill stated that she wants Dr. Wynetta Emery to call her today because she wants to speak to her.

## 2019-09-05 MED ORDER — SERTRALINE HCL 100 MG PO TABS: 200.0000 mg | ORAL_TABLET | Freq: Every day | ORAL | 0 refills | Status: DC

## 2019-09-06 ENCOUNTER — Encounter: Payer: Self-pay | Admitting: Family Medicine

## 2019-09-06 ENCOUNTER — Other Ambulatory Visit: Payer: Self-pay

## 2019-09-06 ENCOUNTER — Ambulatory Visit (INDEPENDENT_AMBULATORY_CARE_PROVIDER_SITE_OTHER): Payer: BC Managed Care – PPO | Admitting: Family Medicine

## 2019-09-06 DIAGNOSIS — F419 Anxiety disorder, unspecified: Secondary | ICD-10-CM | POA: Diagnosis not present

## 2019-09-06 DIAGNOSIS — K219 Gastro-esophageal reflux disease without esophagitis: Secondary | ICD-10-CM | POA: Diagnosis not present

## 2019-09-06 DIAGNOSIS — D6851 Activated protein C resistance: Secondary | ICD-10-CM | POA: Diagnosis not present

## 2019-09-06 MED ORDER — SERTRALINE HCL 100 MG PO TABS: 200.0000 mg | ORAL_TABLET | Freq: Every day | ORAL | 1 refills | Status: DC

## 2019-09-06 MED ORDER — OMEPRAZOLE 20 MG PO CPDR: 20.0000 mg | DELAYED_RELEASE_CAPSULE | Freq: Every day | ORAL | 3 refills | Status: DC

## 2019-09-06 NOTE — Assessment & Plan Note (Signed)
Under good control on current regimen. Continue current regimen. Continue to monitor. Call with any concerns. Refills given.   

## 2019-09-06 NOTE — Progress Notes (Signed)
Ht 5\' 11"  (1.803 m)   Wt (!) 330 lb (149.7 kg)   BMI 46.03 kg/m    Subjective:    Patient ID: Shane Oneill, male    DOB: September 30, 1981, 38 y.o.   MRN: [id number]  HPI: Shane Oneill is a 38 y.o. male  Chief Complaint  Patient presents with  . Anxiety    sertraline refill   ANXIETY/STRESS Duration:controlled Anxious mood: no  Excessive worrying: no Irritability: no  Sweating: no Nausea: no Palpitations:no Hyperventilation: no Panic attacks: no Agoraphobia: no  Obscessions/compulsions: no Depressed mood: no Depression screen [facility] 2/9 09/06/2019 10/03/2018 08/15/2018  Decreased Interest 0 0 0  Down, Depressed, Hopeless 0 0 0  PHQ - 2 Score 0 0 0  Altered sleeping 0 0 0  Tired, decreased energy 0 0 0  Change in appetite 0 0 0  Feeling bad or failure about yourself  0 0 0  Trouble concentrating 0 0 0  Moving slowly or fidgety/restless 0 0 0  Suicidal thoughts 0 0 0  PHQ-9 Score 0 0 0  Difficult doing work/chores - Not difficult at all Not difficult at all   GAD 7 : Generalized Anxiety Score 09/06/2019 10/03/2018 08/15/2018  Nervous, Anxious, on Edge 0 1 1  Control/stop worrying 0 0 0  Worry too much - different things 1 0 0  Trouble relaxing 0 0 0  Restless 0 0 0  Easily annoyed or irritable 1 1 1   Afraid - awful might happen 0 0 0  Total GAD 7 Score 2 2 2   Anxiety Difficulty Not difficult at all Not difficult at all Not difficult at all   Anhedonia: no Weight changes: no Insomnia: no   Hypersomnia: no Fatigue/loss of energy: no Feelings of worthlessness: no Feelings of guilt: no Impaired concentration/indecisiveness: no Suicidal ideations: no  Crying spells: no Recent Stressors/Life Changes: no   Relationship problems: no   Family stress: no     Financial stress: no    Job stress: no    Recent death/loss: no  Still taking his eliquis. Doing well. No bleeding.   Relevant past medical, surgical, family and social history reviewed and updated as indicated.  Interim medical history since our last visit reviewed. Allergies and medications reviewed and updated.  Review of Systems  Constitutional: Negative.   Respiratory: Negative.   Cardiovascular: Negative.   Musculoskeletal: Negative.   Neurological: Negative.   Psychiatric/Behavioral: Negative.     Per HPI unless specifically indicated above     Objective:    Ht 5\' 11"  (1.803 m)   Wt (!) 330 lb (149.7 kg)   BMI 46.03 kg/m   Wt Readings from Last 3 Encounters:  09/06/19 (!) 330 lb (149.7 kg)  02/21/19 (!) 338 lb 6.5 oz (153.5 kg)  10/03/18 (!) 332 lb 6 oz (150.8 kg)    Physical Exam Vitals signs and nursing note reviewed.  Constitutional:      General: He is not in acute distress.    Appearance: Normal appearance. He is not ill-appearing, toxic-appearing or diaphoretic.  HENT:     Head: Normocephalic and atraumatic.     Right Ear: External ear normal.     Left Ear: External ear normal.     Nose: Nose normal.     Mouth/Throat:     Mouth: Mucous membranes are moist.     Pharynx: Oropharynx is clear.  Eyes:     General: No scleral icterus.       Right eye: No discharge.  Left eye: No discharge.     Conjunctiva/sclera: Conjunctivae normal.     Pupils: Pupils are equal, round, and reactive to light.  Neck:     Musculoskeletal: Normal range of motion.  Pulmonary:     Effort: Pulmonary effort is normal. No respiratory distress.     Comments: Speaking in full sentences Musculoskeletal: Normal range of motion.  Skin:    Coloration: Skin is not jaundiced or pale.     Findings: No bruising, erythema, lesion or rash.  Neurological:     Mental Status: He is alert and oriented to person, place, and time. Mental status is at baseline.  Psychiatric:        Mood and Affect: Mood normal.        Behavior: Behavior normal.        Thought Content: Thought content normal.        Judgment: Judgment normal.     Results for orders placed or performed in visit on 08/09/19   Novel Coronavirus, NAA ([facility])   Specimen: Oropharyngeal(OP) collection in vial transport medium   OROPHARYNGEA  TESTING  Result Value Ref Range   SARS-CoV-2, NAA Not Detected Not Detected      Assessment & Plan:   Problem List Items Addressed This Visit      Digestive   Gastroesophageal reflux disease    Under good control on current regimen. Continue current regimen. Continue to monitor. Call with any concerns. Refills given.        Relevant Medications   omeprazole (PRILOSEC) 20 MG capsule     Hematopoietic and Hemostatic   Factor V Leiden mutation ([place])    Continues to follow with hematology. Continues on eliquis. Call with any concerns.         Other   Anxiety    Under good control on current regimen. Continue current regimen. Continue to monitor. Call with any concerns. Refills given.        Relevant Medications   sertraline (ZOLOFT) 100 MG tablet       Follow up plan: Return in about 6 months (around 03/06/2020) for Physical.   . This visit was completed via FaceTime due to the restrictions of the COVID-19 pandemic. All issues as above were discussed and addressed. Physical exam was done as above through visual confirmation on FaceTime. If it was felt that the patient should be evaluated in the office, they were directed there. The patient verbally consented to this visit. . Location of the patient: home . Location of the provider: work . Those involved with this call:  . Provider: Park Liter, DO . CMA: Lesle Chris, [place] . Front Desk/Registration: Don Perking  . Time spent on call: 15 minutes with patient face to face via video conference. More than 50% of this time was spent in counseling and coordination of care. 23 minutes total spent in review of patient's record and preparation of their chart.

## 2019-09-06 NOTE — Assessment & Plan Note (Signed)
Continues to follow with hematology. Continues on eliquis. Call with any concerns.

## 2019-10-04 ENCOUNTER — Telehealth: Payer: Self-pay

## 2019-10-04 ENCOUNTER — Encounter: Payer: Self-pay | Admitting: Hematology and Oncology

## 2019-10-04 NOTE — Telephone Encounter (Signed)
Left a voice mail to inform the patient to contact the office to see if i can help with his request.

## 2019-10-15 ENCOUNTER — Other Ambulatory Visit: Payer: Self-pay | Admitting: Hematology and Oncology

## 2019-10-15 ENCOUNTER — Telehealth: Payer: Self-pay

## 2019-10-15 NOTE — Telephone Encounter (Signed)
Spoe with the patient due to refill request. The patient states he don't need a refill at this time. he states he still have 1 month or so of pills left to take. I have also informed the patient that he need a follow up appointment before Dr Mike Gip can refill the medication. The patient has agreed when he is 1 week out he will call and schedule appointment.

## 2019-10-15 NOTE — Telephone Encounter (Signed)
-----   Message from Lequita Asal, MD sent at 10/15/2019  1:23 PM EST ----- Regarding: Please call patient  Request received for Eliquis.  We have not seen him in > 6 months.  Last appt was missed on 08/22/2019.  How many pills does he have left?  M

## 2019-12-08 ENCOUNTER — Other Ambulatory Visit: Payer: Self-pay | Admitting: Hematology and Oncology

## 2019-12-09 ENCOUNTER — Encounter: Payer: Self-pay | Admitting: Hematology and Oncology

## 2019-12-09 ENCOUNTER — Other Ambulatory Visit: Payer: Self-pay

## 2019-12-09 MED ORDER — APIXABAN 5 MG PO TABS: 5.0000 mg | ORAL_TABLET | Freq: Two times a day (BID) | ORAL | 0 refills | Status: DC

## 2019-12-12 ENCOUNTER — Inpatient Hospital Stay: Payer: BC Managed Care – PPO | Attending: Hematology and Oncology

## 2019-12-12 ENCOUNTER — Other Ambulatory Visit: Payer: Self-pay

## 2019-12-12 DIAGNOSIS — I2699 Other pulmonary embolism without acute cor pulmonale: Secondary | ICD-10-CM

## 2019-12-12 LAB — CBC WITH DIFFERENTIAL/PLATELET
Abs Immature Granulocytes: 0.04 10*3/uL (ref 0.00–0.07)
Basophils Absolute: 0 10*3/uL (ref 0.0–0.1)
Basophils Relative: 0 %
Eosinophils Absolute: 0.1 10*3/uL (ref 0.0–0.5)
Eosinophils Relative: 1 %
HCT: 43 % (ref 39.0–52.0)
Hemoglobin: 14.5 g/dL (ref 13.0–17.0)
Immature Granulocytes: 1 %
Lymphocytes Relative: 26 %
Lymphs Abs: 1.9 10*3/uL (ref 0.7–4.0)
MCH: 29.6 pg (ref 26.0–34.0)
MCHC: 33.7 g/dL (ref 30.0–36.0)
MCV: 87.8 fL (ref 80.0–100.0)
Monocytes Absolute: 0.5 10*3/uL (ref 0.1–1.0)
Monocytes Relative: 6 %
Neutro Abs: 5 10*3/uL (ref 1.7–7.7)
Neutrophils Relative %: 66 %
Platelets: 235 10*3/uL (ref 150–400)
RBC: 4.9 MIL/uL (ref 4.22–5.81)
RDW: 13.1 % (ref 11.5–15.5)
WBC: 7.6 10*3/uL (ref 4.0–10.5)
nRBC: 0 % (ref 0.0–0.2)

## 2019-12-12 LAB — COMPREHENSIVE METABOLIC PANEL
ALT: 30 U/L (ref 0–44)
AST: 18 U/L (ref 15–41)
Albumin: 4.2 g/dL (ref 3.5–5.0)
Alkaline Phosphatase: 54 U/L (ref 38–126)
Anion gap: 6 (ref 5–15)
BUN: 20 mg/dL (ref 6–20)
CO2: 20 mmol/L — ABNORMAL LOW (ref 22–32)
Calcium: 8.7 mg/dL — ABNORMAL LOW (ref 8.9–10.3)
Chloride: 110 mmol/L (ref 98–111)
Creatinine, Ser: 1.2 mg/dL (ref 0.61–1.24)
GFR calc Af Amer: >60 mL/min (ref >60)
GFR calc non Af Amer: >60 mL/min (ref >60)
Glucose, Bld: 101 mg/dL — ABNORMAL HIGH (ref 70–99)
Potassium: 3.6 mmol/L (ref 3.5–5.1)
Sodium: 136 mmol/L (ref 135–145)
Total Bilirubin: 0.5 mg/dL (ref 0.3–1.2)
Total Protein: 7.7 g/dL (ref 6.5–8.1)

## 2019-12-17 NOTE — Progress Notes (Signed)
[facility]  [address], [address] [place], [place] [address] Phone: [phone]  Fax: [phone]   Telemedicine Office Visit:  12/18/2019  Referring physician: Dorcas Carrow, DO  I connected with Shane Oneill on 12/18/19 at 9:36 AM by videoconferencing and verified that I was speaking with the correct person using 2 identifiers.  The patient was at home.  I discussed the limitations, risk, security and privacy concerns of performing an evaluation and management service by videoconferencing and the availability of in person appointments.  I also discussed with the patient that there may be a patient responsible charge related to this service.  The patient expressed understanding and agreed to proceed.   Chief Complaint: Shane Oneill is a 39 y.o. male with pulmonary embolism and heterozygosity for Factor V Leiden who is seen for 10 month reassessment.    HPI: The patient was last seen in the hematology clinic on 02/21/2019. At that time, he noted intermittent left sided chest wall discomfort.  Exam was unremarkable. D-dimers were 264.90 (normal). Previously, D-dimers were 2519.38 on 04/20/2018.  He was seen for follow up of his idiopathic intracranial hypertension (IIH) with Dr. Rhunette Croft on 08/22/2019. He was to continue his Diamox 1500 mg BID. He has a follow up in 1 year.   CBC followed  05/23/2019: Hematocrit 42.2, hemoglobin 14.3, MCV 87.7, platelets 239,000, WBC 6800, ANC 4300.  12/12/2019: Hematocrit 43.0, hemoglobin 14.5, MCV 87.8, platelets 235,000, WBC 7600, ANC 5000.   During the interim, he has felt the same.  He feels no significant changes have happened.   He denies any chest pain, hemoptysis, or lower extremity edema.  He hasn't had any chest wall pain since his last visit.  He notes that he sits for most of the day in front of a computer.   Past Medical History:  Diagnosis Date  . Anxiety   . Blood clot in vein   . Idiopathic intracranial  hypertension   . Sleep apnea     Past Surgical History:  Procedure Laterality Date  . club feet      Family History  Problem Relation Age of Onset  . Hypertension Mother   . Healthy Father   . Cancer Maternal Aunt     Social History:  reports that he quit smoking about 4 years ago. His smoking use included cigarettes. He has a 5.00 pack-year smoking history. He has never used smokeless tobacco. He reports previous alcohol use. He reports that he does not use drugs. He previously smoked 1/2 pack/day x 10 years.  He rarely uses alcohol. Patient denies known exposures to radiation on toxins.  He works in an office. The patient is alone  today.  Participants in the patient's visit and their role in the encounter included the patient, Arnette Norris, scribe, and  [place] [employer], CMA, today.  The intake visit was provided by Arnette Norris, scribe, and   Bronwen Betters, CMA.   Allergies:  Allergies  Allergen Reactions  . Cefoxitin Hives    Current Medications: Current Outpatient Medications  Medication Sig Dispense Refill  . acetaZOLAMIDE (DIAMOX) 250 MG tablet Take 1,500 mg by mouth 2 (two) times daily.    Marland Kitchen ALPRAZolam (XANAX) 0.5 MG tablet alprazolam 0.5 mg tablet daily PRN 30 tablet 0  . apixaban (ELIQUIS) 5 MG TABS tablet Take 1 tablet (5 mg total) by mouth 2 (two) times daily. 60 tablet 0  . omeprazole (PRILOSEC) 20 MG capsule Take 1 capsule (20 mg total) by  mouth daily. 90 capsule 3  . sertraline (ZOLOFT) 100 MG tablet Take 2 tablets (200 mg total) by mouth daily. 180 tablet 1   No current facility-administered medications for this visit.    Review of Systems  Constitutional: Negative.  Negative for chills, diaphoresis, fever and malaise/fatigue.       Feels "ok".  HENT: Negative.  Negative for congestion, ear discharge, ear pain, nosebleeds, sinus pain, sore throat and tinnitus.   Eyes: Negative.  Negative for blurred vision, double vision, photophobia, pain, discharge  and redness.  Respiratory: Negative.  Negative for cough, hemoptysis, sputum production, shortness of breath and wheezing.        Sleep apnea on CPAP.  Cardiovascular: Negative.  Negative for chest pain, palpitations, orthopnea, leg swelling and PND.  Gastrointestinal: Negative.  Negative for abdominal pain, blood in stool, constipation, diarrhea, heartburn, nausea and vomiting.  Genitourinary: Negative.  Negative for dysuria, frequency, hematuria and urgency.  Musculoskeletal: Negative.  Negative for back pain, falls, joint pain, myalgias and neck pain.       Left sided chest wall pain, resolved.  Skin: Negative.  Negative for itching and rash.  Neurological: Negative.  Negative for dizziness, tingling, tremors, sensory change, speech change, focal weakness, weakness and headaches.  Endo/Heme/Allergies: Negative.  Does not bruise/bleed easily.  Psychiatric/Behavioral: Negative.  Negative for depression, hallucinations, memory loss and substance abuse. The patient is not nervous/anxious and does not have insomnia.   All other systems reviewed and are negative.  Performance status (ECOG): 0  Vitals There were no vitals taken for this visit.   Physical Exam  Constitutional: He is oriented to person, place, and time. He appears well-developed and well-nourished. No distress.  HENT:  Head: Atraumatic.  Wearing a cap.  Brown hair and beard.  Eyes: Conjunctivae and EOM are normal. No scleral icterus.  Blue eyes.  Neurological: He is alert and oriented to person, place, and time.  Skin: He is not diaphoretic.  Psychiatric: He has a normal mood and affect. His behavior is normal. Judgment and thought content normal.  Nursing note reviewed.   No visits with results within 3 Day(s) from this visit.  Latest known visit with results is:  Appointment on 12/12/2019  Component Date Value Ref Range Status  . Sodium 12/12/2019 136  135 - 145 mmol/L Final  . Potassium 12/12/2019 3.6  3.5 - 5.1  mmol/L Final  . Chloride 12/12/2019 110  98 - 111 mmol/L Final  . CO2 12/12/2019 20* 22 - 32 mmol/L Final  . Glucose, Bld 12/12/2019 101* 70 - 99 mg/dL Final  . BUN 78/29/5621 20  6 - 20 mg/dL Final  . Creatinine, Ser 12/12/2019 1.20  0.61 - 1.24 mg/dL Final  . Calcium 30/86/5784 8.7* 8.9 - 10.3 mg/dL Final  . Total Protein 12/12/2019 7.7  6.5 - 8.1 g/dL Final  . Albumin 69/62/9528 4.2  3.5 - 5.0 g/dL Final  . AST 41/32/4401 18  15 - 41 U/L Final  . ALT 12/12/2019 30  0 - 44 U/L Final  . Alkaline Phosphatase 12/12/2019 54  38 - 126 U/L Final  . Total Bilirubin 12/12/2019 0.5  0.3 - 1.2 mg/dL Final  . GFR calc non Af Amer 12/12/2019 >60  >60 mL/min Final  . GFR calc Af Amer 12/12/2019 >60  >60 mL/min Final  . Anion gap 12/12/2019 6  5 - 15 Final   Performed at [facility], [address], [place], [place] [address]  . WBC 12/12/2019  7.6  4.0 - 10.5 K/uL Final  . RBC 12/12/2019 4.90  4.22 - 5.81 MIL/uL Final  . Hemoglobin 12/12/2019 14.5  13.0 - 17.0 g/dL Final  . HCT 12/12/2019 43.0  39.0 - 52.0 % Final  . MCV 12/12/2019 87.8  80.0 - 100.0 fL Final  . MCH 12/12/2019 29.6  26.0 - 34.0 pg Final  . MCHC 12/12/2019 33.7  30.0 - 36.0 g/dL Final  . RDW 12/12/2019 13.1  11.5 - 15.5 % Final  . Platelets 12/12/2019 235  150 - 400 K/uL Final  . nRBC 12/12/2019 0.0  0.0 - 0.2 % Final  . Neutrophils Relative % 12/12/2019 66  % Final  . Neutro Abs 12/12/2019 5.0  1.7 - 7.7 K/uL Final  . Lymphocytes Relative 12/12/2019 26  % Final  . Lymphs Abs 12/12/2019 1.9  0.7 - 4.0 K/uL Final  . Monocytes Relative 12/12/2019 6  % Final  . Monocytes Absolute 12/12/2019 0.5  0.1 - 1.0 K/uL Final  . Eosinophils Relative 12/12/2019 1  % Final  . Eosinophils Absolute 12/12/2019 0.1  0.0 - 0.5 K/uL Final  . Basophils Relative 12/12/2019 0  % Final  . Basophils Absolute 12/12/2019 0.0  0.0 - 0.1 K/uL Final  . Immature Granulocytes 12/12/2019 1  % Final  . Abs Immature Granulocytes 12/12/2019 0.04   0.00 - 0.07 K/uL Final   Performed at [facility], [address] [place], [place] [address]    Assessment:  Shane Oneill is a 39 y.o. male with a history of right lower extremity superficial thrombosis x 3 and a history of bilateral pulmonary emboli.  He denies any precipating events.  He sits at a desk (sedentary).  He denies any family history of thrombosis.  He is on Eliquis.  Chest CT angiogram on 04/20/2018 revealed multiple acute segmental pulmonary emboli in the lungs bilaterally.  There were small left upper lobe lingula and dependent left lower lobe pulmonary infarctions.  There was a small left pleural effusion.  Bilateral lower extremity duplex on 05/04/2018 revealed no evidence of DVT within either lower extremity.  Hypercoagulable work-up on 04/30/2018 revealed Factor V Leiden (single [id number] mutation).  Normal studies included:  prothrombin gene mutation, lupus anticoagulant panel, anticardiolipin antibodies, beta-2 glycoprotein antibodies.  Normal studies on 08/09/2018 included protein C antigen (97%) and activity (129%), protein S antigen (74%) and activity (68%) and ATIII antigen (92%) and activity (110%).  Symptomatically, he is doing well.  He denies any shortness of breath or lower extremity edema.  He denies any bruising or bleeding.  Plan: 1.   Review labs from 12/12/2019. 2.   Bilateral pulmonary emboli             Review interim events.             Rational for indefinite anticoagulation                         Significant bilateral pulmonary emboli.                          Recurrence risk after discontinuation of anticoagulation after 1st unprovoked VTE:    10% at one year and 30% at five years (5% per year after the first year).                           Decrease in recurrence outweighs the rate  of bleeding with full anticoagulation                            AMPLIFY-EXT study    Approximtely 2500 patients who had completed 6-12 months of Eliquis or  Coumadin.    Patients randomized to Eliquis (therapeutic or prophylactic dosing) or placebo.    Risk of VTE or VTE-related death (1.7% vs 8.8%)     Risk of major bleeding (0.1-0.2% vs 0.5%); non-major bleeding (3.0-4.2% vs 2.3%).  Discuss continuation of Eliquis. 3.   Chest wall pain, resolved             Continue to monitor. 4.   RTC in 3 months for labs (CBC with diff, CMP). 5.   RTC in 6 months for MD assessment and labs (CBC with diff, CMP).  I discussed the assessment and treatment plan with the patient.  The patient was provided an opportunity to ask questions and all were answered.  The patient agreed with the plan and demonstrated an understanding of the instructions.  The patient was advised to call back if the symptoms worsen or if the condition fails to improve as anticipated.   Rosey Bath, MD, PhD    12/18/2019, 9:47 AM  I, Arnette Norris, am acting as a scribe for Rosey Bath, MD.  I, Thai Burgueno C. Merlene Pulling, MD, have reviewed the above documentation for accuracy and completeness, and I agree with the above.

## 2019-12-18 ENCOUNTER — Inpatient Hospital Stay (HOSPITAL_BASED_OUTPATIENT_CLINIC_OR_DEPARTMENT_OTHER): Payer: BC Managed Care – PPO | Admitting: Hematology and Oncology

## 2019-12-18 ENCOUNTER — Encounter: Payer: Self-pay | Admitting: Hematology and Oncology

## 2019-12-18 DIAGNOSIS — I2699 Other pulmonary embolism without acute cor pulmonale: Secondary | ICD-10-CM | POA: Diagnosis not present

## 2019-12-18 DIAGNOSIS — D6851 Activated protein C resistance: Secondary | ICD-10-CM | POA: Diagnosis not present

## 2019-12-18 NOTE — Progress Notes (Signed)
No new changes noted today. The patient Name and DOB has been verified by phone today. 

## 2020-01-06 ENCOUNTER — Other Ambulatory Visit: Payer: Self-pay | Admitting: Hematology and Oncology

## 2020-03-12 ENCOUNTER — Encounter: Payer: Self-pay | Admitting: Family Medicine

## 2020-03-12 ENCOUNTER — Other Ambulatory Visit: Payer: Self-pay

## 2020-03-12 ENCOUNTER — Ambulatory Visit (INDEPENDENT_AMBULATORY_CARE_PROVIDER_SITE_OTHER): Payer: BC Managed Care – PPO | Admitting: Family Medicine

## 2020-03-12 VITALS — BP 134/83 | HR 78 | Temp 98.1°F | Ht 72.24 in | Wt 352.2 lb

## 2020-03-12 DIAGNOSIS — I2699 Other pulmonary embolism without acute cor pulmonale: Secondary | ICD-10-CM | POA: Diagnosis not present

## 2020-03-12 DIAGNOSIS — Z0000 Encounter for general adult medical examination without abnormal findings: Secondary | ICD-10-CM | POA: Diagnosis not present

## 2020-03-12 DIAGNOSIS — F419 Anxiety disorder, unspecified: Secondary | ICD-10-CM

## 2020-03-12 DIAGNOSIS — D6851 Activated protein C resistance: Secondary | ICD-10-CM

## 2020-03-12 DIAGNOSIS — Z136 Encounter for screening for cardiovascular disorders: Secondary | ICD-10-CM | POA: Diagnosis not present

## 2020-03-12 LAB — UA/M W/RFLX CULTURE, ROUTINE
Bilirubin, UA: NEGATIVE
Glucose, UA: NEGATIVE
Ketones, UA: NEGATIVE
Leukocytes,UA: NEGATIVE
Nitrite, UA: NEGATIVE
Protein,UA: NEGATIVE
RBC, UA: NEGATIVE
Specific Gravity, UA: 1.025 (ref 1.005–1.030)
Urobilinogen, Ur: 0.2 mg/dL (ref 0.2–1.0)
pH, UA: 5 (ref 5.0–7.5)

## 2020-03-12 MED ORDER — ALPRAZOLAM 0.5 MG PO TABS: ORAL_TABLET | ORAL | 0 refills | Status: DC

## 2020-03-12 MED ORDER — SERTRALINE HCL 100 MG PO TABS: 200.0000 mg | ORAL_TABLET | Freq: Every day | ORAL | 1 refills | Status: DC

## 2020-03-12 NOTE — Assessment & Plan Note (Signed)
Continue to follow with hematology. Call with any concerns. Continue to monitor.  ?

## 2020-03-12 NOTE — Progress Notes (Signed)
BP 134/83 (BP Location: Left Arm, Patient Position: Sitting, Cuff Size: Large)   Pulse 78   Temp 98.1 F (36.7 C) (Oral)   Ht 6' 0.24" (1.835 m)   Wt (!) 352 lb 3.2 oz (159.8 kg)   SpO2 96%   BMI 47.44 kg/m    Subjective:    Patient ID: Shane Oneill, male    DOB: 1981/10/22, 39 y.o.   MRN: [id number]  HPI: Shane Oneill is a 39 y.o. male presenting on 03/12/2020 for comprehensive medical examination. Current medical complaints include:  ANXIETY/STRESS Duration:stable Anxious mood: no  Excessive worrying: no Irritability: no  Sweating: no Nausea: no Palpitations:no Hyperventilation: no Panic attacks: no Agoraphobia: no  Obscessions/compulsions: no Depressed mood: no Depression screen [facility] 2/9 03/12/2020 09/06/2019 10/03/2018 08/15/2018  Decreased Interest 0 0 0 0  Down, Depressed, Hopeless 0 0 0 0  PHQ - 2 Score 0 0 0 0  Altered sleeping 1 0 0 0  Tired, decreased energy 0 0 0 0  Change in appetite 0 0 0 0  Feeling bad or failure about yourself  0 0 0 0  Trouble concentrating 0 0 0 0  Moving slowly or fidgety/restless 0 0 0 0  Suicidal thoughts 0 0 0 0  PHQ-9 Score 1 0 0 0  Difficult doing work/chores - - Not difficult at all Not difficult at all   Anhedonia: no Weight changes: no Insomnia: no   Hypersomnia: no Fatigue/loss of energy: no Feelings of worthlessness: no Feelings of guilt: no Impaired concentration/indecisiveness: no Suicidal ideations: no  Crying spells: no Recent Stressors/Life Changes: no   Relationship problems: no   Family stress: no     Financial stress: no    Job stress: no    Recent death/loss: no  He currently lives with: significant other and son Interim Problems from his last visit: no  Depression Screen done today and results listed below:  Depression screen [facility] 2/9 03/12/2020 09/06/2019 10/03/2018 08/15/2018  Decreased Interest 0 0 0 0  Down, Depressed, Hopeless 0 0 0 0  PHQ - 2 Score 0 0 0 0  Altered sleeping 1 0 0 0  Tired,  decreased energy 0 0 0 0  Change in appetite 0 0 0 0  Feeling bad or failure about yourself  0 0 0 0  Trouble concentrating 0 0 0 0  Moving slowly or fidgety/restless 0 0 0 0  Suicidal thoughts 0 0 0 0  PHQ-9 Score 1 0 0 0  Difficult doing work/chores - - Not difficult at all Not difficult at all   Past Medical History:  Past Medical History:  Diagnosis Date  . Anxiety   . Blood clot in vein   . Idiopathic intracranial hypertension   . Sleep apnea     Surgical History:  Past Surgical History:  Procedure Laterality Date  . club feet      Medications:  Current Outpatient Medications on File Prior to Visit  Medication Sig  . acetaZOLAMIDE (DIAMOX) 250 MG tablet Take 1,500 mg by mouth 2 (two) times daily.  Marland Kitchen ELIQUIS 5 MG TABS tablet TAKE 1 TABLET(5 MG) BY MOUTH TWICE DAILY  . omeprazole (PRILOSEC) 20 MG capsule Take 1 capsule (20 mg total) by mouth daily.   No current facility-administered medications on file prior to visit.    Allergies:  Allergies  Allergen Reactions  . Cefoxitin Hives    Social History:  Social History   Socioeconomic History  . Marital status: Single  Spouse name: Not on file  . Number of children: Not on file  . Years of education: Not on file  . Highest education level: Not on file  Occupational History  . Not on file  Tobacco Use  . Smoking status: Former Smoker    Packs/day: 0.50    Years: 10.00    Pack years: 5.00    Types: Cigarettes    Quit date: 11/28/2015    Years since quitting: 4.2  . Smokeless tobacco: Never Used  Substance and Sexual Activity  . Alcohol use: Not Currently  . Drug use: Never  . Sexual activity: Not on file  Other Topics Concern  . Not on file  Social History Narrative  . Not on file   Social Determinants of Health   Financial Resource Strain:   . Difficulty of Paying Living Expenses:   Food Insecurity:   . Worried About Charity fundraiser in the Last Year:   . Arboriculturist in the Last Year:    Transportation Needs:   . Film/video editor (Medical):   Marland Kitchen Lack of Transportation (Non-Medical):   Physical Activity:   . Days of Exercise per Week:   . Minutes of Exercise per Session:   Stress:   . Feeling of Stress :   Social Connections:   . Frequency of Communication with Friends and Family:   . Frequency of Social Gatherings with Friends and Family:   . Attends Religious Services:   . Active Member of Clubs or Organizations:   . Attends Archivist Meetings:   Marland Kitchen Marital Status:   Intimate Partner Violence:   . Fear of Current or Ex-Partner:   . Emotionally Abused:   Marland Kitchen Physically Abused:   . Sexually Abused:    Social History   Tobacco Use  Smoking Status Former Smoker  . Packs/day: 0.50  . Years: 10.00  . Pack years: 5.00  . Types: Cigarettes  . Quit date: 11/28/2015  . Years since quitting: 4.2  Smokeless Tobacco Never Used   Social History   Substance and Sexual Activity  Alcohol Use Not Currently    Family History:  Family History  Problem Relation Age of Onset  . Hypertension Mother   . Healthy Father   . Cancer Maternal Aunt     Past medical history, surgical history, medications, allergies, family history and social history reviewed with patient today and changes made to appropriate areas of the chart.   Review of Systems  Constitutional: Negative.   HENT: Positive for hearing loss and tinnitus. Negative for congestion, ear discharge, ear pain, nosebleeds, sinus pain and sore throat.   Eyes: Negative.   Respiratory: Negative.  Negative for stridor.   Cardiovascular: Negative.   Gastrointestinal: Negative.   Genitourinary: Negative.   Musculoskeletal: Negative.   Skin: Negative.   Neurological: Negative.   Endo/Heme/Allergies: Negative.   Psychiatric/Behavioral: Negative.     All other ROS negative except what is listed above and in the HPI.      Objective:    BP 134/83 (BP Location: Left Arm, Patient Position: Sitting,  Cuff Size: Large)   Pulse 78   Temp 98.1 F (36.7 C) (Oral)   Ht 6' 0.24" (1.835 m)   Wt (!) 352 lb 3.2 oz (159.8 kg)   SpO2 96%   BMI 47.44 kg/m   Wt Readings from Last 3 Encounters:  03/12/20 (!) 352 lb 3.2 oz (159.8 kg)  09/06/19 (!) 330 lb (149.7 kg)  02/21/19 (!) 338 lb 6.5 oz (153.5 kg)    Physical Exam Vitals and nursing note reviewed.  Constitutional:      General: He is not in acute distress.    Appearance: Normal appearance. He is obese. He is not ill-appearing, toxic-appearing or diaphoretic.  HENT:     Head: Normocephalic and atraumatic.     Right Ear: Tympanic membrane, ear canal and external ear normal. There is no impacted cerumen.     Left Ear: Tympanic membrane, ear canal and external ear normal. There is no impacted cerumen.     Nose: Nose normal. No congestion or rhinorrhea.     Mouth/Throat:     Mouth: Mucous membranes are moist.     Pharynx: Oropharynx is clear. No oropharyngeal exudate or posterior oropharyngeal erythema.  Eyes:     General: No scleral icterus.       Right eye: No discharge.        Left eye: No discharge.     Extraocular Movements: Extraocular movements intact.     Conjunctiva/sclera: Conjunctivae normal.     Pupils: Pupils are equal, round, and reactive to light.  Neck:     Vascular: No carotid bruit.  Cardiovascular:     Rate and Rhythm: Normal rate and regular rhythm.     Pulses: Normal pulses.     Heart sounds: No murmur. No friction rub. No gallop.   Pulmonary:     Effort: Pulmonary effort is normal. No respiratory distress.     Breath sounds: Normal breath sounds. No stridor. No wheezing, rhonchi or rales.  Chest:     Chest wall: No tenderness.  Abdominal:     General: Abdomen is flat. Bowel sounds are normal. There is no distension.     Palpations: Abdomen is soft. There is no mass.     Tenderness: There is no abdominal tenderness. There is no right CVA tenderness, left CVA tenderness, guarding or rebound.     Hernia: No  hernia is present.  Genitourinary:    Comments: Genital exam deferred with shared decision making Musculoskeletal:        General: No swelling, tenderness, deformity or signs of injury.     Cervical back: Normal range of motion and neck supple. No rigidity. No muscular tenderness.     Right lower leg: No edema.     Left lower leg: No edema.  Lymphadenopathy:     Cervical: No cervical adenopathy.  Skin:    General: Skin is warm and dry.     Capillary Refill: Capillary refill takes less than 2 seconds.     Coloration: Skin is not jaundiced or pale.     Findings: No bruising, erythema, lesion or rash.  Neurological:     General: No focal deficit present.     Mental Status: He is alert and oriented to person, place, and time.     Cranial Nerves: No cranial nerve deficit.     Sensory: No sensory deficit.     Motor: No weakness.     Coordination: Coordination normal.     Gait: Gait normal.     Deep Tendon Reflexes: Reflexes normal.  Psychiatric:        Mood and Affect: Mood normal.        Behavior: Behavior normal.        Thought Content: Thought content normal.        Judgment: Judgment normal.     Results for orders placed or performed in visit on 12/12/19  Comprehensive metabolic panel  Result Value Ref Range   Sodium 136 135 - 145 mmol/L   Potassium 3.6 3.5 - 5.1 mmol/L   Chloride 110 98 - 111 mmol/L   CO2 20 (L) 22 - 32 mmol/L   Glucose, Bld 101 (H) 70 - 99 mg/dL   BUN 20 6 - 20 mg/dL   Creatinine, Ser 1.611.20 0.61 - 1.24 mg/dL   Calcium 8.7 (L) 8.9 - 10.3 mg/dL   Total Protein 7.7 6.5 - 8.1 g/dL   Albumin 4.2 3.5 - 5.0 g/dL   AST 18 15 - 41 U/L   ALT 30 0 - 44 U/L   Alkaline Phosphatase 54 38 - 126 U/L   Total Bilirubin 0.5 0.3 - 1.2 mg/dL   GFR calc non Af Amer >60 >60 mL/min   GFR calc Af Amer >60 >60 mL/min   Anion gap 6 5 - 15  CBC with Differential/Platelet  Result Value Ref Range   WBC 7.6 4.0 - 10.5 K/uL   RBC 4.90 4.22 - 5.81 MIL/uL   Hemoglobin 14.5 13.0  - 17.0 g/dL   HCT 09.643.0 04.539.0 - 40.952.0 %   MCV 87.8 80.0 - 100.0 fL   MCH 29.6 26.0 - 34.0 pg   MCHC 33.7 30.0 - 36.0 g/dL   RDW 81.113.1 91.411.5 - 78.215.5 %   Platelets 235 150 - 400 K/uL   nRBC 0.0 0.0 - 0.2 %   Neutrophils Relative % 66 %   Neutro Abs 5.0 1.7 - 7.7 K/uL   Lymphocytes Relative 26 %   Lymphs Abs 1.9 0.7 - 4.0 K/uL   Monocytes Relative 6 %   Monocytes Absolute 0.5 0.1 - 1.0 K/uL   Eosinophils Relative 1 %   Eosinophils Absolute 0.1 0.0 - 0.5 K/uL   Basophils Relative 0 %   Basophils Absolute 0.0 0.0 - 0.1 K/uL   Immature Granulocytes 1 %   Abs Immature Granulocytes 0.04 0.00 - 0.07 K/uL      Assessment & Plan:   Problem List Items Addressed This Visit      Cardiovascular and Mediastinum   Pulmonary emboli (HCC)    Continue to follow with hematology. Call with any concerns. Continue to monitor.         Hematopoietic and Hemostatic   Factor V Leiden mutation [facility](HCC)    Continue to follow with hematology. Call with any concerns. Continue to monitor.         Other   Anxiety    Under good control on current regimen. Continue current regimen. Continue to monitor. Call with any concerns. Refills given. Using xanax very occasionally last Rx for 30 pills in 2019- refill given today.       Relevant Medications   sertraline (ZOLOFT) 100 MG tablet   ALPRAZolam (XANAX) 0.5 MG tablet    Other Visit Diagnoses    Routine general medical examination at a health care facility    -  Primary   Vaccines up to date. Screening labs checked today. Continue diet and exercise. Call with any concerns. Continue to monitor.    Relevant Orders   CBC with Differential/Platelet   Comprehensive metabolic panel   Lipid Panel w/o Chol/HDL Ratio   TSH   UA/M w/rflx Culture, Routine       LABORATORY TESTING:  Health maintenance labs ordered today as discussed above.   IMMUNIZATIONS:   - Tdap: Tetanus vaccination status reviewed: last tetanus booster within 10 years. - Influenza: Up to  date -  Pneumovax: Not applicable  PATIENT COUNSELING:    Sexuality: Discussed sexually transmitted diseases, partner selection, use of condoms, avoidance of unintended pregnancy  and contraceptive alternatives.   Advised to avoid cigarette smoking.  I discussed with the patient that most people either abstain from alcohol or drink within safe limits (<=14/week and <=4 drinks/occasion for males, <=7/weeks and <= 3 drinks/occasion for females) and that the risk for alcohol disorders and other health effects rises proportionally with the number of drinks per week and how often a drinker exceeds daily limits.  Discussed cessation/primary prevention of drug use and availability of treatment for abuse.   Diet: Encouraged to adjust caloric intake to maintain  or achieve ideal body weight, to reduce intake of dietary saturated fat and total fat, to limit sodium intake by avoiding high sodium foods and not adding table salt, and to maintain adequate dietary potassium and calcium preferably from fresh fruits, vegetables, and low-fat dairy products.    stressed the importance of regular exercise  Injury prevention: Discussed safety belts, safety helmets, smoke detector, smoking near bedding or upholstery.   Dental health: Discussed importance of regular tooth brushing, flossing, and dental visits.   Follow up plan: NEXT PREVENTATIVE PHYSICAL DUE IN 1 YEAR. Return in about 6 months (around 09/11/2020).

## 2020-03-12 NOTE — Patient Instructions (Addendum)
We are recommending the vaccine to everyone who has not had an allergic reaction to any of the components of the vaccine. If you have specific questions about the vaccine, please bring them up with your health care provider to discuss them.   We will likely not be getting the vaccine in the office for the first rounds of vaccinations. The way they are releasing the vaccines is going to be through the health systems (like [place], [place], [facility], [facility]), through your county health department, or through the pharmacies.   [facility] Department is giving vaccines to those 65+ and Health Care Workers Teachers and Child Care providers start 01/22/20, Essential workers start 3/10 and those with co-morbidities start 02/19/20 Call [phone] to schedule  If you are 65+ you can get a vaccine through [facility] by signing up for an appointment.  You can sign up by going to: [email or web address].  You can get more information by going to: [email or web address]  You can look up other placed to get the vaccine at [email or web address]   Health Maintenance, Male Adopting a healthy lifestyle and getting preventive care are important in promoting health and wellness. Ask your health care provider about:  The right schedule for you to have regular tests and exams.  Things you can do on your own to prevent diseases and keep yourself healthy. What should I know about diet, weight, and exercise? Eat a healthy diet   Eat a diet that includes plenty of vegetables, fruits, low-fat dairy products, and lean protein.  Do not eat a lot of foods that are high in solid fats, added sugars, or sodium. Maintain a healthy weight Body mass index (BMI) is a measurement that can be used to identify possible weight problems. It estimates body fat based on height and weight. Your health care provider can help determine your BMI and help you achieve or maintain a healthy weight. Get regular exercise Get regular  exercise. This is one of the most important things you can do for your health. Most adults should:  Exercise for at least 150 minutes each week. The exercise should increase your heart rate and make you sweat (moderate-intensity exercise).  Do strengthening exercises at least twice a week. This is in addition to the moderate-intensity exercise.  Spend less time sitting. Even light physical activity can be beneficial. Watch cholesterol and blood lipids Have your blood tested for lipids and cholesterol at 40 years of age, then have this test every 5 years. You may need to have your cholesterol levels checked more often if:  Your lipid or cholesterol levels are high.  You are older than 40 years of age.  You are at high risk for heart disease. What should I know about cancer screening? Many types of cancers can be detected early and may often be prevented. Depending on your health history and family history, you may need to have cancer screening at various ages. This may include screening for:  Colorectal cancer.  Prostate cancer.  Skin cancer.  Lung cancer. What should I know about heart disease, diabetes, and high blood pressure? Blood pressure and heart disease  High blood pressure causes heart disease and increases the risk of stroke. This is more likely to develop in people who have high blood pressure readings, are of African descent, or are overweight.  Talk with your health care provider about your target blood pressure readings.  Have your blood pressure checked: ? Every 3-5 years if you are 39-69 years of age. ?  Every year if you are 33 years old or older.  If you are between the ages of 97 and 98 and are a current or former smoker, ask your health care provider if you should have a one-time screening for abdominal aortic aneurysm (AAA). Diabetes Have regular diabetes screenings. This checks your fasting blood sugar level. Have the screening done:  Once every three  years after age 31 if you are at a normal weight and have a low risk for diabetes.  More often and at a younger age if you are overweight or have a high risk for diabetes. What should I know about preventing infection? Hepatitis B If you have a higher risk for hepatitis B, you should be screened for this virus. Talk with your health care provider to find out if you are at risk for hepatitis B infection. Hepatitis C Blood testing is recommended for:  Everyone born from [birth year] through 1966.  Anyone with known risk factors for hepatitis C. Sexually transmitted infections (STIs)  You should be screened each year for STIs, including gonorrhea and chlamydia, if: ? You are sexually active and are younger than 40 years of age. ? You are older than 40 years of age and your health care provider tells you that you are at risk for this type of infection. ? Your sexual activity has changed since you were last screened, and you are at increased risk for chlamydia or gonorrhea. Ask your health care provider if you are at risk.  Ask your health care provider about whether you are at high risk for HIV. Your health care provider may recommend a prescription medicine to help prevent HIV infection. If you choose to take medicine to prevent HIV, you should first get tested for HIV. You should then be tested every 3 months for as long as you are taking the medicine. Follow these instructions at home: Lifestyle  Do not use any products that contain nicotine or tobacco, such as cigarettes, e-cigarettes, and chewing tobacco. If you need help quitting, ask your health care provider.  Do not use street drugs.  Do not share needles.  Ask your health care provider for help if you need support or information about quitting drugs. Alcohol use  Do not drink alcohol if your health care provider tells you not to drink.  If you drink alcohol: ? Limit how much you have to 0-2 drinks a day. ? Be aware of how much  alcohol is in your drink. In the [place] one drink equals one 12 oz bottle of beer (355 mL), one 5 oz glass of wine (148 mL), or one 1 oz glass of hard liquor (44 mL). General instructions  Schedule regular health, dental, and eye exams.  Stay current with your vaccines.  Tell your health care provider if: ? You often feel depressed. ? You have ever been abused or do not feel safe at home. Summary  Adopting a healthy lifestyle and getting preventive care are important in promoting health and wellness.  Follow your health care provider's instructions about healthy diet, exercising, and getting tested or screened for diseases.  Follow your health care provider's instructions on monitoring your cholesterol and blood pressure. This information is not intended to replace advice given to you by your health care provider. Make sure you discuss any questions you have with your health care provider. Document Revised: 11/07/2018 Document Reviewed: 11/07/2018 Elsevier Patient Education  2020 [employer].

## 2020-03-12 NOTE — Assessment & Plan Note (Signed)
Under good control on current regimen. Continue current regimen. Continue to monitor. Call with any concerns. Refills given. Using xanax very occasionally last Rx for 30 pills in 2019- refill given today.

## 2020-03-13 LAB — CBC WITH DIFFERENTIAL/PLATELET
Basophils Absolute: 0 x10E3/uL (ref 0.0–0.2)
Basos: 0 %
EOS (ABSOLUTE): 0.1 x10E3/uL (ref 0.0–0.4)
Eos: 1 %
Hematocrit: 43.9 % (ref 37.5–51.0)
Hemoglobin: 14.7 g/dL (ref 13.0–17.7)
Immature Grans (Abs): 0 x10E3/uL (ref 0.0–0.1)
Immature Granulocytes: 0 %
Lymphocytes Absolute: 1.9 x10E3/uL (ref 0.7–3.1)
Lymphs: 27 %
MCH: 29.8 pg (ref 26.6–33.0)
MCHC: 33.5 g/dL (ref 31.5–35.7)
MCV: 89 fL (ref 79–97)
Monocytes Absolute: 0.4 x10E3/uL (ref 0.1–0.9)
Monocytes: 5 %
Neutrophils Absolute: 4.7 x10E3/uL (ref 1.4–7.0)
Neutrophils: 67 %
Platelets: 245 x10E3/uL (ref 150–450)
RBC: 4.93 x10E6/uL (ref 4.14–5.80)
RDW: 13 % (ref 11.6–15.4)
WBC: 7.1 x10E3/uL (ref 3.4–10.8)

## 2020-03-13 LAB — LIPID PANEL W/O CHOL/HDL RATIO
Cholesterol, Total: 177 mg/dL (ref 100–199)
HDL: 40 mg/dL (ref >39)
LDL Chol Calc (NIH): 115 mg/dL — ABNORMAL HIGH (ref 0–99)
Triglycerides: 120 mg/dL (ref 0–149)
VLDL Cholesterol Cal: 22 mg/dL (ref 5–40)

## 2020-03-13 LAB — COMPREHENSIVE METABOLIC PANEL
ALT: 37 IU/L (ref 0–44)
AST: 20 IU/L (ref 0–40)
Albumin/Globulin Ratio: 1.6 (ref 1.2–2.2)
Albumin: 4.1 g/dL (ref 4.0–5.0)
Alkaline Phosphatase: 64 IU/L (ref 39–117)
BUN/Creatinine Ratio: 10 (ref 9–20)
BUN: 13 mg/dL (ref 6–20)
Bilirubin Total: 0.7 mg/dL (ref 0.0–1.2)
CO2: 19 mmol/L — ABNORMAL LOW (ref 20–29)
Calcium: 9 mg/dL (ref 8.7–10.2)
Chloride: 109 mmol/L — ABNORMAL HIGH (ref 96–106)
Creatinine, Ser: 1.26 mg/dL (ref 0.76–1.27)
GFR calc Af Amer: 83 mL/min/{1.73_m2} (ref >59)
GFR calc non Af Amer: 71 mL/min/{1.73_m2} (ref >59)
Globulin, Total: 2.6 g/dL (ref 1.5–4.5)
Glucose: 113 mg/dL — ABNORMAL HIGH (ref 65–99)
Potassium: 3.6 mmol/L (ref 3.5–5.2)
Sodium: 140 mmol/L (ref 134–144)
Total Protein: 6.7 g/dL (ref 6.0–8.5)

## 2020-03-13 LAB — TSH: TSH: 2 u[IU]/mL (ref 0.450–4.500)

## 2020-03-18 ENCOUNTER — Inpatient Hospital Stay: Payer: BC Managed Care – PPO | Attending: Hematology and Oncology

## 2020-03-18 ENCOUNTER — Other Ambulatory Visit: Payer: Self-pay

## 2020-03-18 DIAGNOSIS — I2699 Other pulmonary embolism without acute cor pulmonale: Secondary | ICD-10-CM

## 2020-03-18 DIAGNOSIS — D6851 Activated protein C resistance: Secondary | ICD-10-CM | POA: Diagnosis not present

## 2020-03-18 LAB — COMPREHENSIVE METABOLIC PANEL
ALT: 33 U/L (ref 0–44)
AST: 17 U/L (ref 15–41)
Albumin: 4.1 g/dL (ref 3.5–5.0)
Alkaline Phosphatase: 56 U/L (ref 38–126)
Anion gap: 7 (ref 5–15)
BUN: 15 mg/dL (ref 6–20)
CO2: 22 mmol/L (ref 22–32)
Calcium: 8.4 mg/dL — ABNORMAL LOW (ref 8.9–10.3)
Chloride: 109 mmol/L (ref 98–111)
Creatinine, Ser: 1.29 mg/dL — ABNORMAL HIGH (ref 0.61–1.24)
GFR calc Af Amer: >60 mL/min (ref >60)
GFR calc non Af Amer: >60 mL/min (ref >60)
Glucose, Bld: 116 mg/dL — ABNORMAL HIGH (ref 70–99)
Potassium: 3.4 mmol/L — ABNORMAL LOW (ref 3.5–5.1)
Sodium: 138 mmol/L (ref 135–145)
Total Bilirubin: 0.9 mg/dL (ref 0.3–1.2)
Total Protein: 7.4 g/dL (ref 6.5–8.1)

## 2020-03-18 LAB — CBC WITH DIFFERENTIAL/PLATELET
Abs Immature Granulocytes: 0.03 10*3/uL (ref 0.00–0.07)
Basophils Absolute: 0 10*3/uL (ref 0.0–0.1)
Basophils Relative: 0 %
Eosinophils Absolute: 0.1 10*3/uL (ref 0.0–0.5)
Eosinophils Relative: 2 %
HCT: 42.3 % (ref 39.0–52.0)
Hemoglobin: 14.3 g/dL (ref 13.0–17.0)
Immature Granulocytes: 1 %
Lymphocytes Relative: 32 %
Lymphs Abs: 2.1 10*3/uL (ref 0.7–4.0)
MCH: 29.4 pg (ref 26.0–34.0)
MCHC: 33.8 g/dL (ref 30.0–36.0)
MCV: 87 fL (ref 80.0–100.0)
Monocytes Absolute: 0.4 10*3/uL (ref 0.1–1.0)
Monocytes Relative: 6 %
Neutro Abs: 4 10*3/uL (ref 1.7–7.7)
Neutrophils Relative %: 59 %
Platelets: 234 10*3/uL (ref 150–400)
RBC: 4.86 MIL/uL (ref 4.22–5.81)
RDW: 13.1 % (ref 11.5–15.5)
WBC: 6.6 10*3/uL (ref 4.0–10.5)
nRBC: 0 % (ref 0.0–0.2)

## 2020-04-04 ENCOUNTER — Other Ambulatory Visit: Payer: Self-pay | Admitting: Hematology and Oncology

## 2020-05-07 ENCOUNTER — Encounter: Payer: Self-pay | Admitting: Family Medicine

## 2020-06-09 ENCOUNTER — Ambulatory Visit (INDEPENDENT_AMBULATORY_CARE_PROVIDER_SITE_OTHER): Payer: BC Managed Care – PPO | Admitting: Family Medicine

## 2020-06-09 ENCOUNTER — Other Ambulatory Visit: Payer: Self-pay

## 2020-06-09 ENCOUNTER — Encounter: Payer: Self-pay | Admitting: Family Medicine

## 2020-06-09 VITALS — BP 138/83 | HR 77 | Temp 97.9°F | Wt 353.6 lb

## 2020-06-09 DIAGNOSIS — J3489 Other specified disorders of nose and nasal sinuses: Secondary | ICD-10-CM

## 2020-06-09 MED ORDER — MUPIROCIN 2 % EX OINT: 1.0000 "application " | TOPICAL_OINTMENT | Freq: Two times a day (BID) | CUTANEOUS | 0 refills | Status: DC

## 2020-06-09 NOTE — Progress Notes (Signed)
BP 138/83 (BP Location: Left Arm, Patient Position: Sitting, Cuff Size: Large)   Pulse 77   Temp 97.9 F (36.6 C) (Oral)   Wt (!) 353 lb 9.6 oz (160.4 kg)   SpO2 98%   BMI 47.63 kg/m    Subjective:    Patient ID: [facility], male    DOB: 04/19/1981, 39 y.o.   MRN: [id number]  HPI: Baldemar Dady is a 39 y.o. male  Chief Complaint  Patient presents with  . Sore    inside nose   Has been having soreness in his nostril, then gets congested, then it moves to the other side. Seems to be on the septum. He was noticing that it was raw and irritated. He notes that it has been going on for a few weeks, but his wife finally convinced him to come in. He has otherwise been feeling well. No fevers. No chills. No nosebleeds.   Relevant past medical, surgical, family and social history reviewed and updated as indicated. Interim medical history since our last visit reviewed. Allergies and medications reviewed and updated.  Review of Systems  Constitutional: Negative.   HENT: Positive for congestion. Negative for dental problem, drooling, ear discharge, ear pain, facial swelling, hearing loss, mouth sores, nosebleeds, postnasal drip, rhinorrhea, sinus pressure, sinus pain, sneezing, sore throat, tinnitus, trouble swallowing and voice change.   Respiratory: Negative.   Cardiovascular: Negative.   Gastrointestinal: Negative.   Psychiatric/Behavioral: Negative.     Per HPI unless specifically indicated above     Objective:    BP 138/83 (BP Location: Left Arm, Patient Position: Sitting, Cuff Size: Large)   Pulse 77   Temp 97.9 F (36.6 C) (Oral)   Wt (!) 353 lb 9.6 oz (160.4 kg)   SpO2 98%   BMI 47.63 kg/m   Wt Readings from Last 3 Encounters:  06/09/20 (!) 353 lb 9.6 oz (160.4 kg)  03/12/20 (!) 352 lb 3.2 oz (159.8 kg)  09/06/19 (!) 330 lb (149.7 kg)    Physical Exam Vitals and nursing note reviewed.  Constitutional:      General: He is not in acute distress.    Appearance:  Normal appearance. He is not ill-appearing, toxic-appearing or diaphoretic.  HENT:     Head: Normocephalic and atraumatic.     Right Ear: External ear normal.     Left Ear: External ear normal.     Nose: Nose normal.     Comments: Irritated L nasal septum with small sore.    Mouth/Throat:     Mouth: Mucous membranes are moist.     Pharynx: Oropharynx is clear.  Eyes:     General: No scleral icterus.       Right eye: No discharge.        Left eye: No discharge.     Extraocular Movements: Extraocular movements intact.     Conjunctiva/sclera: Conjunctivae normal.     Pupils: Pupils are equal, round, and reactive to light.  Cardiovascular:     Rate and Rhythm: Normal rate and regular rhythm.     Pulses: Normal pulses.     Heart sounds: Normal heart sounds. No murmur heard.  No friction rub. No gallop.   Pulmonary:     Effort: Pulmonary effort is normal. No respiratory distress.     Breath sounds: Normal breath sounds. No stridor. No wheezing, rhonchi or rales.  Chest:     Chest wall: No tenderness.  Musculoskeletal:        General: Normal range  of motion.     Cervical back: Normal range of motion and neck supple.  Skin:    General: Skin is warm and dry.     Capillary Refill: Capillary refill takes less than 2 seconds.     Coloration: Skin is not jaundiced or pale.     Findings: No bruising, erythema, lesion or rash.  Neurological:     General: No focal deficit present.     Mental Status: He is alert and oriented to person, place, and time. Mental status is at baseline.  Psychiatric:        Mood and Affect: Mood normal.        Behavior: Behavior normal.        Thought Content: Thought content normal.        Judgment: Judgment normal.     Results for orders placed or performed in visit on 03/18/20  Comprehensive metabolic panel  Result Value Ref Range   Sodium 138 135 - 145 mmol/L   Potassium 3.4 (L) 3.5 - 5.1 mmol/L   Chloride 109 98 - 111 mmol/L   CO2 22 22 - 32 mmol/L    Glucose, Bld 116 (H) 70 - 99 mg/dL   BUN 15 6 - 20 mg/dL   Creatinine, Ser 5.46 (H) 0.61 - 1.24 mg/dL   Calcium 8.4 (L) 8.9 - 10.3 mg/dL   Total Protein 7.4 6.5 - 8.1 g/dL   Albumin 4.1 3.5 - 5.0 g/dL   AST 17 15 - 41 U/L   ALT 33 0 - 44 U/L   Alkaline Phosphatase 56 38 - 126 U/L   Total Bilirubin 0.9 0.3 - 1.2 mg/dL   GFR calc non Af Amer >60 >60 mL/min   GFR calc Af Amer >60 >60 mL/min   Anion gap 7 5 - 15  CBC with Differential  Result Value Ref Range   WBC 6.6 4.0 - 10.5 K/uL   RBC 4.86 4.22 - 5.81 MIL/uL   Hemoglobin 14.3 13.0 - 17.0 g/dL   HCT 56.8 39 - 52 %   MCV 87.0 80.0 - 100.0 fL   MCH 29.4 26.0 - 34.0 pg   MCHC 33.8 30.0 - 36.0 g/dL   RDW 12.7 51.7 - 00.1 %   Platelets 234 150 - 400 K/uL   nRBC 0.0 0.0 - 0.2 %   Neutrophils Relative % 59 %   Neutro Abs 4.0 1.7 - 7.7 K/uL   Lymphocytes Relative 32 %   Lymphs Abs 2.1 0.7 - 4.0 K/uL   Monocytes Relative 6 %   Monocytes Absolute 0.4 0 - 1 K/uL   Eosinophils Relative 2 %   Eosinophils Absolute 0.1 0 - 0 K/uL   Basophils Relative 0 %   Basophils Absolute 0.0 0 - 0 K/uL   Immature Granulocytes 1 %   Abs Immature Granulocytes 0.03 0.00 - 0.07 K/uL      Assessment & Plan:   Problem List Items Addressed This Visit    None    Visit Diagnoses    Nasal sore    -  Primary   Will treat with bactroban. Call if not getting better or getting worse.        Follow up plan: Return if symptoms worsen or fail to improve.

## 2020-06-10 ENCOUNTER — Encounter: Payer: Self-pay | Admitting: Hematology and Oncology

## 2020-06-17 ENCOUNTER — Other Ambulatory Visit: Payer: BC Managed Care – PPO

## 2020-06-17 ENCOUNTER — Inpatient Hospital Stay: Payer: BC Managed Care – PPO | Admitting: Hematology and Oncology

## 2020-06-17 NOTE — Progress Notes (Deleted)
[facility]  [address] [place], [place] [address] Phone: [phone]  Fax: [phone]   Office Visit:  06/17/2020  Referring physician: Dorcas Carrow, DO  Chief Complaint: Shane [facility] is a 39 y.o. male with pulmonary embolism and heterozygosity for Factor V Leiden who is seen for 6 month reassessment.    HPI:  The patient was last seen in the hematology clinic on 12/18/2019. At that time, he was doing well.  He denied any shortness of breath or lower extremity edema.  He denied any bruising or bleeding. 12/12/2019 Hematocrit was 43.0, hemoglobin 14.5, MCV 87.7, platelets 235,000, WBC 7600, ANC 5000. We discussed continuation of Eliquis.  During the interim, ***   Past Medical History:  Diagnosis Date  . Anxiety   . Blood clot in vein   . Idiopathic intracranial hypertension   . Sleep apnea     Past Surgical History:  Procedure Laterality Date  . club feet      Family History  Problem Relation Age of Onset  . Hypertension Mother   . Healthy Father   . Cancer Maternal Aunt     Social History:  reports that he quit smoking about 4 years ago. His smoking use included cigarettes. He has a 5.00 pack-year smoking history. He has never used smokeless tobacco. He reports previous alcohol use. He reports that he does not use drugs. He previously smoked 1/2 pack/day x 10 years.  He rarely uses alcohol. Patient denies known exposures to radiation on toxins.  He works in an office. The patient is {Blank single:19197::"alone","accompanied by"} *** today.   Allergies:  Allergies  Allergen Reactions  . Cefoxitin Hives    Current Medications: Current Outpatient Medications  Medication Sig Dispense Refill  . acetaZOLAMIDE (DIAMOX) 250 MG tablet Take 1,500 mg by mouth 2 (two) times daily.    Marland Kitchen ALPRAZolam (XANAX) 0.5 MG tablet alprazolam 0.5 mg tablet daily PRN 30 tablet 0  . ELIQUIS 5 MG TABS tablet TAKE 1 TABLET(5 MG) BY MOUTH TWICE DAILY  60 tablet 2  . mupirocin ointment (BACTROBAN) 2 % Place 1 application into the nose 2 (two) times daily. 22 g 0  . omeprazole (PRILOSEC) 20 MG capsule Take 1 capsule (20 mg total) by mouth daily. 90 capsule 3  . sertraline (ZOLOFT) 100 MG tablet Take 2 tablets (200 mg total) by mouth daily. 180 tablet 1   No current facility-administered medications for this visit.    Review of Systems  Constitutional: Negative.  Negative for chills, diaphoresis, fever and malaise/fatigue.       Feels "ok".  HENT: Negative.  Negative for congestion, ear discharge, ear pain, nosebleeds, sinus pain, sore throat and tinnitus.   Eyes: Negative.  Negative for blurred vision, double vision, photophobia, pain, discharge and redness.  Respiratory: Negative.  Negative for cough, hemoptysis, sputum production, shortness of breath and wheezing.        Sleep apnea on CPAP.  Cardiovascular: Negative.  Negative for chest pain, palpitations, orthopnea, leg swelling and PND.  Gastrointestinal: Negative.  Negative for abdominal pain, blood in stool, constipation, diarrhea, heartburn, nausea and vomiting.  Genitourinary: Negative.  Negative for dysuria, frequency, hematuria and urgency.  Musculoskeletal: Negative.  Negative for back pain, falls, joint pain, myalgias and neck pain.       Left sided chest wall pain, resolved.  Skin: Negative.  Negative for itching and rash.  Neurological: Negative.  Negative for dizziness, tingling, tremors, sensory change, speech change, focal weakness,  weakness and headaches.  Endo/Heme/Allergies: Negative.  Does not bruise/bleed easily.  Psychiatric/Behavioral: Negative.  Negative for depression, hallucinations, memory loss and substance abuse. The patient is not nervous/anxious and does not have insomnia.   All other systems reviewed and are negative.  Performance status (ECOG): 0 - Asymptomatic   Vitals There were no vitals taken for this visit.   Physical Exam Nursing note  reviewed.  Constitutional:      General: He is not in acute distress.    Appearance: He is well-developed. He is not diaphoretic.  HENT:     Head: Atraumatic.  Eyes:     General: No scleral icterus.    Conjunctiva/sclera: Conjunctivae normal.     Comments: Blue eyes.  Neurological:     Mental Status: He is alert and oriented to person, place, and time.  Psychiatric:        Behavior: Behavior normal.        Thought Content: Thought content normal.        Judgment: Judgment normal.     No visits with results within 3 Day(s) from this visit.  Latest known visit with results is:  Appointment on 03/18/2020  Component Date Value Ref Range Status  . Sodium 03/18/2020 138  135 - 145 mmol/L Final  . Potassium 03/18/2020 3.4* 3.5 - 5.1 mmol/L Final  . Chloride 03/18/2020 109  98 - 111 mmol/L Final  . CO2 03/18/2020 22  22 - 32 mmol/L Final  . Glucose, Bld 03/18/2020 116* 70 - 99 mg/dL Final   Glucose reference range applies only to samples taken after fasting for at least 8 hours.  . BUN 03/18/2020 15  6 - 20 mg/dL Final  . Creatinine, Ser 03/18/2020 1.29* 0.61 - 1.24 mg/dL Final  . Calcium 84/13/244004/21/2021 8.4* 8.9 - 10.3 mg/dL Final  . Total Protein 03/18/2020 7.4  6.5 - 8.1 g/dL Final  . Albumin 10/27/253604/21/2021 4.1  3.5 - 5.0 g/dL Final  . AST 64/40/347404/21/2021 17  15 - 41 U/L Final  . ALT 03/18/2020 33  0 - 44 U/L Final  . Alkaline Phosphatase 03/18/2020 56  38 - 126 U/L Final  . Total Bilirubin 03/18/2020 0.9  0.3 - 1.2 mg/dL Final  . GFR calc non Af Amer 03/18/2020 >60  >60 mL/min Final  . GFR calc Af Amer 03/18/2020 >60  >60 mL/min Final  . Anion gap 03/18/2020 7  5 - 15 Final   Performed at [facility], [address] [place], [place] [id number]  . WBC 03/18/2020 6.6  4.0 - 10.5 K/uL Final  . RBC 03/18/2020 4.86  4.22 - 5.81 MIL/uL Final  . Hemoglobin 03/18/2020 14.3  13.0 - 17.0 g/dL Final  . HCT 38/75/643304/21/2021 42.3  39 - 52 % Final  . MCV 03/18/2020 87.0  80.0 - 100.0 fL Final    . MCH 03/18/2020 29.4  26.0 - 34.0 pg Final  . MCHC 03/18/2020 33.8  30.0 - 36.0 g/dL Final  . RDW 29/51/884104/21/2021 13.1  11.5 - 15.5 % Final  . Platelets 03/18/2020 234  150 - 400 K/uL Final  . nRBC 03/18/2020 0.0  0.0 - 0.2 % Final  . Neutrophils Relative % 03/18/2020 59  % Final  . Neutro Abs 03/18/2020 4.0  1.7 - 7.7 K/uL Final  . Lymphocytes Relative 03/18/2020 32  % Final  . Lymphs Abs 03/18/2020 2.1  0.7 - 4.0 K/uL Final  . Monocytes Relative 03/18/2020 6  % Final  . Monocytes Absolute  03/18/2020 0.4  0 - 1 K/uL Final  . Eosinophils Relative 03/18/2020 2  % Final  . Eosinophils Absolute 03/18/2020 0.1  0 - 0 K/uL Final  . Basophils Relative 03/18/2020 0  % Final  . Basophils Absolute 03/18/2020 0.0  0 - 0 K/uL Final  . Immature Granulocytes 03/18/2020 1  % Final  . Abs Immature Granulocytes 03/18/2020 0.03  0.00 - 0.07 K/uL Final   Performed at [facility], [address] [place], [place] [address]    Assessment:  Shane Oneill is a 39 y.o. male with a history of right lower extremity superficial thrombosis x 3 and a history of bilateral pulmonary emboli.  He denies any precipating events.  He sits at a desk (sedentary).  He denies any family history of thrombosis.  He is on Eliquis.  Chest CT angiogram on 04/20/2018 revealed multiple acute segmental pulmonary emboli in the lungs bilaterally.  There were small left upper lobe lingula and dependent left lower lobe pulmonary infarctions.  There was a small left pleural effusion.  Bilateral lower extremity duplex on 05/04/2018 revealed no evidence of DVT within either lower extremity.  Hypercoagulable work-up on 04/30/2018 revealed Factor V Leiden (single [id number] mutation).  Normal studies included:  prothrombin gene mutation, lupus anticoagulant panel, anticardiolipin antibodies, beta-2 glycoprotein antibodies.  Normal studies on 08/09/2018 included protein C antigen (97%) and activity (129%), protein S antigen (74%) and  activity (68%) and ATIII antigen (92%) and activity (110%).  Symptomatically,   Plan: 1.   Labs today: CBC with diff, CMP  2.   Bilateral pulmonary emboli             Review interim events.             Rational for indefinite anticoagulation                         Significant bilateral pulmonary emboli.                          Recurrence risk after discontinuation of anticoagulation after 1st unprovoked VTE:    10% at one year and 30% at five years (5% per year after the first year).                           Decrease in recurrence outweighs the rate of bleeding with full anticoagulation                            AMPLIFY-EXT study    Approximtely 2500 patients who had completed 6-12 months of Eliquis or Coumadin.    Patients randomized to Eliquis (therapeutic or prophylactic dosing) or placebo.    Risk of VTE or VTE-related death (1.7% vs 8.8%)     Risk of major bleeding (0.1-0.2% vs 0.5%); non-major bleeding (3.0-4.2% vs 2.3%).  Discuss continuation of Eliquis. 3.   Chest wall pain, resolved             Continue to monitor. 4.   RTC in 3 months for labs (CBC with diff, CMP). 5.   RTC in 6 months for MD assessment and labs (CBC with diff, CMP).   I discussed the assessment and treatment plan with the patient.  The patient was provided an opportunity to ask questions and all were answered.  The patient agreed with the plan and  demonstrated an understanding of the instructions.  The patient was advised to call back if the symptoms worsen or if the condition fails to improve as anticipated.  I provided *** minutes (8:58 AM - 8:58 AM) of face-to-face time during this this encounter and > 50% was spent counseling as documented under my assessment and plan.    Rosey Bath, MD, PhD    06/17/2020, 8:58 AM

## 2020-06-22 ENCOUNTER — Other Ambulatory Visit: Payer: BC Managed Care – PPO

## 2020-06-22 ENCOUNTER — Ambulatory Visit: Payer: BC Managed Care – PPO | Admitting: Hematology and Oncology

## 2020-07-07 ENCOUNTER — Other Ambulatory Visit: Payer: Self-pay | Admitting: Hematology and Oncology

## 2020-08-27 DIAGNOSIS — G4733 Obstructive sleep apnea (adult) (pediatric): Secondary | ICD-10-CM | POA: Diagnosis not present

## 2020-09-05 ENCOUNTER — Other Ambulatory Visit: Payer: Self-pay | Admitting: Family Medicine

## 2020-09-11 ENCOUNTER — Other Ambulatory Visit: Payer: Self-pay

## 2020-09-11 ENCOUNTER — Ambulatory Visit (INDEPENDENT_AMBULATORY_CARE_PROVIDER_SITE_OTHER): Payer: BC Managed Care – PPO | Admitting: Family Medicine

## 2020-09-11 ENCOUNTER — Encounter: Payer: Self-pay | Admitting: Family Medicine

## 2020-09-11 VITALS — BP 134/85 | HR 78 | Temp 98.5°F | Wt 353.8 lb

## 2020-09-11 DIAGNOSIS — Z23 Encounter for immunization: Secondary | ICD-10-CM | POA: Diagnosis not present

## 2020-09-11 DIAGNOSIS — R7989 Other specified abnormal findings of blood chemistry: Secondary | ICD-10-CM

## 2020-09-11 DIAGNOSIS — R5382 Chronic fatigue, unspecified: Secondary | ICD-10-CM

## 2020-09-11 DIAGNOSIS — F419 Anxiety disorder, unspecified: Secondary | ICD-10-CM | POA: Diagnosis not present

## 2020-09-11 MED ORDER — SERTRALINE HCL 100 MG PO TABS: 200.0000 mg | ORAL_TABLET | Freq: Every day | ORAL | 1 refills | Status: DC

## 2020-09-11 MED ORDER — OMEPRAZOLE 20 MG PO CPDR: DELAYED_RELEASE_CAPSULE | ORAL | 3 refills | Status: DC

## 2020-09-11 NOTE — Progress Notes (Signed)
error 

## 2020-09-11 NOTE — Progress Notes (Signed)
BP 134/85   Pulse 78   Temp 98.5 F (36.9 C) (Oral)   Wt (!) 353 lb 12.8 oz (160.5 kg)   BMI 47.66 kg/m    Subjective:    Patient ID: Shane One[facility], male    DOB: [birth year], 39 y.o.   MRN: [id number]  HPI: Shane Oneill is a 39 y.o. male  Chief Complaint  Patient presents with  . Anxiety   ANXIETY/STRESS- needing to use the alprazolam very occasionally Duration: chronic Status:stable Anxious mood: yes  Excessive worrying: no Irritability: no  Sweating: no Nausea: no Palpitations:no Hyperventilation: no Panic attacks: no Agoraphobia: no  Obscessions/compulsions: no Depressed mood: no Depression screen [facility] 2/9 03/12/2020 09/06/2019 10/03/2018 08/15/2018  Decreased Interest 0 0 0 0  Down, Depressed, Hopeless 0 0 0 0  PHQ - 2 Score 0 0 0 0  Altered sleeping 1 0 0 0  Tired, decreased energy 0 0 0 0  Change in appetite 0 0 0 0  Feeling bad or failure about yourself  0 0 0 0  Trouble concentrating 0 0 0 0  Moving slowly or fidgety/restless 0 0 0 0  Suicidal thoughts 0 0 0 0  PHQ-9 Score 1 0 0 0  Difficult doing work/chores - - Not difficult at all Not difficult at all   GAD 7 : Generalized Anxiety Score 09/11/2020 03/12/2020 09/06/2019 10/03/2018  Nervous, Anxious, on Edge 0 1 0 1  Control/stop worrying 0 0 0 0  Worry too much - different things 0 0 1 0  Trouble relaxing 0 0 0 0  Restless 0 0 0 0  Easily annoyed or irritable 0 1 1 1   Afraid - awful might happen 0 0 0 0  Total GAD 7 Score 0 2 2 2   Anxiety Difficulty Not difficult at all Not difficult at all Not difficult at all Not difficult at all   Anhedonia: no Weight changes: no Insomnia: no   Hypersomnia: no Fatigue/loss of energy: no Feelings of worthlessness: no Feelings of guilt: no Impaired concentration/indecisiveness: no Suicidal ideations: no  Crying spells: no Recent Stressors/Life Changes: no   Relationship problems: no   Family stress: no     Financial stress: no    Job stress: no    Recent  death/loss: no  ????LOW TESTOSTERONE Duration: chronic Decreased libido: no Fatigue: yes Depressed mood: yes Muscle weakness: no Erectile dysfunction: no  Relevant past medical, surgical, family and social history reviewed and updated as indicated. Interim medical history since our last visit reviewed. Allergies and medications reviewed and updated.  Review of Systems  Constitutional: Positive for fatigue. Negative for activity change, appetite change, chills, diaphoresis, fever and unexpected weight change.  HENT: Negative.   Respiratory: Negative.   Cardiovascular: Negative.   Musculoskeletal: Negative.   Neurological: Negative.   Psychiatric/Behavioral: Negative.     Per HPI unless specifically indicated above     Objective:    BP 134/85   Pulse 78   Temp 98.5 F (36.9 C) (Oral)   Wt (!) 353 lb 12.8 oz (160.5 kg)   BMI 47.66 kg/m   Wt Readings from Last 3 Encounters:  09/11/20 (!) 353 lb 12.8 oz (160.5 kg)  06/09/20 (!) 353 lb 9.6 oz (160.4 kg)  03/12/20 (!) 352 lb 3.2 oz (159.8 kg)    Physical Exam Vitals and nursing note reviewed.  Constitutional:      General: He is not in acute distress.    Appearance: Normal appearance. He is not ill-appearing,  toxic-appearing or diaphoretic.  HENT:     Head: Normocephalic and atraumatic.     Right Ear: External ear normal.     Left Ear: External ear normal.     Nose: Nose normal.     Mouth/Throat:     Mouth: Mucous membranes are moist.     Pharynx: Oropharynx is clear.  Eyes:     General: No scleral icterus.       Right eye: No discharge.        Left eye: No discharge.     Extraocular Movements: Extraocular movements intact.     Conjunctiva/sclera: Conjunctivae normal.     Pupils: Pupils are equal, round, and reactive to light.  Cardiovascular:     Rate and Rhythm: Normal rate and regular rhythm.     Pulses: Normal pulses.     Heart sounds: Normal heart sounds. No murmur heard.  No friction rub. No gallop.    Pulmonary:     Effort: Pulmonary effort is normal. No respiratory distress.     Breath sounds: Normal breath sounds. No stridor. No wheezing, rhonchi or rales.  Chest:     Chest wall: No tenderness.  Musculoskeletal:        General: Normal range of motion.     Cervical back: Normal range of motion and neck supple.  Skin:    General: Skin is warm and dry.     Capillary Refill: Capillary refill takes less than 2 seconds.     Coloration: Skin is not jaundiced or pale.     Findings: No bruising, erythema, lesion or rash.  Neurological:     General: No focal deficit present.     Mental Status: He is alert and oriented to person, place, and time. Mental status is at baseline.  Psychiatric:        Mood and Affect: Mood normal.        Behavior: Behavior normal.        Thought Content: Thought content normal.        Judgment: Judgment normal.     Results for orders placed or performed in visit on 09/11/20  Testosterone, free, total([facility])  Result Value Ref Range   Testosterone 80 (L) 264 - 916 ng/dL   Testosterone, Free WILL FOLLOW    Sex Hormone Binding 21.7 16.5 - 55.9 nmol/L  TSH  Result Value Ref Range   TSH 1.640 0.450 - 4.500 uIU/mL  CBC with Differential/Platelet  Result Value Ref Range   WBC 7.8 3.4 - 10.8 x10E3/uL   RBC 4.81 4.14 - 5.80 x10E6/uL   Hemoglobin 14.1 13.0 - 17.7 g/dL   Hematocrit 38.9 37.3 - 51.0 %   MCV 89 79 - 97 fL   MCH 29.3 26.6 - 33.0 pg   MCHC 32.9 31 - 35 g/dL   RDW 42.8 76.8 - 11.5 %   Platelets 243 150 - 450 x10E3/uL   Neutrophils 65 Not Estab. %   Lymphs 27 Not Estab. %   Monocytes 5 Not Estab. %   Eos 2 Not Estab. %   Basos 0 Not Estab. %   Neutrophils Absolute 5.1 1 - 7 x10E3/uL   Lymphocytes Absolute 2.1 0 - 3 x10E3/uL   Monocytes Absolute 0.4 0 - 0 x10E3/uL   EOS (ABSOLUTE) 0.1 0.0 - 0.4 x10E3/uL   Basophils Absolute 0.0 0 - 0 x10E3/uL   Immature Granulocytes 1 Not Estab. %   Immature Grans (Abs) 0.0 0.0 - 0.1 x10E3/uL   Comprehensive metabolic panel  Result Value Ref Range   Glucose 138 (H) 65 - 99 mg/dL   BUN 13 6 - 20 mg/dL   Creatinine, Ser 3.15 0.76 - 1.27 mg/dL   GFR calc non Af Amer 71 >59 mL/min/1.73   GFR calc Af Amer 83 >59 mL/min/1.73   BUN/Creatinine Ratio 10 9 - 20   Sodium 142 134 - 144 mmol/L   Potassium 3.8 3.5 - 5.2 mmol/L   Chloride 110 (H) 96 - 106 mmol/L   CO2 20 20 - 29 mmol/L   Calcium 8.8 8.7 - 10.2 mg/dL   Total Protein 7.2 6.0 - 8.5 g/dL   Albumin 4.3 4.0 - 5.0 g/dL   Globulin, Total 2.9 1.5 - 4.5 g/dL   Albumin/Globulin Ratio 1.5 1.2 - 2.2   Bilirubin Total 0.6 0.0 - 1.2 mg/dL   Alkaline Phosphatase 72 44 - 121 IU/L   AST 19 0 - 40 IU/L   ALT 27 0 - 44 IU/L      Assessment & Plan:   Problem List Items Addressed This Visit      Other   Anxiety - Primary    Under good control on current regimen. Continue current regimen. Continue to monitor. Call with any concerns. Refills given. Does not need refill on xanax right now. Will call when he needs it. Call with any concerns. Continue to monitor.        Relevant Medications   sertraline (ZOLOFT) 100 MG tablet    Other Visit Diagnoses    Chronic fatigue       Labs drawn today. Await results. Treat as needed.    Relevant Orders   Testosterone, free, total([facility]) (Completed)   TSH (Completed)   CBC with Differential/Platelet (Completed)   Comprehensive metabolic panel (Completed)   Need for influenza vaccination       Flu shot given today.   Relevant Orders   Flu Vaccine QUAD 36+ mos IM (Completed)       Follow up plan: Return in about 6 months (around 03/12/2021).

## 2020-09-13 NOTE — Assessment & Plan Note (Signed)
Under good control on current regimen. Continue current regimen. Continue to monitor. Call with any concerns. Refills given. Does not need refill on xanax right now. Will call when he needs it. Call with any concerns. Continue to monitor.

## 2020-09-13 NOTE — Addendum Note (Signed)
Addended by: Dorcas Carrow on: 09/13/2020 08:28 PM   Modules accepted: Orders

## 2020-09-16 LAB — COMPREHENSIVE METABOLIC PANEL
ALT: 27 IU/L (ref 0–44)
AST: 19 IU/L (ref 0–40)
Albumin/Globulin Ratio: 1.5 (ref 1.2–2.2)
Albumin: 4.3 g/dL (ref 4.0–5.0)
Alkaline Phosphatase: 72 IU/L (ref 44–121)
BUN/Creatinine Ratio: 10 (ref 9–20)
BUN: 13 mg/dL (ref 6–20)
Bilirubin Total: 0.6 mg/dL (ref 0.0–1.2)
CO2: 20 mmol/L (ref 20–29)
Calcium: 8.8 mg/dL (ref 8.7–10.2)
Chloride: 110 mmol/L — ABNORMAL HIGH (ref 96–106)
Creatinine, Ser: 1.26 mg/dL (ref 0.76–1.27)
GFR calc Af Amer: 83 mL/min/{1.73_m2} (ref >59)
GFR calc non Af Amer: 71 mL/min/{1.73_m2} (ref >59)
Globulin, Total: 2.9 g/dL (ref 1.5–4.5)
Glucose: 138 mg/dL — ABNORMAL HIGH (ref 65–99)
Potassium: 3.8 mmol/L (ref 3.5–5.2)
Sodium: 142 mmol/L (ref 134–144)
Total Protein: 7.2 g/dL (ref 6.0–8.5)

## 2020-09-16 LAB — CBC WITH DIFFERENTIAL/PLATELET
Basophils Absolute: 0 10*3/uL (ref 0.0–0.2)
Basos: 0 %
EOS (ABSOLUTE): 0.1 10*3/uL (ref 0.0–0.4)
Eos: 2 %
Hematocrit: 42.8 % (ref 37.5–51.0)
Hemoglobin: 14.1 g/dL (ref 13.0–17.7)
Immature Grans (Abs): 0 10*3/uL (ref 0.0–0.1)
Immature Granulocytes: 1 %
Lymphocytes Absolute: 2.1 10*3/uL (ref 0.7–3.1)
Lymphs: 27 %
MCH: 29.3 pg (ref 26.6–33.0)
MCHC: 32.9 g/dL (ref 31.5–35.7)
MCV: 89 fL (ref 79–97)
Monocytes Absolute: 0.4 10*3/uL (ref 0.1–0.9)
Monocytes: 5 %
Neutrophils Absolute: 5.1 10*3/uL (ref 1.4–7.0)
Neutrophils: 65 %
Platelets: 243 10*3/uL (ref 150–450)
RBC: 4.81 x10E6/uL (ref 4.14–5.80)
RDW: 12.8 % (ref 11.6–15.4)
WBC: 7.8 10*3/uL (ref 3.4–10.8)

## 2020-09-16 LAB — TESTOSTERONE, FREE, TOTAL, SHBG
Sex Hormone Binding: 21.7 nmol/L (ref 16.5–55.9)
Testosterone, Free: 6.4 pg/mL — ABNORMAL LOW (ref 8.7–25.1)
Testosterone: 80 ng/dL — ABNORMAL LOW (ref 264–916)

## 2020-09-16 LAB — TSH: TSH: 1.64 u[IU]/mL (ref 0.450–4.500)

## 2020-09-29 ENCOUNTER — Telehealth: Payer: Self-pay | Admitting: Hematology and Oncology

## 2020-09-29 ENCOUNTER — Other Ambulatory Visit: Payer: Self-pay | Admitting: Hematology and Oncology

## 2020-09-29 NOTE — Telephone Encounter (Signed)
Steward Drone, this is a Dr. Merlene Pulling patient.

## 2020-09-29 NOTE — Telephone Encounter (Signed)
I called Shane [facility]ill to reschedule his appt. He needs to see Dr before  Everlene Balls can be refilled. I left a VM for him to call me back.

## 2020-11-05 ENCOUNTER — Other Ambulatory Visit: Payer: Self-pay | Admitting: Hematology and Oncology

## 2020-11-29 ENCOUNTER — Other Ambulatory Visit: Payer: Self-pay | Admitting: Family Medicine

## 2020-11-30 ENCOUNTER — Other Ambulatory Visit: Payer: Self-pay | Admitting: Family Medicine

## 2020-12-01 ENCOUNTER — Other Ambulatory Visit: Payer: Self-pay

## 2020-12-01 ENCOUNTER — Encounter: Payer: Self-pay | Admitting: Family Medicine

## 2020-12-01 NOTE — Telephone Encounter (Signed)
Sent refill request to Dr Laural Benes

## 2020-12-02 MED ORDER — SERTRALINE HCL 100 MG PO TABS
200.0000 mg | ORAL_TABLET | Freq: Every day | ORAL | 0 refills | Status: DC
Start: 2020-12-02 — End: 2021-03-01

## 2020-12-02 MED ORDER — OMEPRAZOLE 20 MG PO CPDR
DELAYED_RELEASE_CAPSULE | ORAL | 0 refills | Status: DC
Start: 2020-12-02 — End: 2021-03-01

## 2020-12-04 ENCOUNTER — Other Ambulatory Visit: Payer: Self-pay | Admitting: Hematology and Oncology

## 2020-12-05 ENCOUNTER — Other Ambulatory Visit: Payer: Self-pay | Admitting: Hematology and Oncology

## 2021-03-01 ENCOUNTER — Other Ambulatory Visit: Payer: Self-pay | Admitting: Family Medicine

## 2021-03-01 ENCOUNTER — Telehealth: Payer: Self-pay

## 2021-03-01 ENCOUNTER — Encounter: Payer: Self-pay | Admitting: Family Medicine

## 2021-03-01 NOTE — Telephone Encounter (Signed)
Called pt to r/s 4/15 visit no answer left vm

## 2021-03-01 NOTE — Telephone Encounter (Signed)
Requested Prescriptions  Pending Prescriptions Disp Refills  . omeprazole (PRILOSEC) 20 MG capsule [Pharmacy Med Name: OMEPRAZOLE 20MG  CAPSULES] 90 capsule 0    Sig: TAKE 1 CAPSULE(20 MG) BY MOUTH DAILY     Gastroenterology: Proton Pump Inhibitors Passed - 03/01/2021  3:19 AM      Passed - Valid encounter within last 12 months    Recent Outpatient Visits          5 months ago Anxiety   [facility] [place], Megan P, DO   8 months ago Nasal sore   [facility] [place], Megan P, DO   11 months ago Routine general medical examination at a health care facility   [facility], [place], DO   1 year ago Factor V Leiden mutation [facility])   [facility] [place], [place], DO   2 years ago OSA on CPAP   [facility] [place], [place], DO      Future Appointments            In 1 week [facility], Laural Benes, DO Oralia Rud, [facility]

## 2021-03-01 NOTE — Telephone Encounter (Signed)
Requested Prescriptions  Pending Prescriptions Disp Refills  . sertraline (ZOLOFT) 100 MG tablet [Pharmacy Med Name: SERTRALINE 100MG  TABLETS] 180 tablet 0    Sig: TAKE 2 TABLETS(200 MG) BY MOUTH DAILY     Psychiatry:  Antidepressants - SSRI Passed - 03/01/2021  7:23 AM      Passed - Valid encounter within last 6 months    Recent Outpatient Visits          5 months ago Anxiety   [facility] [place], Megan P, DO   8 months ago Nasal sore   [facility] [place], Megan P, DO   11 months ago Routine general medical examination at a health care facility   [facility] [place], DO   1 year ago Factor V Leiden mutation [facility])   [facility] [place], [place], DO   2 years ago OSA on CPAP   [facility] [place], [place], DO      Future Appointments            In 1 week [facility], Laural Benes, DO Oralia Rud, [facility]

## 2021-03-04 ENCOUNTER — Encounter: Payer: Self-pay | Admitting: Family Medicine

## 2021-03-05 ENCOUNTER — Other Ambulatory Visit: Payer: Self-pay | Admitting: Hematology and Oncology

## 2021-03-05 MED ORDER — ELIQUIS 5 MG PO TABS: 5.0000 mg | ORAL_TABLET | Freq: Two times a day (BID) | ORAL | 1 refills | Status: DC

## 2021-03-08 ENCOUNTER — Telehealth: Payer: Self-pay | Admitting: *Deleted

## 2021-03-08 NOTE — Telephone Encounter (Signed)
error 

## 2021-03-09 ENCOUNTER — Telehealth: Payer: Self-pay

## 2021-03-09 NOTE — Telephone Encounter (Signed)
PA for Eliquis was denied. Insurance prefers patient try xarelto first. Could this drug be changed? Please advise.

## 2021-03-10 NOTE — Telephone Encounter (Signed)
I just realized it has been over a year 11/2019. Would you like him to be seen soon?

## 2021-03-10 NOTE — Telephone Encounter (Signed)
Yes insurance prefers xarelto. Thank you

## 2021-03-11 ENCOUNTER — Ambulatory Visit (INDEPENDENT_AMBULATORY_CARE_PROVIDER_SITE_OTHER): Payer: BC Managed Care – PPO | Admitting: Family Medicine

## 2021-03-11 ENCOUNTER — Other Ambulatory Visit: Payer: Self-pay

## 2021-03-11 DIAGNOSIS — I2699 Other pulmonary embolism without acute cor pulmonale: Secondary | ICD-10-CM

## 2021-03-11 DIAGNOSIS — R7989 Other specified abnormal findings of blood chemistry: Secondary | ICD-10-CM

## 2021-03-11 DIAGNOSIS — Z1159 Encounter for screening for other viral diseases: Secondary | ICD-10-CM

## 2021-03-11 DIAGNOSIS — Z13220 Encounter for screening for lipoid disorders: Secondary | ICD-10-CM

## 2021-03-11 DIAGNOSIS — Z114 Encounter for screening for human immunodeficiency virus [HIV]: Secondary | ICD-10-CM

## 2021-03-11 DIAGNOSIS — Z5320 Procedure and treatment not carried out because of patient's decision for unspecified reasons: Secondary | ICD-10-CM

## 2021-03-11 DIAGNOSIS — Z0000 Encounter for general adult medical examination without abnormal findings: Secondary | ICD-10-CM | POA: Diagnosis not present

## 2021-03-11 DIAGNOSIS — F419 Anxiety disorder, unspecified: Secondary | ICD-10-CM

## 2021-03-11 LAB — URINALYSIS, ROUTINE W REFLEX MICROSCOPIC
Bilirubin, UA: NEGATIVE
Glucose, UA: NEGATIVE
Ketones, UA: NEGATIVE
Leukocytes,UA: NEGATIVE
Nitrite, UA: NEGATIVE
Protein,UA: NEGATIVE
RBC, UA: NEGATIVE
Specific Gravity, UA: 1.025 (ref 1.005–1.030)
Urobilinogen, Ur: 0.2 mg/dL (ref 0.2–1.0)
pH, UA: 6 (ref 5.0–7.5)

## 2021-03-11 NOTE — Progress Notes (Signed)
1 day early for his physical. Will draw labs today and have him return for physical next week.

## 2021-03-11 NOTE — Telephone Encounter (Signed)
Will either of you please call patient to schedule a follow up OV with Dr. Merlene Pulling please. Thank you!

## 2021-03-12 ENCOUNTER — Ambulatory Visit: Payer: BC Managed Care – PPO | Admitting: Family Medicine

## 2021-03-13 ENCOUNTER — Other Ambulatory Visit: Payer: Self-pay | Admitting: Family Medicine

## 2021-03-13 DIAGNOSIS — R7989 Other specified abnormal findings of blood chemistry: Secondary | ICD-10-CM | POA: Insufficient documentation

## 2021-03-13 LAB — COMPREHENSIVE METABOLIC PANEL
ALT: 30 IU/L (ref 0–44)
AST: 18 IU/L (ref 0–40)
Albumin/Globulin Ratio: 1.6 (ref 1.2–2.2)
Albumin: 4.5 g/dL (ref 4.0–5.0)
Alkaline Phosphatase: 72 IU/L (ref 44–121)
BUN/Creatinine Ratio: 9 (ref 9–20)
BUN: 11 mg/dL (ref 6–24)
Bilirubin Total: 0.5 mg/dL (ref 0.0–1.2)
CO2: 17 mmol/L — ABNORMAL LOW (ref 20–29)
Calcium: 8.8 mg/dL (ref 8.7–10.2)
Chloride: 108 mmol/L — ABNORMAL HIGH (ref 96–106)
Creatinine, Ser: 1.28 mg/dL — ABNORMAL HIGH (ref 0.76–1.27)
Globulin, Total: 2.8 g/dL (ref 1.5–4.5)
Glucose: 90 mg/dL (ref 65–99)
Potassium: 3.7 mmol/L (ref 3.5–5.2)
Sodium: 141 mmol/L (ref 134–144)
Total Protein: 7.3 g/dL (ref 6.0–8.5)
eGFR: 73 mL/min/{1.73_m2} (ref >59)

## 2021-03-13 LAB — CBC WITH DIFFERENTIAL/PLATELET
Basophils Absolute: 0 10*3/uL (ref 0.0–0.2)
Basos: 1 %
EOS (ABSOLUTE): 0.1 10*3/uL (ref 0.0–0.4)
Eos: 2 %
Hematocrit: 44.4 % (ref 37.5–51.0)
Hemoglobin: 14.7 g/dL (ref 13.0–17.7)
Immature Grans (Abs): 0 10*3/uL (ref 0.0–0.1)
Immature Granulocytes: 1 %
Lymphocytes Absolute: 2.4 10*3/uL (ref 0.7–3.1)
Lymphs: 30 %
MCH: 29.1 pg (ref 26.6–33.0)
MCHC: 33.1 g/dL (ref 31.5–35.7)
MCV: 88 fL (ref 79–97)
Monocytes Absolute: 0.5 10*3/uL (ref 0.1–0.9)
Monocytes: 6 %
Neutrophils Absolute: 4.9 10*3/uL (ref 1.4–7.0)
Neutrophils: 60 %
Platelets: 252 10*3/uL (ref 150–450)
RBC: 5.06 x10E6/uL (ref 4.14–5.80)
RDW: 12.7 % (ref 11.6–15.4)
WBC: 7.9 10*3/uL (ref 3.4–10.8)

## 2021-03-13 LAB — LIPID PANEL W/O CHOL/HDL RATIO
Cholesterol, Total: 165 mg/dL (ref 100–199)
HDL: 38 mg/dL — ABNORMAL LOW (ref >39)
LDL Chol Calc (NIH): 104 mg/dL — ABNORMAL HIGH (ref 0–99)
Triglycerides: 127 mg/dL (ref 0–149)
VLDL Cholesterol Cal: 23 mg/dL (ref 5–40)

## 2021-03-13 LAB — TESTOSTERONE, FREE, TOTAL, SHBG
Sex Hormone Binding: 24.4 nmol/L (ref 16.5–55.9)
Testosterone, Free: 7.9 pg/mL (ref 6.8–21.5)
Testosterone: 207 ng/dL — ABNORMAL LOW (ref 264–916)

## 2021-03-13 LAB — TSH: TSH: 1.64 u[IU]/mL (ref 0.450–4.500)

## 2021-03-13 LAB — HEPATITIS C ANTIBODY: Hep C Virus Ab: <0.1 s/co ratio (ref 0.0–0.9)

## 2021-03-13 LAB — HIV ANTIBODY (ROUTINE TESTING W REFLEX): HIV Screen 4th Generation wRfx: NONREACTIVE

## 2021-03-13 MED ORDER — TESTOSTERONE 50 MG/5GM (1%) TD GEL: 5.0000 g | Freq: Every day | TRANSDERMAL | 2 refills | Status: DC

## 2021-03-15 ENCOUNTER — Ambulatory Visit (INDEPENDENT_AMBULATORY_CARE_PROVIDER_SITE_OTHER): Payer: BC Managed Care – PPO | Admitting: Family Medicine

## 2021-03-15 ENCOUNTER — Encounter: Payer: Self-pay | Admitting: Family Medicine

## 2021-03-15 ENCOUNTER — Other Ambulatory Visit: Payer: Self-pay

## 2021-03-15 VITALS — BP 128/84 | HR 80 | Temp 98.1°F | Ht 71.0 in | Wt 366.0 lb

## 2021-03-15 DIAGNOSIS — D6851 Activated protein C resistance: Secondary | ICD-10-CM | POA: Diagnosis not present

## 2021-03-15 DIAGNOSIS — F419 Anxiety disorder, unspecified: Secondary | ICD-10-CM | POA: Diagnosis not present

## 2021-03-15 DIAGNOSIS — Z0000 Encounter for general adult medical examination without abnormal findings: Secondary | ICD-10-CM

## 2021-03-15 DIAGNOSIS — I2699 Other pulmonary embolism without acute cor pulmonale: Secondary | ICD-10-CM | POA: Diagnosis not present

## 2021-03-15 DIAGNOSIS — R7989 Other specified abnormal findings of blood chemistry: Secondary | ICD-10-CM

## 2021-03-15 MED ORDER — OMEPRAZOLE 20 MG PO CPDR: 20.0000 mg | DELAYED_RELEASE_CAPSULE | Freq: Every day | ORAL | 1 refills | Status: DC

## 2021-03-15 MED ORDER — SERTRALINE HCL 100 MG PO TABS: ORAL_TABLET | ORAL | 1 refills | Status: DC

## 2021-03-15 NOTE — Progress Notes (Signed)
BP 128/84   Pulse 80   Temp 98.1 F (36.7 C)   Ht _0  (1.803 m)   Wt (!) 366 lb (166 kg)   SpO2 99%   BMI 51.05 kg/m    Subjective:    Patient ID: Shane Oneill, male    DOB: Apr 18, 1981, 40 y.o.   MRN: [id number]  HPI: Shane Oneill is a 39 y.o. male presenting on 03/15/2021 for comprehensive medical examination. Current medical complaints include:  LOW TESTOSTERONE- newly diagnosed Duration: chronic Status: uncontrolled  Satisfied with current treatment:  Hasn't started anything yet Previous testosterone therapies: none Decreased libido: no Fatigue: yes Depressed mood: yes Muscle weakness: no Erectile dysfunction: no  ANXIETY/STRESS Duration: chronic Status:controlled Anxious mood: no  Excessive worrying: no Irritability: no  Sweating: no Nausea: no Palpitations:no Hyperventilation: no Panic attacks: no Agoraphobia: no  Obscessions/compulsions: no Depressed mood: no Depression screen [facility] 2/9 03/15/2021 03/12/2020 09/06/2019 10/03/2018 08/15/2018  Decreased Interest 0 0 0 0 0  Down, Depressed, Hopeless 0 0 0 0 0  PHQ - 2 Score 0 0 0 0 0  Altered sleeping - 1 0 0 0  Tired, decreased energy - 0 0 0 0  Change in appetite - 0 0 0 0  Feeling bad or failure about yourself  - 0 0 0 0  Trouble concentrating - 0 0 0 0  Moving slowly or fidgety/restless - 0 0 0 0  Suicidal thoughts - 0 0 0 0  PHQ-9 Score - 1 0 0 0  Difficult doing work/chores - - - Not difficult at all Not difficult at all   Anhedonia: no Weight changes: no Insomnia: no   Hypersomnia: no Fatigue/loss of energy: yes Feelings of worthlessness: no Feelings of guilt: no Impaired concentration/indecisiveness: no Suicidal ideations: no  Crying spells: no  He currently lives with: SO and Son Interim Problems from his last visit: no  Depression Screen done today and results listed below:  Depression screen [facility] 2/9 03/15/2021 03/12/2020 09/06/2019 10/03/2018 08/15/2018  Decreased Interest 0 0 0 0 0  Down,  Depressed, Hopeless 0 0 0 0 0  PHQ - 2 Score 0 0 0 0 0  Altered sleeping - 1 0 0 0  Tired, decreased energy - 0 0 0 0  Change in appetite - 0 0 0 0  Feeling bad or failure about yourself  - 0 0 0 0  Trouble concentrating - 0 0 0 0  Moving slowly or fidgety/restless - 0 0 0 0  Suicidal thoughts - 0 0 0 0  PHQ-9 Score - 1 0 0 0  Difficult doing work/chores - - - Not difficult at all Not difficult at all    Past Medical History:  Past Medical History:  Diagnosis Date  . Anxiety   . Blood clot in vein   . Idiopathic intracranial hypertension   . Sleep apnea     Surgical History:  Past Surgical History:  Procedure Laterality Date  . club feet      Medications:  Current Outpatient Medications on File Prior to Visit  Medication Sig  . acetaZOLAMIDE (DIAMOX) 250 MG tablet Take 1,500 mg by mouth 2 (two) times daily.  Marland Kitchen ALPRAZolam (XANAX) 0.5 MG tablet alprazolam 0.5 mg tablet daily PRN  . apixaban (ELIQUIS) 5 MG TABS tablet Take 1 tablet (5 mg total) by mouth 2 (two) times daily.  Marland Kitchen testosterone (ANDROGEL) 50 MG/5GM (1%) GEL Place 5 g onto the skin daily. (Patient not taking: Reported on 03/15/2021)  No current facility-administered medications on file prior to visit.    Allergies:  Allergies  Allergen Reactions  . Cefoxitin Hives    Social History:  Social History   Socioeconomic History  . Marital status: Single    Spouse name: Not on file  . Number of children: Not on file  . Years of education: Not on file  . Highest education level: Not on file  Occupational History  . Not on file  Tobacco Use  . Smoking status: Former Smoker    Packs/day: 0.50    Years: 10.00    Pack years: 5.00    Types: Cigarettes    Quit date: 11/28/2015    Years since quitting: 5.2  . Smokeless tobacco: Never Used  Vaping Use  . Vaping Use: Never used  Substance and Sexual Activity  . Alcohol use: Not Currently  . Drug use: Never  . Sexual activity: Yes  Other Topics Concern  .  Not on file  Social History Narrative  . Not on file   Social Determinants of Health   Financial Resource Strain: Not on file  Food Insecurity: Not on file  Transportation Needs: Not on file  Physical Activity: Not on file  Stress: Not on file  Social Connections: Not on file  Intimate Partner Violence: Not on file   Social History   Tobacco Use  Smoking Status Former Smoker  . Packs/day: 0.50  . Years: 10.00  . Pack years: 5.00  . Types: Cigarettes  . Quit date: 11/28/2015  . Years since quitting: 5.2  Smokeless Tobacco Never Used   Social History   Substance and Sexual Activity  Alcohol Use Not Currently    Family History:  Family History  Problem Relation Age of Onset  . Hypertension Mother   . Healthy Father   . Cancer Maternal Aunt     Past medical history, surgical history, medications, allergies, family history and social history reviewed with patient today and changes made to appropriate areas of the chart.   Review of Systems  Constitutional: Negative.   HENT: Negative.   Eyes: Positive for blurred vision. Negative for double vision, photophobia, pain, discharge and redness.  Respiratory: Negative.   Cardiovascular: Negative.   Gastrointestinal: Negative.   Genitourinary: Negative.   Musculoskeletal: Negative.   Skin: Negative.   Endo/Heme/Allergies: Positive for environmental allergies. Negative for polydipsia. Does not bruise/bleed easily.   All other ROS negative except what is listed above and in the HPI.      Objective:    BP 128/84   Pulse 80   Temp 98.1 F (36.7 C)   Ht _0  (1.803 m)   Wt (!) 366 lb (166 kg)   SpO2 99%   BMI 51.05 kg/m   Wt Readings from Last 3 Encounters:  03/15/21 (!) 366 lb (166 kg)  09/11/20 (!) 353 lb 12.8 oz (160.5 kg)  06/09/20 (!) 353 lb 9.6 oz (160.4 kg)    Physical Exam  Results for orders placed or performed in visit on 03/11/21  Comprehensive metabolic panel  Result Value Ref Range   Glucose  90 65 - 99 mg/dL   BUN 11 6 - 24 mg/dL   Creatinine, Ser 1.28 (H) 0.76 - 1.27 mg/dL   eGFR 73 >59 mL/min/1.73   BUN/Creatinine Ratio 9 9 - 20   Sodium 141 134 - 144 mmol/L   Potassium 3.7 3.5 - 5.2 mmol/L   Chloride 108 (H) 96 - 106 mmol/L   CO2 17 (  L) 20 - 29 mmol/L   Calcium 8.8 8.7 - 10.2 mg/dL   Total Protein 7.3 6.0 - 8.5 g/dL   Albumin 4.5 4.0 - 5.0 g/dL   Globulin, Total 2.8 1.5 - 4.5 g/dL   Albumin/Globulin Ratio 1.6 1.2 - 2.2   Bilirubin Total 0.5 0.0 - 1.2 mg/dL   Alkaline Phosphatase 72 44 - 121 IU/L   AST 18 0 - 40 IU/L   ALT 30 0 - 44 IU/L  CBC with Differential/Platelet  Result Value Ref Range   WBC 7.9 3.4 - 10.8 x10E3/uL   RBC 5.06 4.14 - 5.80 x10E6/uL   Hemoglobin 14.7 13.0 - 17.7 g/dL   Hematocrit 44.4 37.5 - 51.0 %   MCV 88 79 - 97 fL   MCH 29.1 26.6 - 33.0 pg   MCHC 33.1 31.5 - 35.7 g/dL   RDW 12.7 11.6 - 15.4 %   Platelets 252 150 - 450 x10E3/uL   Neutrophils 60 Not Estab. %   Lymphs 30 Not Estab. %   Monocytes 6 Not Estab. %   Eos 2 Not Estab. %   Basos 1 Not Estab. %   Neutrophils Absolute 4.9 1.4 - 7.0 x10E3/uL   Lymphocytes Absolute 2.4 0.7 - 3.1 x10E3/uL   Monocytes Absolute 0.5 0.1 - 0.9 x10E3/uL   EOS (ABSOLUTE) 0.1 0.0 - 0.4 x10E3/uL   Basophils Absolute 0.0 0.0 - 0.2 x10E3/uL   Immature Granulocytes 1 Not Estab. %   Immature Grans (Abs) 0.0 0.0 - 0.1 x10E3/uL  Lipid Panel w/o Chol/HDL Ratio  Result Value Ref Range   Cholesterol, Total 165 100 - 199 mg/dL   Triglycerides 127 0 - 149 mg/dL   HDL 38 (L) >39 mg/dL   VLDL Cholesterol Cal 23 5 - 40 mg/dL   LDL Chol Calc (NIH) 104 (H) 0 - 99 mg/dL  TSH  Result Value Ref Range   TSH 1.640 0.450 - 4.500 uIU/mL  Urinalysis, Routine w reflex microscopic  Result Value Ref Range   Specific Gravity, UA 1.025 1.005 - 1.030   pH, UA 6.0 5.0 - 7.5   Color, UA Yellow Yellow   Appearance Ur Clear Clear   Leukocytes,UA Negative Negative   Protein,UA Negative Negative/Trace   Glucose, UA Negative  Negative   Ketones, UA Negative Negative   RBC, UA Negative Negative   Bilirubin, UA Negative Negative   Urobilinogen, Ur 0.2 0.2 - 1.0 mg/dL   Nitrite, UA Negative Negative  Hepatitis C Antibody  Result Value Ref Range   Hep C Virus Ab <0.1 0.0 - 0.9 s/co ratio  HIV Antibody (routine testing w rflx)  Result Value Ref Range   HIV Screen 4th Generation wRfx Non Reactive Non Reactive  Testosterone, Free, Total, SHBG  Result Value Ref Range   Testosterone 207 (L) 264 - 916 ng/dL   Testosterone, Free 7.9 6.8 - 21.5 pg/mL   Sex Hormone Binding 24.4 16.5 - 55.9 nmol/L      Assessment & Plan:   Problem List Items Addressed This Visit      Cardiovascular and Mediastinum   Pulmonary emboli ([place])    Follows with hematology. Has been stable. Labs drawn last week were normal. Continue current regimen. Continue to monitor. Newly diagnosed with low testosterone. Will start him on low dose androgel and watch his CBC very closely. Call with any concerns.         Hematopoietic and Hemostatic   Factor V Leiden mutation [facility])    Follows with hematology.  Has been stable. Labs drawn last week were normal. Continue current regimen. Continue to monitor. Newly diagnosed with low testosterone. Will start him on low dose androgel and watch his CBC very closely. Call with any concerns.       Relevant Orders   CBC with Differential/Platelet     Other   Anxiety    Under good control on current regimen. Continue current regimen. Continue to monitor. Call with any concerns. Refills given on his sertraline, but does not need a refill on his xanax at this time. Will call if he needs it.        Relevant Medications   sertraline (ZOLOFT) 100 MG tablet   Low testosterone    Newly diagnosed with low testosterone. Will start him on low dose androgel and watch his CBC very closely. Call with any concerns.       Relevant Orders   CBC with Differential/Platelet   Testosterone, free, total([facility])     Other Visit Diagnoses    Routine general medical examination at a health care facility    -  Primary   Vaccines up to date. Screening labs checked last week. Continue diet and exercise. Call with any concerns. Continue to monitor.       Discussed aspirin prophylaxis for myocardial infarction prevention and decision was it was not indicated  LABORATORY TESTING:  Health maintenance labs ordered today as discussed above.   IMMUNIZATIONS:   - Tdap: Tetanus vaccination status reviewed: last tetanus booster within 10 years. - Influenza: Up to date - Pneumovax: Not applicable - Prevnar: Not applicable - COVID: Refused  PATIENT COUNSELING:    Sexuality: Discussed sexually transmitted diseases, partner selection, use of condoms, avoidance of unintended pregnancy  and contraceptive alternatives.   Advised to avoid cigarette smoking.  I discussed with the patient that most people either abstain from alcohol or drink within safe limits (<=14/week and <=4 drinks/occasion for males, <=7/weeks and <= 3 drinks/occasion for females) and that the risk for alcohol disorders and other health effects rises proportionally with the number of drinks per week and how often a drinker exceeds daily limits.  Discussed cessation/primary prevention of drug use and availability of treatment for abuse.   Diet: Encouraged to adjust caloric intake to maintain  or achieve ideal body weight, to reduce intake of dietary saturated fat and total fat, to limit sodium intake by avoiding high sodium foods and not adding table salt, and to maintain adequate dietary potassium and calcium preferably from fresh fruits, vegetables, and low-fat dairy products.    stressed the importance of regular exercise  Injury prevention: Discussed safety belts, safety helmets, smoke detector, smoking near bedding or upholstery.   Dental health: Discussed importance of regular tooth brushing, flossing, and dental visits.   Follow up  plan: NEXT PREVENTATIVE PHYSICAL DUE IN 1 YEAR. Return in about 6 months (around 09/14/2021).

## 2021-03-15 NOTE — Assessment & Plan Note (Signed)
Newly diagnosed with low testosterone. Will start him on low dose androgel and watch his CBC very closely. Call with any concerns.

## 2021-03-15 NOTE — Assessment & Plan Note (Signed)
Follows with hematology. Has been stable. Labs drawn last week were normal. Continue current regimen. Continue to monitor. Newly diagnosed with low testosterone. Will start him on low dose androgel and watch his CBC very closely. Call with any concerns.

## 2021-03-15 NOTE — Assessment & Plan Note (Signed)
Follows with hematology. Has been stable. Labs drawn last week were normal. Continue current regimen. Continue to monitor. Newly diagnosed with low testosterone. Will start him on low dose androgel and watch his CBC very closely. Call with any concerns.  

## 2021-03-15 NOTE — Patient Instructions (Signed)

## 2021-03-15 NOTE — Assessment & Plan Note (Addendum)
Under good control on current regimen. Continue current regimen. Continue to monitor. Call with any concerns. Refills given on his sertraline, but does not need a refill on his xanax at this time. Will call if he needs it.

## 2021-03-22 ENCOUNTER — Encounter: Payer: Self-pay | Admitting: Family Medicine

## 2021-03-23 ENCOUNTER — Inpatient Hospital Stay: Payer: BC Managed Care – PPO | Attending: Oncology | Admitting: Oncology

## 2021-03-23 ENCOUNTER — Other Ambulatory Visit: Payer: Self-pay

## 2021-03-23 ENCOUNTER — Telehealth: Payer: Self-pay

## 2021-03-23 ENCOUNTER — Encounter: Payer: Self-pay | Admitting: Oncology

## 2021-03-23 VITALS — BP 133/76 | HR 75 | Temp 97.7°F | Resp 20 | Wt 362.2 lb

## 2021-03-23 DIAGNOSIS — Z86711 Personal history of pulmonary embolism: Secondary | ICD-10-CM | POA: Insufficient documentation

## 2021-03-23 DIAGNOSIS — G932 Benign intracranial hypertension: Secondary | ICD-10-CM | POA: Diagnosis not present

## 2021-03-23 DIAGNOSIS — Z7901 Long term (current) use of anticoagulants: Secondary | ICD-10-CM | POA: Diagnosis not present

## 2021-03-23 DIAGNOSIS — E291 Testicular hypofunction: Secondary | ICD-10-CM | POA: Diagnosis not present

## 2021-03-23 DIAGNOSIS — Z86718 Personal history of other venous thrombosis and embolism: Secondary | ICD-10-CM | POA: Diagnosis not present

## 2021-03-23 DIAGNOSIS — Z87891 Personal history of nicotine dependence: Secondary | ICD-10-CM | POA: Insufficient documentation

## 2021-03-23 DIAGNOSIS — D6851 Activated protein C resistance: Secondary | ICD-10-CM | POA: Insufficient documentation

## 2021-03-23 DIAGNOSIS — Z809 Family history of malignant neoplasm, unspecified: Secondary | ICD-10-CM | POA: Diagnosis not present

## 2021-03-23 DIAGNOSIS — I2699 Other pulmonary embolism without acute cor pulmonale: Secondary | ICD-10-CM | POA: Diagnosis not present

## 2021-03-23 NOTE — Progress Notes (Signed)
[facility]  [address] [place], [place] [address] Phone: [phone]  Fax: [phone]   Telemedicine Office Visit:  03/23/2021  Referring physician: Valerie Roys, DO  Chief Complaint: Shane Oneill is a 39 y.o. male with pulmonary embolism and heterozygosity for Factor V Leiden who is seen for routine follow-up.    HPI: The patient was last seen in the hematology clinic on 12/18/2019.Marland Kitchen At that time, he has felt the same.  He feels no significant changes have happened.   He denies any chest pain, hemoptysis, or lower extremity edema.  He hasn't had any chest wall pain since his last visit.  He notes that he sits for most of the day in front of a computer.  He was seen for follow up of his idiopathic intracranial hypertension (IIH) with Dr. Kathrynn Humble on 08/22/2019. He was to continue his Diamox 1500 mg BID. He has a follow up in 1 year.   He was seen by PCP Dr. Wynetta Emery on 03/15/2021 and started on testosterone gel for low testosterone levels.  During the interim, he has done well.  Denies any new blood clots.  Has been taking his Eliquis inconsistently at times.  Admits to possibly missing a dose here and there.   Past Medical History:  Diagnosis Date  . Anxiety   . Blood clot in vein   . Idiopathic intracranial hypertension   . Sleep apnea     Past Surgical History:  Procedure Laterality Date  . club feet      Family History  Problem Relation Age of Onset  . Hypertension Mother   . Healthy Father   . Cancer Maternal Aunt     Social History:  reports that he quit smoking about 5 years ago. His smoking use included cigarettes. He has a 5.00 pack-year smoking history. He has never used smokeless tobacco. He reports previous alcohol use. He reports that he does not use drugs. He previously smoked 1/2 pack/day x 10 years.  He rarely uses alcohol. Patient denies known exposures to radiation on toxins.  He works in an office. The patient is  alone  today.  Allergies:  Allergies  Allergen Reactions  . Cefoxitin Hives    Current Medications: Current Outpatient Medications  Medication Sig Dispense Refill  . acetaZOLAMIDE (DIAMOX) 250 MG tablet Take 1,500 mg by mouth 2 (two) times daily.    Marland Kitchen ALPRAZolam (XANAX) 0.5 MG tablet alprazolam 0.5 mg tablet daily PRN 30 tablet 0  . apixaban (ELIQUIS) 5 MG TABS tablet Take 1 tablet (5 mg total) by mouth 2 (two) times daily. 60 tablet 1  . omeprazole (PRILOSEC) 20 MG capsule Take 1 capsule (20 mg total) by mouth daily. 90 capsule 1  . sertraline (ZOLOFT) 100 MG tablet TAKE 2 TABLETS(200 MG) BY MOUTH DAILY 180 tablet 1  . testosterone (ANDROGEL) 50 MG/5GM (1%) GEL Place 5 g onto the skin daily. 150 g 2   No current facility-administered medications for this visit.    Review of Systems  Constitutional: Negative.  Negative for chills, diaphoresis, fever and malaise/fatigue.       Feels "ok".  HENT: Negative.  Negative for congestion, ear discharge, ear pain, nosebleeds, sinus pain, sore throat and tinnitus.   Eyes: Negative.  Negative for blurred vision, double vision, photophobia, pain, discharge and redness.  Respiratory: Negative.  Negative for cough, hemoptysis, sputum production, shortness of breath and wheezing.        Sleep apnea on CPAP.  Cardiovascular: Negative.  Negative for chest pain, palpitations, orthopnea, leg swelling and PND.  Gastrointestinal: Negative.  Negative for abdominal pain, blood in stool, constipation, diarrhea, heartburn, nausea and vomiting.  Genitourinary: Negative.  Negative for dysuria, frequency, hematuria and urgency.  Musculoskeletal: Negative.  Negative for back pain, falls, joint pain, myalgias and neck pain.       Left sided chest wall pain, resolved.  Skin: Negative.  Negative for itching and rash.  Neurological: Negative.  Negative for dizziness, tingling, tremors, sensory change, speech change, focal weakness, weakness and headaches.   Endo/Heme/Allergies: Negative.  Does not bruise/bleed easily.  Psychiatric/Behavioral: Negative.  Negative for depression, hallucinations, memory loss and substance abuse. The patient is not nervous/anxious and does not have insomnia.   All other systems reviewed and are negative.  Performance status (ECOG): 0  Vitals Blood pressure 133/76, pulse 75, temperature 97.7 F (36.5 C), resp. rate 20, weight (!) 362 lb 3.5 oz (164.3 kg), SpO2 98 %.   Physical Exam Nursing note reviewed.  Constitutional:      General: He is not in acute distress.    Appearance: He is well-developed. He is not diaphoretic.  HENT:     Head: Atraumatic.  Eyes:     General: No scleral icterus.    Conjunctiva/sclera: Conjunctivae normal.     Comments: Blue eyes.  Neurological:     Mental Status: He is alert and oriented to person, place, and time.  Psychiatric:        Behavior: Behavior normal.        Thought Content: Thought content normal.        Judgment: Judgment normal.     No visits with results within 3 Day(s) from this visit.  Latest known visit with results is:  Office Visit on 03/11/2021  Component Date Value Ref Range Status  . Glucose 03/11/2021 90  65 - 99 mg/dL Final  . BUN 03/11/2021 11  6 - 24 mg/dL Final  . Creatinine, Ser 03/11/2021 1.28* 0.76 - 1.27 mg/dL Final  . eGFR 03/11/2021 73  >59 mL/min/1.73 Final  . BUN/Creatinine Ratio 03/11/2021 9  9 - 20 Final  . Sodium 03/11/2021 141  134 - 144 mmol/L Final  . Potassium 03/11/2021 3.7  3.5 - 5.2 mmol/L Final  . Chloride 03/11/2021 108* 96 - 106 mmol/L Final  . CO2 03/11/2021 17* 20 - 29 mmol/L Final  . Calcium 03/11/2021 8.8  8.7 - 10.2 mg/dL Final  . Total Protein 03/11/2021 7.3  6.0 - 8.5 g/dL Final  . Albumin 03/11/2021 4.5  4.0 - 5.0 g/dL Final  . Globulin, Total 03/11/2021 2.8  1.5 - 4.5 g/dL Final  . Albumin/Globulin Ratio 03/11/2021 1.6  1.2 - 2.2 Final  . Bilirubin Total 03/11/2021 0.5  0.0 - 1.2 mg/dL Final  . Alkaline  Phosphatase 03/11/2021 72  44 - 121 IU/L Final  . AST 03/11/2021 18  0 - 40 IU/L Final  . ALT 03/11/2021 30  0 - 44 IU/L Final  . WBC 03/11/2021 7.9  3.4 - 10.8 x10E3/uL Final  . RBC 03/11/2021 5.06  4.14 - 5.80 x10E6/uL Final  . Hemoglobin 03/11/2021 14.7  13.0 - 17.7 g/dL Final  . Hematocrit 03/11/2021 44.4  37.5 - 51.0 % Final  . MCV 03/11/2021 88  79 - 97 fL Final  . MCH 03/11/2021 29.1  26.6 - 33.0 pg Final  . MCHC 03/11/2021 33.1  31.5 - 35.7 g/dL Final  . RDW 03/11/2021 12.7  11.6 - 15.4 %  Final  . Platelets 03/11/2021 252  150 - 450 x10E3/uL Final  . Neutrophils 03/11/2021 60  Not Estab. % Final  . Lymphs 03/11/2021 30  Not Estab. % Final  . Monocytes 03/11/2021 6  Not Estab. % Final  . Eos 03/11/2021 2  Not Estab. % Final  . Basos 03/11/2021 1  Not Estab. % Final  . Neutrophils Absolute 03/11/2021 4.9  1.4 - 7.0 x10E3/uL Final  . Lymphocytes Absolute 03/11/2021 2.4  0.7 - 3.1 x10E3/uL Final  . Monocytes Absolute 03/11/2021 0.5  0.1 - 0.9 x10E3/uL Final  . EOS (ABSOLUTE) 03/11/2021 0.1  0.0 - 0.4 x10E3/uL Final  . Basophils Absolute 03/11/2021 0.0  0.0 - 0.2 x10E3/uL Final  . Immature Granulocytes 03/11/2021 1  Not Estab. % Final  . Immature Grans (Abs) 03/11/2021 0.0  0.0 - 0.1 x10E3/uL Final  . Cholesterol, Total 03/11/2021 165  100 - 199 mg/dL Final  . Triglycerides 03/11/2021 127  0 - 149 mg/dL Final  . HDL 03/11/2021 38* >39 mg/dL Final  . VLDL Cholesterol Cal 03/11/2021 23  5 - 40 mg/dL Final  . LDL Chol Calc (NIH) 03/11/2021 104* 0 - 99 mg/dL Final  . TSH 03/11/2021 1.640  0.450 - 4.500 uIU/mL Final  . Specific Gravity, UA 03/11/2021 1.025  1.005 - 1.030 Final  . pH, UA 03/11/2021 6.0  5.0 - 7.5 Final  . Color, UA 03/11/2021 Yellow  Yellow Final  . Appearance Ur 03/11/2021 Clear  Clear Final  . Leukocytes,UA 03/11/2021 Negative  Negative Final  . Protein,UA 03/11/2021 Negative  Negative/Trace Final  . Glucose, UA 03/11/2021 Negative  Negative Final  . Ketones, UA  03/11/2021 Negative  Negative Final  . RBC, UA 03/11/2021 Negative  Negative Final  . Bilirubin, UA 03/11/2021 Negative  Negative Final  . Urobilinogen, Ur 03/11/2021 0.2  0.2 - 1.0 mg/dL Final  . Nitrite, UA 03/11/2021 Negative  Negative Final  . Hep C Virus Ab 03/11/2021 <0.1  0.0 - 0.9 s/co ratio Final   Comment:                                   Negative:     < 0.8                              Indeterminate: 0.8 - 0.9                                   Positive:     > 0.9  The CDC recommends that a positive HCV antibody result  be followed up with a HCV Nucleic Acid Amplification  test (505697).   Marland Kitchen HIV Screen 4th Generation wRfx 03/11/2021 Non Reactive  Non Reactive Final   Comment: HIV Negative HIV-1/HIV-2 antibodies and HIV-1 p24 antigen were NOT detected. There is no laboratory evidence of HIV infection.   . Testosterone 03/11/2021 207* 264 - 916 ng/dL Final   Comment: Adult male reference interval is based on a population of healthy nonobese males (BMI <30) between 45 and 54 years old. [place], [place] [phone]. PMID: 78675449.   Marland Kitchen Testosterone, Free 03/11/2021 7.9  6.8 - 21.5 pg/mL Final  . Sex Hormone Binding 03/11/2021 24.4  16.5 - 55.9 nmol/L Final    Assessment:  Shane Oneill is a 40 y.o.  male with a history of right lower extremity superficial thrombosis x 3 and a history of bilateral pulmonary emboli.  He denies any precipating events.  He sits at a desk (sedentary).  He denies any family history of thrombosis.  He is on Eliquis.  Chest CT angiogram on 04/20/2018 revealed multiple acute segmental pulmonary emboli in the lungs bilaterally.  There were small left upper lobe lingula and dependent left lower lobe pulmonary infarctions.  There was a small left pleural effusion.  Bilateral lower extremity duplex on 05/04/2018 revealed no evidence of DVT within either lower extremity.  Hypercoagulable work-up on 04/30/2018 revealed Factor V Leiden (single  [id number] mutation).  Normal studies included:  prothrombin gene mutation, lupus anticoagulant panel, anticardiolipin antibodies, beta-2 glycoprotein antibodies.  Normal studies on 08/09/2018 included protein C antigen (97%) and activity (129%), protein S antigen (74%) and activity (68%) and ATIII antigen (92%) and activity (110%).  Symptomatically, he is doing well.  He denies any shortness of breath or lower extremity edema.  He denies any bruising or bleeding.  Plan: 1.   Review labs. 2.   Bilateral pulmonary emboli             -Stable on Eliquis  -Denies any bleeding episodes.  -Per Dr. Kem Parkinson previous note, patient will likely be on anticoagulation indefinitely.  - Recurrence risk after discontinuation of anticoagulation after first of unprovoked VTE is 10% at first year and 30% at 5 years (5 %/year after the first year).  -Patient is agreeable to continue Eliquis. 3.   Low testosterone levels:  -Recently placed on testosterone gel.  -Transdermal testosterone gel does not appear to be contraindicated for the development of blood clots per up-to-date.  Plan: -RTC in 6 months for MD assessment and labs (CBC with diff, CMP).  I discussed the assessment and treatment plan with the patient.  The patient was provided an opportunity to ask questions and all were answered.  The patient agreed with the plan and demonstrated an understanding of the instructions.  The patient was advised to call back if the symptoms worsen or if the condition fails to improve as anticipated.  Greater than 50% was spent in counseling and coordination of care with this patient including but not limited to discussion of the relevant topics above (See A&P) including, but not limited to diagnosis and management of acute and chronic medical conditions.   Faythe Casa, NP 03/23/2021 1:12 PM

## 2021-03-23 NOTE — Telephone Encounter (Signed)
PA submitted via cover my meds for androgel. Awaiting approval or denial.  Key: [id number]

## 2021-04-05 ENCOUNTER — Telehealth: Payer: Self-pay

## 2021-04-05 NOTE — Telephone Encounter (Signed)
PA approved for testosterone gel. Key: [id number]. Patient notified.

## 2021-04-07 ENCOUNTER — Telehealth: Payer: Self-pay

## 2021-04-07 NOTE — Telephone Encounter (Signed)
Eliquis PA sent to [employer] via covermymeds.

## 2021-04-08 ENCOUNTER — Other Ambulatory Visit: Payer: Self-pay

## 2021-04-08 ENCOUNTER — Encounter: Payer: Self-pay | Admitting: Oncology

## 2021-04-08 MED ORDER — RIVAROXABAN 20 MG PO TABS: 20.0000 mg | ORAL_TABLET | Freq: Every day | ORAL | 0 refills | Status: DC

## 2021-04-13 ENCOUNTER — Telehealth: Payer: Self-pay

## 2021-04-13 NOTE — Telephone Encounter (Signed)
PA for Eliquis completed on Covermymeds.

## 2021-05-07 ENCOUNTER — Encounter: Payer: Self-pay | Admitting: Oncology

## 2021-05-07 ENCOUNTER — Other Ambulatory Visit: Payer: Self-pay | Admitting: *Deleted

## 2021-05-07 MED ORDER — RIVAROXABAN 20 MG PO TABS: 20.0000 mg | ORAL_TABLET | Freq: Every day | ORAL | 3 refills | Status: DC

## 2021-05-07 MED ORDER — ELIQUIS 5 MG PO TABS: 5.0000 mg | ORAL_TABLET | Freq: Two times a day (BID) | ORAL | 1 refills | Status: DC

## 2021-05-11 ENCOUNTER — Telehealth: Payer: Self-pay

## 2021-05-11 NOTE — Telephone Encounter (Signed)
Contacted [employer] to have appeal done, Victorino Dike at [employer] said appeal has been denied. Informed pt will still like Eliquis, he has a copay card which makes the medication affordable. She stated since it has been denied he can use the co-pay card in order to receive prescription since he does not want to switch over to Xarelto. Tried contacting pt to inform him of results pt did not answer. VM was named, left a detailed message let pt know if he had any questions or concerns please return our call.

## 2021-05-11 NOTE — Telephone Encounter (Signed)
Sent PA for Eliquis through [email or web address]

## 2021-05-14 ENCOUNTER — Telehealth: Payer: Self-pay

## 2021-05-14 NOTE — Telephone Encounter (Signed)
PT PA keeps getting denied to the fact that insurance no longer covers Eliquis, will like for pt to try Lesia Hausen or an explantation as to why he is not able to take it. Pt prefers to not switch over and will like to stay on Eliquis. Stated he has a co-pay card which makes the medication affordable.

## 2021-06-18 ENCOUNTER — Other Ambulatory Visit: Payer: Self-pay | Admitting: Family Medicine

## 2021-06-18 ENCOUNTER — Encounter: Payer: Self-pay | Admitting: Family Medicine

## 2021-06-18 NOTE — Telephone Encounter (Signed)
Requested medications are due for refill today yes  Requested medications are on the active medication list yes  Last refill 4/15  Last visit 4/18  Future visit scheduled no  Notes to clinic Not Delegated

## 2021-06-19 ENCOUNTER — Telehealth: Payer: Self-pay | Admitting: Nurse Practitioner

## 2021-06-19 MED ORDER — MOLNUPIRAVIR EUA 200MG CAPSULE: 4.0000 | ORAL_CAPSULE | Freq: Two times a day (BID) | ORAL | 0 refills | Status: AC

## 2021-06-19 NOTE — Telephone Encounter (Signed)
Call team nurse spoke with this provider on phone.  Patient had called reporting to call team that he started with symptoms of Covid yesterday, 06/18/21, after returning from travel and is positive on testing with some mild symptoms.  Does have some risk factors with obesity and Factor V Leiden -- non vaccinated.  Spoke to patient on telephone and is having fatigue, sinus symptoms.  Reports some ankle edema bilaterally after traveling.  Discussed option of oral Molnupiravir, currently shows no contraindications with his current problem list or medications.  He would like this sent in.  Answered all questions, including questions about IV monoclonal antibody.  Discussed with him due to short period of symptoms, would benefit from oral at this time.  Educated on side effects and how medications works.  Script sent and will reach out to PCP and office staff to schedule for virtual visit on Monday for follow-up.  If worsening symptoms over weekend immediately go to ER or urgent care.

## 2021-06-21 ENCOUNTER — Telehealth (INDEPENDENT_AMBULATORY_CARE_PROVIDER_SITE_OTHER): Payer: BC Managed Care – PPO | Admitting: Family Medicine

## 2021-06-21 ENCOUNTER — Encounter: Payer: Self-pay | Admitting: Family Medicine

## 2021-06-21 ENCOUNTER — Other Ambulatory Visit: Payer: Self-pay

## 2021-06-21 DIAGNOSIS — U071 COVID-19: Secondary | ICD-10-CM | POA: Diagnosis not present

## 2021-06-21 MED ORDER — ALPRAZOLAM 0.5 MG PO TABS: ORAL_TABLET | ORAL | 0 refills | Status: DC

## 2021-06-21 MED ORDER — HYDROCOD POLST-CPM POLST ER 10-8 MG/5ML PO SUER: 5.0000 mL | Freq: Two times a day (BID) | ORAL | 0 refills | Status: DC | PRN

## 2021-06-21 MED ORDER — PREDNISONE 50 MG PO TABS: 50.0000 mg | ORAL_TABLET | Freq: Every day | ORAL | 0 refills | Status: DC

## 2021-06-21 MED ORDER — BENZONATATE 200 MG PO CAPS: 200.0000 mg | ORAL_CAPSULE | Freq: Two times a day (BID) | ORAL | 0 refills | Status: DC | PRN

## 2021-06-21 NOTE — Progress Notes (Signed)
There were no vitals taken for this visit.   Subjective:    Patient ID: Shane Oneill, male    DOB: 06-29-1981, 40 y.o.   MRN: [id number]  HPI: Shane Oneill is a 40 y.o. male  Chief Complaint  Patient presents with   Covid Positive    Pt states he tested positive for covid on Saturday. States he is having a little bit of a sore throat, fatigued, cough, and headache.    UPPER RESPIRATORY TRACT INFECTION- Tested positive for COVID on Saturday Duration: 2-3 days Worst symptom: Fever: no + chills and sweats Cough: yes Shortness of breath: no Wheezing: no Chest pain: yes, with cough Chest tightness: no Chest congestion: no Nasal congestion: yes Runny nose: yes Post nasal drip: yes Sneezing: no Sore throat: no Swollen glands: no Sinus pressure: yes Headache: no Face pain: no Toothache: no Ear pain: no  Ear pressure: no  Eyes red/itching:no Eye drainage/crusting: no  Vomiting: no Rash: no Fatigue: yes Sick contacts: yes Strep contacts: no  Context: better Recurrent sinusitis: no Relief with OTC cold/cough medications: no  Treatments attempted: mulnopirovir    Relevant past medical, surgical, family and social history reviewed and updated as indicated. Interim medical history since our last visit reviewed. Allergies and medications reviewed and updated.  Review of Systems  Constitutional:  Positive for chills, diaphoresis and fatigue. Negative for activity change, appetite change, fever and unexpected weight change.  HENT:  Positive for congestion, postnasal drip, rhinorrhea and sinus pressure. Negative for dental problem, drooling, ear discharge, ear pain, facial swelling, hearing loss, mouth sores, nosebleeds, sinus pain, sneezing, sore throat, tinnitus, trouble swallowing and voice change.   Eyes:  Positive for visual disturbance. Negative for photophobia, pain, discharge, redness and itching.  Respiratory:  Positive for cough. Negative for apnea, choking, chest  tightness, shortness of breath, wheezing and stridor.   Cardiovascular:  Positive for palpitations. Negative for chest pain and leg swelling.  Gastrointestinal: Negative.   Musculoskeletal: Negative.   Neurological: Negative.   Psychiatric/Behavioral: Negative.     Per HPI unless specifically indicated above     Objective:    There were no vitals taken for this visit.  Wt Readings from Last 3 Encounters:  03/23/21 (!) 362 lb 3.5 oz (164.3 kg)  03/15/21 (!) 366 lb (166 kg)  09/11/20 (!) 353 lb 12.8 oz (160.5 kg)    Physical Exam Vitals and nursing note reviewed.  Pulmonary:     Effort: Pulmonary effort is normal. No respiratory distress.     Comments: Speaking in full sentences Neurological:     Mental Status: He is alert.  Psychiatric:        Mood and Affect: Mood normal.        Behavior: Behavior normal.        Thought Content: Thought content normal.        Judgment: Judgment normal.    Results for orders placed or performed in visit on 03/11/21  Comprehensive metabolic panel  Result Value Ref Range   Glucose 90 65 - 99 mg/dL   BUN 11 6 - 24 mg/dL   Creatinine, Ser 1.28 (H) 0.76 - 1.27 mg/dL   eGFR 73 >59 mL/min/1.73   BUN/Creatinine Ratio 9 9 - 20   Sodium 141 134 - 144 mmol/L   Potassium 3.7 3.5 - 5.2 mmol/L   Chloride 108 (H) 96 - 106 mmol/L   CO2 17 (L) 20 - 29 mmol/L   Calcium 8.8 8.7 - 10.2 mg/dL  Total Protein 7.3 6.0 - 8.5 g/dL   Albumin 4.5 4.0 - 5.0 g/dL   Globulin, Total 2.8 1.5 - 4.5 g/dL   Albumin/Globulin Ratio 1.6 1.2 - 2.2   Bilirubin Total 0.5 0.0 - 1.2 mg/dL   Alkaline Phosphatase 72 44 - 121 IU/L   AST 18 0 - 40 IU/L   ALT 30 0 - 44 IU/L  CBC with Differential/Platelet  Result Value Ref Range   WBC 7.9 3.4 - 10.8 x10E3/uL   RBC 5.06 4.14 - 5.80 x10E6/uL   Hemoglobin 14.7 13.0 - 17.7 g/dL   Hematocrit 44.4 37.5 - 51.0 %   MCV 88 79 - 97 fL   MCH 29.1 26.6 - 33.0 pg   MCHC 33.1 31.5 - 35.7 g/dL   RDW 12.7 11.6 - 15.4 %   Platelets 252  150 - 450 x10E3/uL   Neutrophils 60 Not Estab. %   Lymphs 30 Not Estab. %   Monocytes 6 Not Estab. %   Eos 2 Not Estab. %   Basos 1 Not Estab. %   Neutrophils Absolute 4.9 1.4 - 7.0 x10E3/uL   Lymphocytes Absolute 2.4 0.7 - 3.1 x10E3/uL   Monocytes Absolute 0.5 0.1 - 0.9 x10E3/uL   EOS (ABSOLUTE) 0.1 0.0 - 0.4 x10E3/uL   Basophils Absolute 0.0 0.0 - 0.2 x10E3/uL   Immature Granulocytes 1 Not Estab. %   Immature Grans (Abs) 0.0 0.0 - 0.1 x10E3/uL  Lipid Panel w/o Chol/HDL Ratio  Result Value Ref Range   Cholesterol, Total 165 100 - 199 mg/dL   Triglycerides 127 0 - 149 mg/dL   HDL 38 (L) >39 mg/dL   VLDL Cholesterol Cal 23 5 - 40 mg/dL   LDL Chol Calc (NIH) 104 (H) 0 - 99 mg/dL  TSH  Result Value Ref Range   TSH 1.640 0.450 - 4.500 uIU/mL  Urinalysis, Routine w reflex microscopic  Result Value Ref Range   Specific Gravity, UA 1.025 1.005 - 1.030   pH, UA 6.0 5.0 - 7.5   Color, UA Yellow Yellow   Appearance Ur Clear Clear   Leukocytes,UA Negative Negative   Protein,UA Negative Negative/Trace   Glucose, UA Negative Negative   Ketones, UA Negative Negative   RBC, UA Negative Negative   Bilirubin, UA Negative Negative   Urobilinogen, Ur 0.2 0.2 - 1.0 mg/dL   Nitrite, UA Negative Negative  Hepatitis C Antibody  Result Value Ref Range   Hep C Virus Ab <0.1 0.0 - 0.9 s/co ratio  HIV Antibody (routine testing w rflx)  Result Value Ref Range   HIV Screen 4th Generation wRfx Non Reactive Non Reactive  Testosterone, Free, Total, SHBG  Result Value Ref Range   Testosterone 207 (L) 264 - 916 ng/dL   Testosterone, Free 7.9 6.8 - 21.5 pg/mL   Sex Hormone Binding 24.4 16.5 - 55.9 nmol/L      Assessment & Plan:   Problem List Items Addressed This Visit   None Visit Diagnoses     COVID-19    -  Primary   Already on mulnopirovir and feeling better. Will add prednisone, tussionex and tessalon. Call if not getting better or getting worse. Continue to monitor.        Follow up  plan: Return if symptoms worsen or fail to improve.    This visit was completed via telephone due to the restrictions of the COVID-19 pandemic. All issues as above were discussed and addressed but no physical exam was performed. If it was felt  that the patient should be evaluated in the office, they were directed there. The patient verbally consented to this visit. Patient was unable to complete an audio/visual visit due to Technical difficulties. Due to the catastrophic nature of the COVID-19 pandemic, this visit was done through audio contact only. Location of the patient: home Location of the provider: work Those involved with this call:  Provider: Park Liter, DO CMA: Yvonna Alanis, [place] Desk/Registration: Jill Side  Time spent on call:  21 minutes on the phone discussing health concerns. 30 minutes total spent in review of patient's record and preparation of their chart.

## 2021-06-21 NOTE — Telephone Encounter (Signed)
FYI scheduled today with Dr Laural Benes

## 2021-06-23 NOTE — Telephone Encounter (Signed)
Possible duplicate 

## 2021-06-28 ENCOUNTER — Telehealth: Payer: Self-pay

## 2021-06-28 ENCOUNTER — Encounter: Payer: Self-pay | Admitting: Family Medicine

## 2021-06-28 NOTE — Telephone Encounter (Signed)
appt

## 2021-06-28 NOTE — Telephone Encounter (Signed)
Pt was called and scheduled this Thursday w/Dr. Laural Benes.  Pt was requesting medication to be given before being seen and he was advised that he needs to be seen first and the provider would advise him as what need to be done.

## 2021-06-28 NOTE — Telephone Encounter (Signed)
Copied from CRM [id number]. Topic: General - Other >> Jun 28, 2021  1:39 PM Marylen Ponto wrote: Reason for CRM: Pt stated he is still experiencing symptoms of Covid and requests that Dr. Laural Benes call him back. Pt stated he is not getting any better and would like to discuss next steps. Offered to transfer pt to nurse triage but he declined.

## 2021-06-28 NOTE — Telephone Encounter (Unsigned)
Copied from CRM #[id number]. Topic: General - Other >> Jun 28, 2021  1:39 PM McNeil, Ja-Kwan wrote: Reason for CRM: Pt stated he is still experiencing symptoms of Covid and requests that Dr. Johnson call him back. Pt stated he is not getting any better and would like to discuss next steps. Offered to transfer pt to nurse triage but he declined. 

## 2021-06-30 ENCOUNTER — Encounter: Payer: Self-pay | Admitting: Family Medicine

## 2021-06-30 ENCOUNTER — Other Ambulatory Visit: Payer: Self-pay

## 2021-06-30 ENCOUNTER — Telehealth (INDEPENDENT_AMBULATORY_CARE_PROVIDER_SITE_OTHER): Payer: BC Managed Care – PPO | Admitting: Family Medicine

## 2021-06-30 VITALS — BP 137/83 | HR 82 | Temp 98.0°F | Wt 348.9 lb

## 2021-06-30 DIAGNOSIS — H66001 Acute suppurative otitis media without spontaneous rupture of ear drum, right ear: Secondary | ICD-10-CM

## 2021-06-30 MED ORDER — AMOXICILLIN-POT CLAVULANATE 875-125 MG PO TABS: 1.0000 | ORAL_TABLET | Freq: Two times a day (BID) | ORAL | 0 refills | Status: DC

## 2021-06-30 NOTE — Progress Notes (Signed)
BP 137/83   Pulse 82   Temp 98 F (36.7 C) (Oral)   Wt (!) 348 lb 14.4 oz (158.3 kg)   SpO2 97%   BMI 48.66 kg/m    Subjective:    Patient ID: Shane Oneill, male    DOB: 09-Oct-1981, 40 y.o.   MRN: [id number]  HPI: Shane Oneill is a 40 y.o. male  Chief Complaint  Patient presents with   Ear Pain   UPPER RESPIRATORY TRACT INFECTION- day 11 of COVID, not feeling 100%, but starting to feel a little better Worst symptom: R ear pain and cough Fever: yes Cough: yes Shortness of breath: no Wheezing: no Chest pain: yes, with cough Chest tightness: no Chest congestion: no Nasal congestion: yes Runny nose: no Post nasal drip: yes Sneezing: no Sore throat: no Swollen glands: no Sinus pressure: yes Headache: yes Face pain: no Toothache: no Ear pain: yes "right Ear pressure: yes  Eyes red/itching:no Eye drainage/crusting: no  Vomiting: no Rash: no Fatigue: yes Sick contacts: yes Strep contacts: no  Context: worse Recurrent sinusitis: no Relief with OTC cold/cough medications: no  Treatments attempted: cold/sinus, mucinex, anti-histamine, pseudoephedrine, and cough syrup   Relevant past medical, surgical, family and social history reviewed and updated as indicated. Interim medical history since our last visit reviewed. Allergies and medications reviewed and updated.  Review of Systems  Constitutional:  Positive for chills, diaphoresis and fatigue. Negative for activity change, appetite change, fever and unexpected weight change.  HENT:  Positive for congestion, ear pain and postnasal drip. Negative for dental problem, drooling, ear discharge, facial swelling, hearing loss, mouth sores, nosebleeds, rhinorrhea, sinus pressure, sinus pain, sneezing, sore throat, tinnitus, trouble swallowing and voice change.   Eyes: Negative.   Respiratory:  Positive for cough. Negative for apnea, choking, chest tightness, shortness of breath, wheezing and stridor.   Cardiovascular:  Negative.   Gastrointestinal: Negative.   Psychiatric/Behavioral: Negative.     Per HPI unless specifically indicated above     Objective:    BP 137/83   Pulse 82   Temp 98 F (36.7 C) (Oral)   Wt (!) 348 lb 14.4 oz (158.3 kg)   SpO2 97%   BMI 48.66 kg/m   Wt Readings from Last 3 Encounters:  06/30/21 (!) 348 lb 14.4 oz (158.3 kg)  03/23/21 (!) 362 lb 3.5 oz (164.3 kg)  03/15/21 (!) 366 lb (166 kg)    Physical Exam Vitals and nursing note reviewed.  Constitutional:      General: He is not in acute distress.    Appearance: Normal appearance. He is obese. He is not ill-appearing, toxic-appearing or diaphoretic.  HENT:     Head: Normocephalic and atraumatic.     Right Ear: Ear canal and external ear normal. Tympanic membrane is injected and bulging.     Left Ear: Tympanic membrane, ear canal and external ear normal.     Nose: Nose normal. No congestion or rhinorrhea.     Mouth/Throat:     Mouth: Mucous membranes are moist.     Pharynx: Oropharynx is clear. No oropharyngeal exudate or posterior oropharyngeal erythema.  Eyes:     General: No scleral icterus.       Right eye: No discharge.        Left eye: No discharge.     Extraocular Movements: Extraocular movements intact.     Conjunctiva/sclera: Conjunctivae normal.     Pupils: Pupils are equal, round, and reactive to light.  Cardiovascular:  Rate and Rhythm: Normal rate and regular rhythm.     Pulses: Normal pulses.     Heart sounds: Normal heart sounds. No murmur heard.   No friction rub. No gallop.  Pulmonary:     Effort: Pulmonary effort is normal. No respiratory distress.     Breath sounds: Normal breath sounds. No stridor. No wheezing, rhonchi or rales.  Chest:     Chest wall: No tenderness.  Musculoskeletal:        General: Normal range of motion.     Cervical back: Normal range of motion and neck supple.  Skin:    General: Skin is warm and dry.     Capillary Refill: Capillary refill takes less than 2  seconds.     Coloration: Skin is not jaundiced or pale.     Findings: No bruising, erythema, lesion or rash.  Neurological:     General: No focal deficit present.     Mental Status: He is alert and oriented to person, place, and time. Mental status is at baseline.  Psychiatric:        Mood and Affect: Mood normal.        Behavior: Behavior normal.        Thought Content: Thought content normal.        Judgment: Judgment normal.    Results for orders placed or performed in visit on 03/11/21  Comprehensive metabolic panel  Result Value Ref Range   Glucose 90 65 - 99 mg/dL   BUN 11 6 - 24 mg/dL   Creatinine, Ser 1.28 (H) 0.76 - 1.27 mg/dL   eGFR 73 >59 mL/min/1.73   BUN/Creatinine Ratio 9 9 - 20   Sodium 141 134 - 144 mmol/L   Potassium 3.7 3.5 - 5.2 mmol/L   Chloride 108 (H) 96 - 106 mmol/L   CO2 17 (L) 20 - 29 mmol/L   Calcium 8.8 8.7 - 10.2 mg/dL   Total Protein 7.3 6.0 - 8.5 g/dL   Albumin 4.5 4.0 - 5.0 g/dL   Globulin, Total 2.8 1.5 - 4.5 g/dL   Albumin/Globulin Ratio 1.6 1.2 - 2.2   Bilirubin Total 0.5 0.0 - 1.2 mg/dL   Alkaline Phosphatase 72 44 - 121 IU/L   AST 18 0 - 40 IU/L   ALT 30 0 - 44 IU/L  CBC with Differential/Platelet  Result Value Ref Range   WBC 7.9 3.4 - 10.8 x10E3/uL   RBC 5.06 4.14 - 5.80 x10E6/uL   Hemoglobin 14.7 13.0 - 17.7 g/dL   Hematocrit 44.4 37.5 - 51.0 %   MCV 88 79 - 97 fL   MCH 29.1 26.6 - 33.0 pg   MCHC 33.1 31.5 - 35.7 g/dL   RDW 12.7 11.6 - 15.4 %   Platelets 252 150 - 450 x10E3/uL   Neutrophils 60 Not Estab. %   Lymphs 30 Not Estab. %   Monocytes 6 Not Estab. %   Eos 2 Not Estab. %   Basos 1 Not Estab. %   Neutrophils Absolute 4.9 1.4 - 7.0 x10E3/uL   Lymphocytes Absolute 2.4 0.7 - 3.1 x10E3/uL   Monocytes Absolute 0.5 0.1 - 0.9 x10E3/uL   EOS (ABSOLUTE) 0.1 0.0 - 0.4 x10E3/uL   Basophils Absolute 0.0 0.0 - 0.2 x10E3/uL   Immature Granulocytes 1 Not Estab. %   Immature Grans (Abs) 0.0 0.0 - 0.1 x10E3/uL  Lipid Panel w/o  Chol/HDL Ratio  Result Value Ref Range   Cholesterol, Total 165 100 - 199 mg/dL  Triglycerides 127 0 - 149 mg/dL   HDL 38 (L) >39 mg/dL   VLDL Cholesterol Cal 23 5 - 40 mg/dL   LDL Chol Calc (NIH) 104 (H) 0 - 99 mg/dL  TSH  Result Value Ref Range   TSH 1.640 0.450 - 4.500 uIU/mL  Urinalysis, Routine w reflex microscopic  Result Value Ref Range   Specific Gravity, UA 1.025 1.005 - 1.030   pH, UA 6.0 5.0 - 7.5   Color, UA Yellow Yellow   Appearance Ur Clear Clear   Leukocytes,UA Negative Negative   Protein,UA Negative Negative/Trace   Glucose, UA Negative Negative   Ketones, UA Negative Negative   RBC, UA Negative Negative   Bilirubin, UA Negative Negative   Urobilinogen, Ur 0.2 0.2 - 1.0 mg/dL   Nitrite, UA Negative Negative  Hepatitis C Antibody  Result Value Ref Range   Hep C Virus Ab <0.1 0.0 - 0.9 s/co ratio  HIV Antibody (routine testing w rflx)  Result Value Ref Range   HIV Screen 4th Generation wRfx Non Reactive Non Reactive  Testosterone, Free, Total, SHBG  Result Value Ref Range   Testosterone 207 (L) 264 - 916 ng/dL   Testosterone, Free 7.9 6.8 - 21.5 pg/mL   Sex Hormone Binding 24.4 16.5 - 55.9 nmol/L      Assessment & Plan:   Problem List Items Addressed This Visit   None Visit Diagnoses     Non-recurrent acute suppurative otitis media of right ear without spontaneous rupture of tympanic membrane    -  Primary   Will treat with augmentin. Call if not getting better or getting worse. Continue to monitor.    Relevant Medications   amoxicillin-clavulanate (AUGMENTIN) 875-125 MG tablet        Follow up plan: Return if symptoms worsen or fail to improve.       

## 2021-07-01 ENCOUNTER — Telehealth: Payer: BC Managed Care – PPO | Admitting: Family Medicine

## 2021-07-15 ENCOUNTER — Encounter: Payer: Self-pay | Admitting: Family Medicine

## 2021-07-15 NOTE — Telephone Encounter (Signed)
Please have him come in at 4:20 for recheck ear. OK to double book

## 2021-07-16 ENCOUNTER — Encounter: Payer: Self-pay | Admitting: Family Medicine

## 2021-07-16 ENCOUNTER — Ambulatory Visit (INDEPENDENT_AMBULATORY_CARE_PROVIDER_SITE_OTHER): Payer: BC Managed Care – PPO | Admitting: Family Medicine

## 2021-07-16 ENCOUNTER — Other Ambulatory Visit: Payer: Self-pay

## 2021-07-16 ENCOUNTER — Other Ambulatory Visit: Payer: Self-pay | Admitting: Oncology

## 2021-07-16 VITALS — BP 130/82 | HR 72 | Temp 98.5°F | Wt 355.2 lb

## 2021-07-16 DIAGNOSIS — H6981 Other specified disorders of Eustachian tube, right ear: Secondary | ICD-10-CM | POA: Diagnosis not present

## 2021-07-16 MED ORDER — PREDNISONE 50 MG PO TABS: 50.0000 mg | ORAL_TABLET | Freq: Every day | ORAL | 0 refills | Status: DC

## 2021-07-16 NOTE — Progress Notes (Signed)
BP 130/82   Pulse 72   Temp 98.5 F (36.9 C) (Oral)   Wt (!) 355 lb 3.2 oz (161.1 kg)   SpO2 97%   BMI 49.54 kg/m    Subjective:    Patient ID: Shane Oneill, male    DOB: 1981-01-10, 40 y.o.   MRN: [id number]  HPI: Shane Oneill is a 40 y.o. male  Chief Complaint  Patient presents with   Ear Pain    2 week f/up for R ear infection    EAG CLOGGED Duration:  2 weeks Involved ear(s):  "right Sensation of feeling clogged/plugged: yes Decreased/muffled hearing:yes Ear pain: no Fever: no Otorrhea: no Hearing loss: yes Upper respiratory infection symptoms: no Using Q-Tips: no Status: stable History of cerumenosis: no Treatments attempted: pseudoephedrine  Relevant past medical, surgical, family and social history reviewed and updated as indicated. Interim medical history since our last visit reviewed. Allergies and medications reviewed and updated.  Review of Systems  Constitutional: Negative.   HENT:  Positive for ear pain. Negative for congestion, dental problem, drooling, ear discharge, facial swelling, hearing loss, mouth sores, nosebleeds, postnasal drip, rhinorrhea, sinus pressure, sinus pain, sneezing, sore throat, tinnitus, trouble swallowing and voice change.   Respiratory: Negative.    Cardiovascular: Negative.   Gastrointestinal: Negative.   Psychiatric/Behavioral: Negative.     Per HPI unless specifically indicated above     Objective:    BP 130/82   Pulse 72   Temp 98.5 F (36.9 C) (Oral)   Wt (!) 355 lb 3.2 oz (161.1 kg)   SpO2 97%   BMI 49.54 kg/m   Wt Readings from Last 3 Encounters:  07/16/21 (!) 355 lb 3.2 oz (161.1 kg)  06/30/21 (!) 348 lb 14.4 oz (158.3 kg)  03/23/21 (!) 362 lb 3.5 oz (164.3 kg)    Physical Exam Vitals and nursing note reviewed.  Constitutional:      General: He is not in acute distress.    Appearance: Normal appearance. He is not ill-appearing, toxic-appearing or diaphoretic.  HENT:     Head: Normocephalic and  atraumatic.     Right Ear: External ear normal. Tympanic membrane is bulging. Tympanic membrane is not erythematous.     Left Ear: Hearing, tympanic membrane and external ear normal. There is no impacted cerumen.     Nose: Nose normal.     Mouth/Throat:     Mouth: Mucous membranes are moist.     Pharynx: Oropharynx is clear.  Eyes:     General: No scleral icterus.       Right eye: No discharge.        Left eye: No discharge.     Extraocular Movements: Extraocular movements intact.     Conjunctiva/sclera: Conjunctivae normal.     Pupils: Pupils are equal, round, and reactive to light.  Cardiovascular:     Rate and Rhythm: Normal rate and regular rhythm.     Pulses: Normal pulses.     Heart sounds: Normal heart sounds. No murmur heard.   No friction rub. No gallop.  Pulmonary:     Effort: Pulmonary effort is normal. No respiratory distress.     Breath sounds: Normal breath sounds. No stridor. No wheezing, rhonchi or rales.  Chest:     Chest wall: No tenderness.  Musculoskeletal:        General: Normal range of motion.     Cervical back: Normal range of motion and neck supple.  Skin:    General: Skin is warm  and dry.     Capillary Refill: Capillary refill takes less than 2 seconds.     Coloration: Skin is not jaundiced or pale.     Findings: No bruising, erythema, lesion or rash.  Neurological:     General: No focal deficit present.     Mental Status: He is alert and oriented to person, place, and time. Mental status is at baseline.  Psychiatric:        Mood and Affect: Mood normal.        Behavior: Behavior normal.        Thought Content: Thought content normal.        Judgment: Judgment normal.    Results for orders placed or performed in visit on 03/11/21  Comprehensive metabolic panel  Result Value Ref Range   Glucose 90 65 - 99 mg/dL   BUN 11 6 - 24 mg/dL   Creatinine, Ser 1.28 (H) 0.76 - 1.27 mg/dL   eGFR 73 >59 mL/min/1.73   BUN/Creatinine Ratio 9 9 - 20   Sodium  141 134 - 144 mmol/L   Potassium 3.7 3.5 - 5.2 mmol/L   Chloride 108 (H) 96 - 106 mmol/L   CO2 17 (L) 20 - 29 mmol/L   Calcium 8.8 8.7 - 10.2 mg/dL   Total Protein 7.3 6.0 - 8.5 g/dL   Albumin 4.5 4.0 - 5.0 g/dL   Globulin, Total 2.8 1.5 - 4.5 g/dL   Albumin/Globulin Ratio 1.6 1.2 - 2.2   Bilirubin Total 0.5 0.0 - 1.2 mg/dL   Alkaline Phosphatase 72 44 - 121 IU/L   AST 18 0 - 40 IU/L   ALT 30 0 - 44 IU/L  CBC with Differential/Platelet  Result Value Ref Range   WBC 7.9 3.4 - 10.8 x10E3/uL   RBC 5.06 4.14 - 5.80 x10E6/uL   Hemoglobin 14.7 13.0 - 17.7 g/dL   Hematocrit 44.4 37.5 - 51.0 %   MCV 88 79 - 97 fL   MCH 29.1 26.6 - 33.0 pg   MCHC 33.1 31.5 - 35.7 g/dL   RDW 12.7 11.6 - 15.4 %   Platelets 252 150 - 450 x10E3/uL   Neutrophils 60 Not Estab. %   Lymphs 30 Not Estab. %   Monocytes 6 Not Estab. %   Eos 2 Not Estab. %   Basos 1 Not Estab. %   Neutrophils Absolute 4.9 1.4 - 7.0 x10E3/uL   Lymphocytes Absolute 2.4 0.7 - 3.1 x10E3/uL   Monocytes Absolute 0.5 0.1 - 0.9 x10E3/uL   EOS (ABSOLUTE) 0.1 0.0 - 0.4 x10E3/uL   Basophils Absolute 0.0 0.0 - 0.2 x10E3/uL   Immature Granulocytes 1 Not Estab. %   Immature Grans (Abs) 0.0 0.0 - 0.1 x10E3/uL  Lipid Panel w/o Chol/HDL Ratio  Result Value Ref Range   Cholesterol, Total 165 100 - 199 mg/dL   Triglycerides 127 0 - 149 mg/dL   HDL 38 (L) >39 mg/dL   VLDL Cholesterol Cal 23 5 - 40 mg/dL   LDL Chol Calc (NIH) 104 (H) 0 - 99 mg/dL  TSH  Result Value Ref Range   TSH 1.640 0.450 - 4.500 uIU/mL  Urinalysis, Routine w reflex microscopic  Result Value Ref Range   Specific Gravity, UA 1.025 1.005 - 1.030   pH, UA 6.0 5.0 - 7.5   Color, UA Yellow Yellow   Appearance Ur Clear Clear   Leukocytes,UA Negative Negative   Protein,UA Negative Negative/Trace   Glucose, UA Negative Negative   Ketones, UA Negative  Negative   RBC, UA Negative Negative   Bilirubin, UA Negative Negative   Urobilinogen, Ur 0.2 0.2 - 1.0 mg/dL   Nitrite,  UA Negative Negative  Hepatitis C Antibody  Result Value Ref Range   Hep C Virus Ab <0.1 0.0 - 0.9 s/co ratio  HIV Antibody (routine testing w rflx)  Result Value Ref Range   HIV Screen 4th Generation wRfx Non Reactive Non Reactive  Testosterone, Free, Total, SHBG  Result Value Ref Range   Testosterone 207 (L) 264 - 916 ng/dL   Testosterone, Free 7.9 6.8 - 21.5 pg/mL   Sex Hormone Binding 24.4 16.5 - 55.9 nmol/L      Assessment & Plan:   Problem List Items Addressed This Visit   None Visit Diagnoses     ETD (Eustachian tube dysfunction), right    -  Primary   Will treat with prednisone. Call if not getting better or getting worse.        Follow up plan: Return if symptoms worsen or fail to improve.

## 2021-07-19 ENCOUNTER — Other Ambulatory Visit: Payer: Self-pay

## 2021-07-19 ENCOUNTER — Encounter: Payer: Self-pay | Admitting: Oncology

## 2021-07-19 MED ORDER — APIXABAN 5 MG PO TABS: 5.0000 mg | ORAL_TABLET | Freq: Two times a day (BID) | ORAL | 1 refills | Status: DC

## 2021-08-27 ENCOUNTER — Telehealth: Payer: Self-pay | Admitting: Oncology

## 2021-08-27 NOTE — Telephone Encounter (Signed)
Left VM with patient to make him aware of changes made to the [facility]. His lab work remains the same but we have moved his appointment with Dr. Smith Robert to the following week in [place].

## 2021-09-14 ENCOUNTER — Other Ambulatory Visit: Payer: Self-pay | Admitting: Oncology

## 2021-09-22 ENCOUNTER — Ambulatory Visit: Payer: BC Managed Care – PPO | Admitting: Oncology

## 2021-09-22 ENCOUNTER — Inpatient Hospital Stay: Payer: BC Managed Care – PPO | Attending: Oncology

## 2021-09-22 ENCOUNTER — Other Ambulatory Visit: Payer: Self-pay

## 2021-09-22 DIAGNOSIS — I2699 Other pulmonary embolism without acute cor pulmonale: Secondary | ICD-10-CM | POA: Insufficient documentation

## 2021-09-22 DIAGNOSIS — D6851 Activated protein C resistance: Secondary | ICD-10-CM | POA: Insufficient documentation

## 2021-09-22 LAB — CBC WITH DIFFERENTIAL/PLATELET
Abs Immature Granulocytes: 0.03 10*3/uL (ref 0.00–0.07)
Basophils Absolute: 0 10*3/uL (ref 0.0–0.1)
Basophils Relative: 1 %
Eosinophils Absolute: 0.1 10*3/uL (ref 0.0–0.5)
Eosinophils Relative: 2 %
HCT: 42 % (ref 39.0–52.0)
Hemoglobin: 13.9 g/dL (ref 13.0–17.0)
Immature Granulocytes: 1 %
Lymphocytes Relative: 30 %
Lymphs Abs: 1.9 10*3/uL (ref 0.7–4.0)
MCH: 28.4 pg (ref 26.0–34.0)
MCHC: 33.1 g/dL (ref 30.0–36.0)
MCV: 85.7 fL (ref 80.0–100.0)
Monocytes Absolute: 0.3 10*3/uL (ref 0.1–1.0)
Monocytes Relative: 5 %
Neutro Abs: 4.1 10*3/uL (ref 1.7–7.7)
Neutrophils Relative %: 61 %
Platelets: 244 10*3/uL (ref 150–400)
RBC: 4.9 MIL/uL (ref 4.22–5.81)
RDW: 15.1 % (ref 11.5–15.5)
WBC: 6.4 10*3/uL (ref 4.0–10.5)
nRBC: 0 % (ref 0.0–0.2)

## 2021-09-22 LAB — COMPREHENSIVE METABOLIC PANEL
ALT: 25 U/L (ref 0–44)
AST: 18 U/L (ref 15–41)
Albumin: 4.2 g/dL (ref 3.5–5.0)
Alkaline Phosphatase: 55 U/L (ref 38–126)
Anion gap: 5 (ref 5–15)
BUN: 14 mg/dL (ref 6–20)
CO2: 22 mmol/L (ref 22–32)
Calcium: 8.4 mg/dL — ABNORMAL LOW (ref 8.9–10.3)
Chloride: 110 mmol/L (ref 98–111)
Creatinine, Ser: 1.34 mg/dL — ABNORMAL HIGH (ref 0.61–1.24)
GFR, Estimated: >60 mL/min (ref >60)
Glucose, Bld: 156 mg/dL — ABNORMAL HIGH (ref 70–99)
Potassium: 3.3 mmol/L — ABNORMAL LOW (ref 3.5–5.1)
Sodium: 137 mmol/L (ref 135–145)
Total Bilirubin: 1 mg/dL (ref 0.3–1.2)
Total Protein: 7.4 g/dL (ref 6.5–8.1)

## 2021-09-27 ENCOUNTER — Telehealth: Payer: Self-pay | Admitting: Oncology

## 2021-09-27 NOTE — Telephone Encounter (Signed)
Pt called to caancel his appt for 11-2. Needs to reschedule. Call back at [phone]

## 2021-09-29 ENCOUNTER — Inpatient Hospital Stay: Payer: BC Managed Care – PPO | Admitting: Oncology

## 2021-10-04 ENCOUNTER — Telehealth: Payer: Self-pay | Admitting: Oncology

## 2021-10-04 NOTE — Telephone Encounter (Signed)
Patient rescheduled. Left VM and  sent MyChart message.

## 2021-10-04 NOTE — Telephone Encounter (Signed)
Pt called to cancel his appt on 11-8. Needs to reschedule. Please call back at [phone]

## 2021-10-05 ENCOUNTER — Inpatient Hospital Stay: Payer: BC Managed Care – PPO | Admitting: Oncology

## 2021-10-25 ENCOUNTER — Inpatient Hospital Stay: Payer: BC Managed Care – PPO | Attending: Oncology | Admitting: Oncology

## 2021-10-25 ENCOUNTER — Other Ambulatory Visit: Payer: Self-pay

## 2021-10-25 ENCOUNTER — Encounter: Payer: Self-pay | Admitting: Oncology

## 2021-10-25 VITALS — BP 146/94 | HR 71 | Temp 97.8°F | Resp 18 | Wt 364.6 lb

## 2021-10-25 DIAGNOSIS — Z86711 Personal history of pulmonary embolism: Secondary | ICD-10-CM | POA: Diagnosis not present

## 2021-10-25 DIAGNOSIS — I2699 Other pulmonary embolism without acute cor pulmonale: Secondary | ICD-10-CM | POA: Insufficient documentation

## 2021-10-25 DIAGNOSIS — D6851 Activated protein C resistance: Secondary | ICD-10-CM | POA: Insufficient documentation

## 2021-10-25 DIAGNOSIS — Z7901 Long term (current) use of anticoagulants: Secondary | ICD-10-CM | POA: Diagnosis not present

## 2021-10-25 DIAGNOSIS — Z87891 Personal history of nicotine dependence: Secondary | ICD-10-CM | POA: Diagnosis not present

## 2021-10-25 NOTE — Progress Notes (Signed)
Hematology/Oncology Consult note [facility]  Telephone:([phone] [phone]  Patient Care Team: Valerie Roys, DO as PCP - General (Family Medicine)   Name of the patient: Shane Oneill  GZ:[id number]  07-23-1981   Date of visit: 10/25/21  Diagnosis-history of pulmonary embolism on Eliquis  Chief complaint/ Reason for visit-routine follow-up of pulmonary embolism  Heme/Onc history: Shane Oneill is a 40 y.o. male with a history of right lower extremity superficial thrombosis x 3 and a history of bilateral pulmonary emboli.  He denies any precipating events.  He sits at a desk (sedentary).  He denies any family history of thrombosis.  He is on Eliquis.   Chest CT angiogram on 04/20/2018 revealed multiple acute segmental pulmonary emboli in the lungs bilaterally.  There were small left upper lobe lingula and dependent left lower lobe pulmonary infarctions.  There was a small left pleural effusion.   Bilateral lower extremity duplex on 05/04/2018 revealed no evidence of DVT within either lower extremity.   Hypercoagulable work-up on 04/30/2018 revealed Factor V Leiden (single [id number] mutation).  Normal studies included:  prothrombin gene mutation, lupus anticoagulant panel, anticardiolipin antibodies, beta-2 glycoprotein antibodies.  Normal studies on 08/09/2018 included protein C antigen (97%) and activity (129%), protein S antigen (74%) and activity (68%) and ATIII antigen (92%) and activity (110%).    Interval history-patient is currently tolerating Eliquis well and denies any significant side effects.  He is trying to lose weight.  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  ECOG PS- 1 Pain scale- 0   Review of systems- Review of Systems  Constitutional:  Negative for chills, fever, malaise/fatigue and weight loss.  HENT:  Negative for congestion, ear discharge and nosebleeds.   Eyes:  Negative for blurred vision.  Respiratory:   Negative for cough, hemoptysis, sputum production, shortness of breath and wheezing.   Cardiovascular:  Negative for chest pain, palpitations, orthopnea and claudication.  Gastrointestinal:  Negative for abdominal pain, blood in stool, constipation, diarrhea, heartburn, melena, nausea and vomiting.  Genitourinary:  Negative for dysuria, flank pain, frequency, hematuria and urgency.  Musculoskeletal:  Negative for back pain, joint pain and myalgias.  Skin:  Negative for rash.  Neurological:  Negative for dizziness, tingling, focal weakness, seizures, weakness and headaches.  Endo/Heme/Allergies:  Does not bruise/bleed easily.  Psychiatric/Behavioral:  Negative for depression and suicidal ideas. The patient does not have insomnia.      Allergies  Allergen Reactions   Cefoxitin Hives     Past Medical History:  Diagnosis Date   Anxiety    Blood clot in vein    Idiopathic intracranial hypertension    Sleep apnea      Past Surgical History:  Procedure Laterality Date   club feet      Social History   Socioeconomic History   Marital status: Single    Spouse name: Not on file   Number of children: Not on file   Years of education: Not on file   Highest education level: Not on file  Occupational History   Not on file  Tobacco Use   Smoking status: Former    Packs/day: 0.50    Years: 10.00    Pack years: 5.00    Types: Cigarettes    Quit date: 11/28/2015    Years since quitting: 5.9   Smokeless tobacco: Never  Vaping Use   Vaping Use: Never used  Substance and Sexual Activity   Alcohol use: Not  Currently   Drug use: Never   Sexual activity: Yes  Other Topics Concern   Not on file  Social History Narrative   Not on file   Social Determinants of Health   Financial Resource Strain: Not on file  Food Insecurity: Not on file  Transportation Needs: Not on file  Physical Activity: Not on file  Stress: Not on file  Social Connections: Not on file  Intimate Partner  Violence: Not on file    Family History  Problem Relation Age of Onset   Hypertension Mother    Healthy Father    Cancer Maternal Aunt      Current Outpatient Medications:    acetaZOLAMIDE (DIAMOX) 250 MG tablet, Take 1,500 mg by mouth 2 (two) times daily., Disp: , Rfl:    ALPRAZolam (XANAX) 0.5 MG tablet, alprazolam 0.5 mg tablet daily PRN, Disp: 30 tablet, Rfl: 0   ELIQUIS 5 MG TABS tablet, TAKE 1 TABLET(5 MG) BY MOUTH TWICE DAILY, Disp: 60 tablet, Rfl: 1   omeprazole (PRILOSEC) 20 MG capsule, Take 1 capsule (20 mg total) by mouth daily., Disp: 90 capsule, Rfl: 1   predniSONE (DELTASONE) 50 MG tablet, Take 1 tablet (50 mg total) by mouth daily with breakfast., Disp: 5 tablet, Rfl: 0   sertraline (ZOLOFT) 100 MG tablet, TAKE 2 TABLETS(200 MG) BY MOUTH DAILY, Disp: 180 tablet, Rfl: 1   benzonatate (TESSALON) 200 MG capsule, Take 1 capsule (200 mg total) by mouth 2 (two) times daily as needed for cough. (Patient not taking: Reported on 07/16/2021), Disp: 20 capsule, Rfl: 0   chlorpheniramine-HYDROcodone (TUSSIONEX PENNKINETIC ER) 10-8 MG/5ML SUER, Take 5 mLs by mouth every 12 (twelve) hours as needed. (Patient not taking: Reported on 07/16/2021), Disp: 50 mL, Rfl: 0   testosterone (ANDROGEL) 50 MG/5GM (1%) GEL, Place 5 g onto the skin daily., Disp: 150 g, Rfl: 2  Physical exam:  Vitals:   10/25/21 1058  BP: (!) 146/94  Pulse: 71  Resp: 18  Temp: 97.8 F (36.6 C)  TempSrc: Tympanic  SpO2: 98%  Weight: (!) 364 lb 9.6 oz (165.4 kg)   Physical Exam Constitutional:      General: He is not in acute distress. Cardiovascular:     Rate and Rhythm: Normal rate and regular rhythm.     Heart sounds: Normal heart sounds.  Pulmonary:     Effort: Pulmonary effort is normal.     Breath sounds: Normal breath sounds.  Abdominal:     General: Bowel sounds are normal.     Palpations: Abdomen is soft.  Skin:    General: Skin is warm and dry.  Neurological:     Mental Status: He is alert and  oriented to person, place, and time.     CMP Latest Ref Rng & Units 09/22/2021  Glucose 70 - 99 mg/dL 156(H)  BUN 6 - 20 mg/dL 14  Creatinine 0.61 - 1.24 mg/dL 1.34(H)  Sodium 135 - 145 mmol/L 137  Potassium 3.5 - 5.1 mmol/L 3.3(L)  Chloride 98 - 111 mmol/L 110  CO2 22 - 32 mmol/L 22  Calcium 8.9 - 10.3 mg/dL 8.4(L)  Total Protein 6.5 - 8.1 g/dL 7.4  Total Bilirubin 0.3 - 1.2 mg/dL 1.0  Alkaline Phos 38 - 126 U/L 55  AST 15 - 41 U/L 18  ALT 0 - 44 U/L 25   CBC Latest Ref Rng & Units 09/22/2021  WBC 4.0 - 10.5 K/uL 6.4  Hemoglobin 13.0 - 17.0 g/dL 13.9  Hematocrit 39.0 -  52.0 % 42.0  Platelets 150 - 400 K/uL 244    Assessment and plan- Patient is a 40 y.o. male with history of superficial venous thrombosis and bilateral pulmonary emboli in the past here for a routine follow-up  Patient has limited mobility due to his sedentary nature of job as well as obesity which is a risk factor for recurrent pulmonary embolism.  He is currently on Eliquis which she will continue indefinitely.  We did discuss if he tried to come off Eliquis there is a risk of recurrent episodes of thrombosis.  Patient is concerned about his ongoing risk factors and therefore would like to remain on Eliquis.  He can continue to follow-up with Dr. Laural Benes at this point and get refills for Eliquis through her.  He does not require any follow-up with me at this time but he can be referred to [place] in the future if questions or concerns arise   Visit Diagnosis 1. Current use of long term anticoagulation   2. History of pulmonary embolism      Dr. Owens Shark, MD, MPH [facility] at [facility] [phone] 10/25/2021 4:46 PM

## 2021-11-16 ENCOUNTER — Other Ambulatory Visit: Payer: Self-pay | Admitting: Oncology

## 2021-12-16 ENCOUNTER — Other Ambulatory Visit: Payer: Self-pay | Admitting: Family Medicine

## 2021-12-16 ENCOUNTER — Other Ambulatory Visit: Payer: Self-pay

## 2021-12-16 ENCOUNTER — Encounter: Payer: Self-pay | Admitting: Family Medicine

## 2021-12-16 MED ORDER — APIXABAN 5 MG PO TABS: ORAL_TABLET | ORAL | 0 refills | Status: DC

## 2021-12-16 NOTE — Telephone Encounter (Signed)
Hi Dr. Wynetta Emery,   I met with Dr. Janese Banks at the end of November and she said that I dont need to see her anymore.  She said that shed like me to continue to use Eliquis but to have it refilled through your office.  She said that she would document this in the visit notes.   I currently need to refill this prescription.  Are you able to submit this to [facility] in [place] for me?   Thank you, Debbe Bales  Refill sent in December was not received at the pharmacy per [facility] in [place].

## 2021-12-16 NOTE — Telephone Encounter (Signed)
Courtesy refill. Patient will need an office visit for further refills. Requested Prescriptions  Pending Prescriptions Disp Refills   omeprazole (PRILOSEC) 20 MG capsule [Pharmacy Med Name: OMEPRAZOLE DR 20 MG CAPSULE] 90 capsule 0    Sig: TAKE 1 CAPSULE BY MOUTH DAILY     Gastroenterology: Proton Pump Inhibitors Passed - 12/16/2021  3:13 AM      Passed - Valid encounter within last 12 months    Recent Outpatient Visits          5 months ago ETD (Eustachian tube dysfunction), right   [facility], Megan P, DO   5 months ago Non-recurrent acute suppurative otitis media of right ear without spontaneous rupture of tympanic membrane   [facility], Megan P, DO   5 months ago COVID-19   [facility], Elizabeth, DO   9 months ago Routine general medical examination at a health care facility   [facility], Megan P, DO   9 months ago Low testosterone   [facility], Megan P, DO              sertraline (ZOLOFT) 100 MG tablet [Pharmacy Med Name: SERTRALINE HCL 100 MG TABLET] 30 tablet 0    Sig: TAKE 2 TABLETS BY MOUTH DAILY     Psychiatry:  Antidepressants - SSRI Passed - 12/16/2021  3:13 AM      Passed - Valid encounter within last 6 months    Recent Outpatient Visits          5 months ago ETD (Eustachian tube dysfunction), right   [facility], Megan P, DO   5 months ago Non-recurrent acute suppurative otitis media of right ear without spontaneous rupture of tympanic membrane   [facility], Megan P, DO   5 months ago COVID-19   [facility], Megan P, DO   9 months ago Routine general medical examination at a health care facility   [facility], Megan P, DO   9 months ago Low testosterone   [facility] [place], [place], DO

## 2021-12-16 NOTE — Telephone Encounter (Signed)
Lmom for patient to call back to schedule follow up visit with PCP for medication refill.

## 2021-12-16 NOTE — Telephone Encounter (Signed)
Appointment scheduled.

## 2021-12-29 ENCOUNTER — Encounter: Payer: Self-pay | Admitting: Family Medicine

## 2021-12-29 ENCOUNTER — Other Ambulatory Visit: Payer: Self-pay

## 2021-12-29 ENCOUNTER — Ambulatory Visit (INDEPENDENT_AMBULATORY_CARE_PROVIDER_SITE_OTHER): Payer: BC Managed Care – PPO | Admitting: Family Medicine

## 2021-12-29 DIAGNOSIS — F419 Anxiety disorder, unspecified: Secondary | ICD-10-CM

## 2021-12-29 DIAGNOSIS — D6851 Activated protein C resistance: Secondary | ICD-10-CM

## 2021-12-29 DIAGNOSIS — R7989 Other specified abnormal findings of blood chemistry: Secondary | ICD-10-CM | POA: Diagnosis not present

## 2021-12-29 MED ORDER — OMEPRAZOLE 20 MG PO CPDR: 20.0000 mg | DELAYED_RELEASE_CAPSULE | Freq: Every day | ORAL | 3 refills | Status: DC

## 2021-12-29 MED ORDER — APIXABAN 5 MG PO TABS: ORAL_TABLET | ORAL | 3 refills | Status: DC

## 2021-12-29 MED ORDER — SERTRALINE HCL 100 MG PO TABS: ORAL_TABLET | ORAL | 1 refills | Status: DC

## 2021-12-29 NOTE — Progress Notes (Signed)
BP 132/84    Pulse 80    Temp 98.9 F (37.2 C) (Oral)    Ht 5' 11.8" (1.824 m)    Wt (!) 365 lb 6.4 oz (165.7 kg)    SpO2 98%    BMI 49.83 kg/m    Subjective:    Patient ID: Shane Oneill, male    DOB: 1981-02-25, 41 y.o.   MRN: [id number]  HPI: Shane Oneill is a 41 y.o. male  Chief Complaint  Patient presents with   Anxiety   Low Testosterone   Medication     Pt states he was getting his Eliquis filled with Hematology and last time he saw them, he states that they told him that he no longer needed to be seen with them and that his PCP should refill the medication   ANXIETY/STRESS Duration Chronic Status:controlled Anxious mood: no  Excessive worrying: no Irritability: no  Sweating: no Nausea: no Palpitations:no Hyperventilation: no Panic attacks: no Agoraphobia: no  Obscessions/compulsions: no Depressed mood: no Depression screen [facility] 2/9 12/29/2021 03/15/2021 03/12/2020 09/06/2019 10/03/2018  Decreased Interest 0 0 0 0 0  Down, Depressed, Hopeless 0 0 0 0 0  PHQ - 2 Score 0 0 0 0 0  Altered sleeping 0 - 1 0 0  Tired, decreased energy 1 - 0 0 0  Change in appetite 0 - 0 0 0  Feeling bad or failure about yourself  0 - 0 0 0  Trouble concentrating 0 - 0 0 0  Moving slowly or fidgety/restless 0 - 0 0 0  Suicidal thoughts 0 - 0 0 0  PHQ-9 Score 1 - 1 0 0  Difficult doing work/chores Not difficult at all - - - Not difficult at all   Anhedonia: no Weight changes: no Insomnia: no   Hypersomnia: no Fatigue/loss of energy: no Feelings of worthlessness: no Feelings of guilt: no Impaired concentration/indecisiveness: no Suicidal ideations: no  Crying spells: no Recent Stressors/Life Changes: no   Relationship problems: no   Family stress: no     Financial stress: no    Job stress: no    Recent death/loss: no  LOW TESTOSTERONE- has been off the meds for about 6 months or so, he got out of the routine when he was using it. He did feel better when he was using Duration:  chronic Status: uncontrolled  Satisfied with current treatment:  no Previous testosterone therapies: androgel Medication side effects:  no Medication compliance: poor compliance Decreased libido: yes Fatigue: yes Depressed mood: yes Muscle weakness: no Erectile dysfunction: no  Has been tolerating his eliquis with no bleeding. Feeling well. No concerns.   Relevant past medical, surgical, family and social history reviewed and updated as indicated. Interim medical history since our last visit reviewed. Allergies and medications reviewed and updated.  Review of Systems  Constitutional: Negative.   Respiratory: Negative.    Cardiovascular: Negative.   Gastrointestinal: Negative.   Musculoskeletal: Negative.   Neurological: Negative.   Psychiatric/Behavioral: Negative.     Per HPI unless specifically indicated above     Objective:    BP 132/84    Pulse 80    Temp 98.9 F (37.2 C) (Oral)    Ht 5' 11.8" (1.824 m)    Wt (!) 365 lb 6.4 oz (165.7 kg)    SpO2 98%    BMI 49.83 kg/m   Wt Readings from Last 3 Encounters:  12/29/21 (!) 365 lb 6.4 oz (165.7 kg)  10/25/21 (!) 364 lb 9.6 oz (  165.4 kg)  07/16/21 (!) 355 lb 3.2 oz (161.1 kg)    Physical Exam Vitals and nursing note reviewed.  Constitutional:      General: He is not in acute distress.    Appearance: Normal appearance. He is not ill-appearing, toxic-appearing or diaphoretic.  HENT:     Head: Normocephalic and atraumatic.     Right Ear: External ear normal.     Left Ear: External ear normal.     Nose: Nose normal.     Mouth/Throat:     Mouth: Mucous membranes are moist.     Pharynx: Oropharynx is clear.  Eyes:     General: No scleral icterus.       Right eye: No discharge.        Left eye: No discharge.     Extraocular Movements: Extraocular movements intact.     Conjunctiva/sclera: Conjunctivae normal.     Pupils: Pupils are equal, round, and reactive to light.  Cardiovascular:     Rate and Rhythm: Normal rate  and regular rhythm.     Pulses: Normal pulses.     Heart sounds: Normal heart sounds. No murmur heard.   No friction rub. No gallop.  Pulmonary:     Effort: Pulmonary effort is normal. No respiratory distress.     Breath sounds: Normal breath sounds. No stridor. No wheezing, rhonchi or rales.  Chest:     Chest wall: No tenderness.  Musculoskeletal:        General: Normal range of motion.     Cervical back: Normal range of motion and neck supple.  Skin:    General: Skin is warm and dry.     Capillary Refill: Capillary refill takes less than 2 seconds.     Coloration: Skin is not jaundiced or pale.     Findings: No bruising, erythema, lesion or rash.  Neurological:     General: No focal deficit present.     Mental Status: He is alert and oriented to person, place, and time. Mental status is at baseline.  Psychiatric:        Mood and Affect: Mood normal.        Behavior: Behavior normal.        Thought Content: Thought content normal.        Judgment: Judgment normal.    Results for orders placed or performed in visit on 12/29/21  Testosterone, free, total([facility]/Sun[facility])  Result Value Ref Range   Testosterone 124 (L) 264 - 916 ng/dL   Testosterone, Free WILL FOLLOW    Sex Hormone Binding 20.6 16.5 - 55.9 nmol/L  CBC with Differential/Platelet  Result Value Ref Range   WBC 7.0 3.4 - 10.8 x10E3/uL   RBC 4.81 4.14 - 5.80 x10E6/uL   Hemoglobin 13.9 13.0 - 17.7 g/dL   Hematocrit 41.6 37.5 - 51.0 %   MCV 87 79 - 97 fL   MCH 28.9 26.6 - 33.0 pg   MCHC 33.4 31.5 - 35.7 g/dL   RDW 12.8 11.6 - 15.4 %   Platelets 247 150 - 450 x10E3/uL   Neutrophils 62 Not Estab. %   Lymphs 31 Not Estab. %   Monocytes 5 Not Estab. %   Eos 1 Not Estab. %   Basos 0 Not Estab. %   Neutrophils Absolute 4.3 1.4 - 7.0 x10E3/uL   Lymphocytes Absolute 2.2 0.7 - 3.1 x10E3/uL   Monocytes Absolute 0.3 0.1 - 0.9 x10E3/uL   EOS (ABSOLUTE) 0.1 0.0 - 0.4 x10E3/uL   Basophils  Absolute 0.0 0.0 - 0.2 x10E3/uL    Immature Granulocytes 1 Not Estab. %   Immature Grans (Abs) 0.0 0.0 - 0.1 x10E3/uL      Assessment & Plan:   Problem List Items Addressed This Visit       Hematopoietic and Hemostatic   Factor V Leiden mutation ([place])    Under good control on current regimen. Continue current regimen. Continue to monitor. Call with any concerns. Refills given. Labs drawn today.         Other   Anxiety    Under good control on current regimen. Continue current regimen. Continue to monitor. Call with any concerns. Refills given.        Relevant Medications   sertraline (ZOLOFT) 100 MG tablet   Low testosterone    Has been off his meds. Will recheck labs and restart his meds. Recheck in about 3 months and adjust treatment as needed. Continue to monitor.       Relevant Orders   Testosterone, free, total([facility])     Follow up plan: Return in about 6 months (around 06/28/2022).

## 2021-12-30 NOTE — Assessment & Plan Note (Signed)
Under good control on current regimen. Continue current regimen. Continue to monitor. Call with any concerns. Refills given.   

## 2021-12-30 NOTE — Assessment & Plan Note (Signed)
Under good control on current regimen. Continue current regimen. Continue to monitor. Call with any concerns. Refills given. Labs drawn today.   

## 2021-12-30 NOTE — Assessment & Plan Note (Signed)
Has been off his meds. Will recheck labs and restart his meds. Recheck in about 3 months and adjust treatment as needed. Continue to monitor.

## 2021-12-31 LAB — CBC WITH DIFFERENTIAL/PLATELET
Basophils Absolute: 0 10*3/uL (ref 0.0–0.2)
Basos: 0 %
EOS (ABSOLUTE): 0.1 10*3/uL (ref 0.0–0.4)
Eos: 1 %
Hematocrit: 41.6 % (ref 37.5–51.0)
Hemoglobin: 13.9 g/dL (ref 13.0–17.7)
Immature Grans (Abs): 0 10*3/uL (ref 0.0–0.1)
Immature Granulocytes: 1 %
Lymphocytes Absolute: 2.2 10*3/uL (ref 0.7–3.1)
Lymphs: 31 %
MCH: 28.9 pg (ref 26.6–33.0)
MCHC: 33.4 g/dL (ref 31.5–35.7)
MCV: 87 fL (ref 79–97)
Monocytes Absolute: 0.3 10*3/uL (ref 0.1–0.9)
Monocytes: 5 %
Neutrophils Absolute: 4.3 10*3/uL (ref 1.4–7.0)
Neutrophils: 62 %
Platelets: 247 10*3/uL (ref 150–450)
RBC: 4.81 x10E6/uL (ref 4.14–5.80)
RDW: 12.8 % (ref 11.6–15.4)
WBC: 7 10*3/uL (ref 3.4–10.8)

## 2021-12-31 LAB — TESTOSTERONE, FREE, TOTAL, SHBG
Sex Hormone Binding: 20.6 nmol/L (ref 16.5–55.9)
Testosterone, Free: 8.8 pg/mL (ref 6.8–21.5)
Testosterone: 124 ng/dL — ABNORMAL LOW (ref 264–916)

## 2022-01-19 ENCOUNTER — Encounter: Payer: Self-pay | Admitting: Family Medicine

## 2022-01-19 ENCOUNTER — Other Ambulatory Visit: Payer: Self-pay | Admitting: Family Medicine

## 2022-01-24 ENCOUNTER — Other Ambulatory Visit: Payer: Self-pay

## 2022-01-24 NOTE — Telephone Encounter (Signed)
I've never written this medicine for him

## 2022-01-24 NOTE — Telephone Encounter (Signed)
Patient is requesting a refill Diamox. Patient states that he is out

## 2022-06-28 ENCOUNTER — Ambulatory Visit (INDEPENDENT_AMBULATORY_CARE_PROVIDER_SITE_OTHER): Payer: BC Managed Care – PPO | Admitting: Family Medicine

## 2022-06-28 ENCOUNTER — Encounter: Payer: Self-pay | Admitting: Family Medicine

## 2022-06-28 VITALS — BP 130/83 | HR 72 | Temp 98.1°F | Wt 360.4 lb

## 2022-06-28 DIAGNOSIS — D6851 Activated protein C resistance: Secondary | ICD-10-CM

## 2022-06-28 DIAGNOSIS — R7989 Other specified abnormal findings of blood chemistry: Secondary | ICD-10-CM | POA: Diagnosis not present

## 2022-06-28 DIAGNOSIS — F419 Anxiety disorder, unspecified: Secondary | ICD-10-CM | POA: Diagnosis not present

## 2022-06-28 MED ORDER — OMEPRAZOLE 20 MG PO CPDR: 20.0000 mg | DELAYED_RELEASE_CAPSULE | Freq: Every day | ORAL | 3 refills | Status: DC

## 2022-06-28 MED ORDER — ALPRAZOLAM 0.5 MG PO TABS: ORAL_TABLET | ORAL | 0 refills | Status: DC

## 2022-06-28 MED ORDER — APIXABAN 5 MG PO TABS: ORAL_TABLET | ORAL | 3 refills | Status: DC

## 2022-06-28 MED ORDER — SERTRALINE HCL 100 MG PO TABS: ORAL_TABLET | ORAL | 1 refills | Status: DC

## 2022-06-28 NOTE — Assessment & Plan Note (Signed)
Has been off meds. Last check was low. Will repeat today and restart medicine as needed. Call with any concerns. Recheck 3 months.

## 2022-06-28 NOTE — Progress Notes (Signed)
BP 130/83   Pulse 72   Temp 98.1 F (36.7 C)   Wt (!) 360 lb 6.4 oz (163.5 kg)   SpO2 97%   BMI 49.15 kg/m    Subjective:    Patient ID: Shane Oneill, male    DOB: 08-06-1981, 41 y.o.   MRN: [id number]  HPI: Shane Oneill is a 41 y.o. male  Chief Complaint  Patient presents with   Factor V Leiden mutation    Low Testosterone    Patient states is been several months that he stopped using testosterone gel, would like to talk about getting back on it.    LOW TESTOSTERONE- has been off his testosterone for a few months and not feeling well. Duration: chronic Status: uncontrolled  Satisfied with current treatment:  no Previous testosterone therapies: androgel Medication side effects:  none Medication compliance: poor compliance Decreased libido: yes Fatigue: yes Depressed mood: yes Muscle weakness: yes Erectile dysfunction: yes Cognition not doing well. Having trouble with work  ANXIETY/STRESS Duration: chronic Status:stable Anxious mood: yes  Excessive worrying: yes Irritability: no  Sweating: no Nausea: no Palpitations:no Hyperventilation: no Panic attacks: no Agoraphobia: no  Obscessions/compulsions: no Depressed mood: no    06/28/2022   10:28 AM 12/29/2021   10:19 AM 03/15/2021    2:52 PM 03/12/2020    8:50 AM 09/06/2019    8:37 AM  Depression screen PHQ 2/9  Decreased Interest 0 0 0 0 0  Down, Depressed, Hopeless 1 0 0 0 0  PHQ - 2 Score 1 0 0 0 0  Altered sleeping 0 0  1 0  Tired, decreased energy 2 1  0 0  Change in appetite 0 0  0 0  Feeling bad or failure about yourself  0 0  0 0  Trouble concentrating 0 0  0 0  Moving slowly or fidgety/restless 0 0  0 0  Suicidal thoughts 0 0  0 0  PHQ-9 Score 3 1  1  0  Difficult doing work/chores Somewhat difficult Not difficult at all         06/28/2022   10:29 AM 12/29/2021   10:19 AM 03/15/2021    2:53 PM 09/11/2020    8:49 AM  GAD 7 : Generalized Anxiety Score  Nervous, Anxious, on Edge 1 0 0 0   Control/stop worrying 0 0 0 0  Worry too much - different things 1 0 1 0  Trouble relaxing 0 0 0 0  Restless 0 0 0 0  Easily annoyed or irritable 1 0 1 0  Afraid - awful might happen 0 0 0 0  Total GAD 7 Score 3 0 2 0  Anxiety Difficulty Somewhat difficult Not difficult at all Not difficult at all Not difficult at all   Anhedonia: no Weight changes: no Insomnia: no   Hypersomnia: no Fatigue/loss of energy: yes Feelings of worthlessness: yes Feelings of guilt: no Impaired concentration/indecisiveness: no Suicidal ideations: no  Crying spells: no Recent Stressors/Life Changes: yes   Relationship problems: yes   Family stress: yes     Financial stress: yes    Job stress: yes    Recent death/loss: no   Relevant past medical, surgical, family and social history reviewed and updated as indicated. Interim medical history since our last visit reviewed. Allergies and medications reviewed and updated.  Review of Systems  Constitutional:  Positive for fatigue. Negative for activity change, appetite change, chills, diaphoresis, fever and unexpected weight change.  Respiratory: Negative.    Cardiovascular:  Negative.   Gastrointestinal: Negative.   Musculoskeletal: Negative.   Skin: Negative.   Neurological: Negative.   Psychiatric/Behavioral:  Positive for dysphoric mood. Negative for agitation, behavioral problems, confusion, decreased concentration, hallucinations, self-injury, sleep disturbance and suicidal ideas. The patient is not nervous/anxious and is not hyperactive.     Per HPI unless specifically indicated above     Objective:    BP 130/83   Pulse 72   Temp 98.1 F (36.7 C)   Wt (!) 360 lb 6.4 oz (163.5 kg)   SpO2 97%   BMI 49.15 kg/m   Wt Readings from Last 3 Encounters:  06/28/22 (!) 360 lb 6.4 oz (163.5 kg)  12/29/21 (!) 365 lb 6.4 oz (165.7 kg)  10/25/21 (!) 364 lb 9.6 oz (165.4 kg)    Physical Exam Vitals and nursing note reviewed.  Constitutional:       General: He is not in acute distress.    Appearance: Normal appearance. He is obese. He is not ill-appearing, toxic-appearing or diaphoretic.  HENT:     Head: Normocephalic and atraumatic.     Right Ear: External ear normal.     Left Ear: External ear normal.     Nose: Nose normal.     Mouth/Throat:     Mouth: Mucous membranes are moist.     Pharynx: Oropharynx is clear.  Eyes:     General: No scleral icterus.       Right eye: No discharge.        Left eye: No discharge.     Extraocular Movements: Extraocular movements intact.     Conjunctiva/sclera: Conjunctivae normal.     Pupils: Pupils are equal, round, and reactive to light.  Cardiovascular:     Rate and Rhythm: Normal rate and regular rhythm.     Pulses: Normal pulses.     Heart sounds: Normal heart sounds. No murmur heard.    No friction rub. No gallop.  Pulmonary:     Effort: Pulmonary effort is normal. No respiratory distress.     Breath sounds: Normal breath sounds. No stridor. No wheezing, rhonchi or rales.  Chest:     Chest wall: No tenderness.  Musculoskeletal:        General: Normal range of motion.     Cervical back: Normal range of motion and neck supple.  Skin:    General: Skin is warm and dry.     Capillary Refill: Capillary refill takes less than 2 seconds.     Coloration: Skin is not jaundiced or pale.     Findings: No bruising, erythema, lesion or rash.  Neurological:     General: No focal deficit present.     Mental Status: He is alert and oriented to person, place, and time. Mental status is at baseline.  Psychiatric:        Mood and Affect: Mood normal.        Behavior: Behavior normal.        Thought Content: Thought content normal.        Judgment: Judgment normal.     Results for orders placed or performed in visit on 12/29/21  Testosterone, free, total([facility])  Result Value Ref Range   Testosterone 124 (L) 264 - 916 ng/dL   Testosterone, Free 8.8 6.8 - 21.5 pg/mL   Sex Hormone  Binding 20.6 16.5 - 55.9 nmol/L  CBC with Differential/Platelet  Result Value Ref Range   WBC 7.0 3.4 - 10.8 x10E3/uL   RBC 4.81 4.14 - 5.80  x10E6/uL   Hemoglobin 13.9 13.0 - 17.7 g/dL   Hematocrit 78.5 88.5 - 51.0 %   MCV 87 79 - 97 fL   MCH 28.9 26.6 - 33.0 pg   MCHC 33.4 31.5 - 35.7 g/dL   RDW 02.7 74.1 - 28.7 %   Platelets 247 150 - 450 x10E3/uL   Neutrophils 62 Not Estab. %   Lymphs 31 Not Estab. %   Monocytes 5 Not Estab. %   Eos 1 Not Estab. %   Basos 0 Not Estab. %   Neutrophils Absolute 4.3 1.4 - 7.0 x10E3/uL   Lymphocytes Absolute 2.2 0.7 - 3.1 x10E3/uL   Monocytes Absolute 0.3 0.1 - 0.9 x10E3/uL   EOS (ABSOLUTE) 0.1 0.0 - 0.4 x10E3/uL   Basophils Absolute 0.0 0.0 - 0.2 x10E3/uL   Immature Granulocytes 1 Not Estab. %   Immature Grans (Abs) 0.0 0.0 - 0.1 x10E3/uL      Assessment & Plan:   Problem List Items Addressed This Visit       Hematopoietic and Hemostatic   Factor V Leiden mutation (HCC) - Primary    Under good control on current regimen. Continue current regimen. Continue to monitor. Call with any concerns. Refills given. Labs drawn today.       Relevant Orders   CBC with Differential/Platelet   Comprehensive metabolic panel     Other   Anxiety    Under good control on current regimen. Continue current regimen. Continue to monitor. Call with any concerns. Refills given. Xanax should last a year.        Relevant Medications   ALPRAZolam (XANAX) 0.5 MG tablet   sertraline (ZOLOFT) 100 MG tablet   Other Relevant Orders   CBC with Differential/Platelet   Comprehensive metabolic panel   Low testosterone    Has been off meds. Last check was low. Will repeat today and restart medicine as needed. Call with any concerns. Recheck 3 months.       Relevant Orders   CBC with Differential/Platelet   Comprehensive metabolic panel   PSA   Testosterone, free, total([facility])     Follow up plan: Return in about 3 months (around 09/28/2022) for  physical.

## 2022-06-28 NOTE — Assessment & Plan Note (Signed)
Under good control on current regimen. Continue current regimen. Continue to monitor. Call with any concerns. Refills given. Labs drawn today.   

## 2022-06-28 NOTE — Assessment & Plan Note (Signed)
Under good control on current regimen. Continue current regimen. Continue to monitor. Call with any concerns. Refills given. Xanax should last a year.

## 2022-06-29 ENCOUNTER — Other Ambulatory Visit: Payer: Self-pay | Admitting: Family Medicine

## 2022-06-29 MED ORDER — TESTOSTERONE 50 MG/5GM (1%) TD GEL: 5.0000 g | Freq: Every day | TRANSDERMAL | 2 refills | Status: DC

## 2022-07-04 LAB — CBC WITH DIFFERENTIAL/PLATELET
Basophils Absolute: 0 10*3/uL (ref 0.0–0.2)
Basos: 0 %
EOS (ABSOLUTE): 0.1 10*3/uL (ref 0.0–0.4)
Eos: 1 %
Hematocrit: 44.1 % (ref 37.5–51.0)
Hemoglobin: 14.5 g/dL (ref 13.0–17.7)
Immature Grans (Abs): 0 10*3/uL (ref 0.0–0.1)
Immature Granulocytes: 0 %
Lymphocytes Absolute: 1.7 10*3/uL (ref 0.7–3.1)
Lymphs: 26 %
MCH: 29.4 pg (ref 26.6–33.0)
MCHC: 32.9 g/dL (ref 31.5–35.7)
MCV: 90 fL (ref 79–97)
Monocytes Absolute: 0.3 10*3/uL (ref 0.1–0.9)
Monocytes: 5 %
Neutrophils Absolute: 4.6 10*3/uL (ref 1.4–7.0)
Neutrophils: 68 %
Platelets: 240 10*3/uL (ref 150–450)
RBC: 4.93 x10E6/uL (ref 4.14–5.80)
RDW: 13 % (ref 11.6–15.4)
WBC: 6.8 10*3/uL (ref 3.4–10.8)

## 2022-07-04 LAB — COMPREHENSIVE METABOLIC PANEL
ALT: 26 IU/L (ref 0–44)
AST: 22 IU/L (ref 0–40)
Albumin/Globulin Ratio: 1.7 (ref 1.2–2.2)
Albumin: 4.3 g/dL (ref 4.1–5.1)
Alkaline Phosphatase: 67 IU/L (ref 44–121)
BUN/Creatinine Ratio: 7 — ABNORMAL LOW (ref 9–20)
BUN: 8 mg/dL (ref 6–24)
Bilirubin Total: 0.8 mg/dL (ref 0.0–1.2)
CO2: 18 mmol/L — ABNORMAL LOW (ref 20–29)
Calcium: 8.9 mg/dL (ref 8.7–10.2)
Chloride: 107 mmol/L — ABNORMAL HIGH (ref 96–106)
Creatinine, Ser: 1.13 mg/dL (ref 0.76–1.27)
Globulin, Total: 2.6 g/dL (ref 1.5–4.5)
Glucose: 129 mg/dL — ABNORMAL HIGH (ref 70–99)
Potassium: 3.4 mmol/L — ABNORMAL LOW (ref 3.5–5.2)
Sodium: 140 mmol/L (ref 134–144)
Total Protein: 6.9 g/dL (ref 6.0–8.5)
eGFR: 84 mL/min/{1.73_m2} (ref >59)

## 2022-07-04 LAB — TESTOSTERONE, FREE, TOTAL, SHBG
Sex Hormone Binding: 24.9 nmol/L (ref 16.5–55.9)
Testosterone, Free: 4.7 pg/mL — ABNORMAL LOW (ref 6.8–21.5)
Testosterone: 123 ng/dL — ABNORMAL LOW (ref 264–916)

## 2022-07-04 LAB — PSA: Prostate Specific Ag, Serum: 0.5 ng/mL (ref 0.0–4.0)

## 2022-07-09 ENCOUNTER — Telehealth: Payer: BC Managed Care – PPO | Admitting: Nurse Practitioner

## 2022-07-09 DIAGNOSIS — L237 Allergic contact dermatitis due to plants, except food: Secondary | ICD-10-CM | POA: Diagnosis not present

## 2022-07-09 MED ORDER — PREDNISONE 10 MG PO TABS: ORAL_TABLET | ORAL | 0 refills | Status: DC

## 2022-07-09 MED ORDER — HYDROXYZINE HCL 10 MG PO TABS: 10.0000 mg | ORAL_TABLET | Freq: Three times a day (TID) | ORAL | 0 refills | Status: DC | PRN

## 2022-07-09 NOTE — Progress Notes (Signed)
Virtual Visit Consent   Vermon Grays, you are scheduled for a virtual visit with a [facility] provider today. Just as with appointments in the office, your consent must be obtained to participate. Your consent will be active for this visit and any virtual visit you may have with one of our providers in the next 365 days. If you have a MyChart account, a copy of this consent can be sent to you electronically.  As this is a virtual visit, video technology does not allow for your provider to perform a traditional examination. This may limit your provider's ability to fully assess your condition. If your provider identifies any concerns that need to be evaluated in person or the need to arrange testing (such as labs, EKG, etc.), we will make arrangements to do so. Although advances in technology are sophisticated, we cannot ensure that it will always work on either your end or our end. If the connection with a video visit is poor, the visit may have to be switched to a telephone visit. With either a video or telephone visit, we are not always able to ensure that we have a secure connection.  By engaging in this virtual visit, you consent to the provision of healthcare and authorize for your insurance to be billed (if applicable) for the services provided during this visit. Depending on your insurance coverage, you may receive a charge related to this service.  I need to obtain your verbal consent now. Are you willing to proceed with your visit today? Shane Oneill has provided verbal consent on 07/09/2022 for a virtual visit (video or telephone). Claiborne Rigg, NP  Date: 07/09/2022 5:41 PM  Virtual Visit via Video Note   I, Claiborne Rigg, connected with  Shane Oneill  ([id number], 05/29/1981) on 07/09/22 at  5:30 PM EDT by a video-enabled telemedicine application and verified that I am speaking with the correct person using two identifiers.  Location: Patient: Virtual Visit Location Patient:  Home Provider: Virtual Visit Location Provider: Home Office   I discussed the limitations of evaluation and management by telemedicine and the availability of in person appointments. The patient expressed understanding and agreed to proceed.    Patient complains of a rash. Symptoms began a few days ago. Patient describes the rash as erythematous, red, diffuse, vesicles. Characteristics of rash and associated history: Similar rash in the past? yes, Is rash pruritic?  yes, Is rash painful?  yes, Skin exposure to potential irritants at home or work or from hobbies?poison ivy. Patient's previous dermatologic history includes acne.  .  Problems:  Patient Active Problem List   Diagnosis Date Noted   Low testosterone 03/13/2021   Gastroesophageal reflux disease 10/03/2018   Controlled substance agreement signed 10/03/2018   Anxiety 08/15/2018   OSA (obstructive sleep apnea) 08/15/2018   Factor V Leiden mutation (HCC) 05/08/2018   Pulmonary emboli (HCC) 05/01/2018   IIH (idiopathic intracranial hypertension) 09/15/2017   Agatston coronary artery calcium score between 200 and 399 07/16/2016    Allergies:  Allergies  Allergen Reactions   Cefoxitin Hives   Medications:  Current Outpatient Medications:    hydrOXYzine (ATARAX) 10 MG tablet, Take 1 tablet (10 mg total) by mouth 3 (three) times daily as needed., Disp: 30 tablet, Rfl: 0   predniSONE (DELTASONE) 10 MG tablet, Days 1-4 take 4 tablets (40 mg) daily  Days 5-8 take 3 tablets (30 mg) daily, Days 9-11 take 2 tablets (20 mg) daily, Days 12-14 take 1 tablet (10 mg)  daily., Disp: 37 tablet, Rfl: 0   acetaZOLAMIDE (DIAMOX) 250 MG tablet, Take 1,500 mg by mouth 2 (two) times daily., Disp: , Rfl:    ALPRAZolam (XANAX) 0.5 MG tablet, alprazolam 0.5 mg tablet daily PRN, Disp: 30 tablet, Rfl: 0   apixaban (ELIQUIS) 5 MG TABS tablet, TAKE 1 TABLET(5 MG) BY MOUTH TWICE DAILY, Disp: 180 tablet, Rfl: 3   omeprazole (PRILOSEC) 20 MG capsule, Take 1  capsule (20 mg total) by mouth daily., Disp: 90 capsule, Rfl: 3   sertraline (ZOLOFT) 100 MG tablet, TAKE 2 TABLETS BY MOUTH DAILY, Disp: 180 tablet, Rfl: 1   testosterone (ANDROGEL) 50 MG/5GM (1%) GEL, Place 5 g onto the skin daily., Disp: 150 g, Rfl: 2  Observations/Objective: Patient is well-developed, well-nourished in no acute distress.  Resting comfortably  at home.  Head is normocephalic, atraumatic.  No labored breathing.  Speech is clear and coherent with logical content.  Patient is alert and oriented at baseline.    Assessment and Plan: 1. Poison ivy dermatitis - hydrOXYzine (ATARAX) 10 MG tablet; Take 1 tablet (10 mg total) by mouth 3 (three) times daily as needed.  Dispense: 30 tablet; Refill: 0 - predniSONE (DELTASONE) 10 MG tablet; Days 1-4 take 4 tablets (40 mg) daily  Days 5-8 take 3 tablets (30 mg) daily, Days 9-11 take 2 tablets (20 mg) daily, Days 12-14 take 1 tablet (10 mg) daily.  Dispense: 37 tablet; Refill: 0  Apply cool compresses to area for pain relief.   Follow Up Instructions: I discussed the assessment and treatment plan with the patient. The patient was provided an opportunity to ask questions and all were answered. The patient agreed with the plan and demonstrated an understanding of the instructions.  A copy of instructions were sent to the patient via MyChart unless otherwise noted below.     The patient was advised to call back or seek an in-person evaluation if the symptoms worsen or if the condition fails to improve as anticipated.  Time:  I spent 12 minutes with the patient via telehealth technology discussing the above problems/concerns.    Claiborne Rigg, NP

## 2022-07-09 NOTE — Patient Instructions (Addendum)
  Shane Oneill, thank you for joining Claiborne Rigg, NP for today's virtual visit.  While this provider is not your primary care provider (PCP), if your PCP is located in our provider database this encounter information will be shared with them immediately following your visit.  Consent: (Patient) Shane Oneill provided verbal consent for this virtual visit at the beginning of the encounter.  Current Medications:  Current Outpatient Medications:    hydrOXYzine (ATARAX) 10 MG tablet, Take 1 tablet (10 mg total) by mouth 3 (three) times daily as needed., Disp: 30 tablet, Rfl: 0   predniSONE (DELTASONE) 10 MG tablet, Days 1-4 take 4 tablets (40 mg) daily  Days 5-8 take 3 tablets (30 mg) daily, Days 9-11 take 2 tablets (20 mg) daily, Days 12-14 take 1 tablet (10 mg) daily., Disp: 37 tablet, Rfl: 0   acetaZOLAMIDE (DIAMOX) 250 MG tablet, Take 1,500 mg by mouth 2 (two) times daily., Disp: , Rfl:    ALPRAZolam (XANAX) 0.5 MG tablet, alprazolam 0.5 mg tablet daily PRN, Disp: 30 tablet, Rfl: 0   apixaban (ELIQUIS) 5 MG TABS tablet, TAKE 1 TABLET(5 MG) BY MOUTH TWICE DAILY, Disp: 180 tablet, Rfl: 3   omeprazole (PRILOSEC) 20 MG capsule, Take 1 capsule (20 mg total) by mouth daily., Disp: 90 capsule, Rfl: 3   sertraline (ZOLOFT) 100 MG tablet, TAKE 2 TABLETS BY MOUTH DAILY, Disp: 180 tablet, Rfl: 1   testosterone (ANDROGEL) 50 MG/5GM (1%) GEL, Place 5 g onto the skin daily., Disp: 150 g, Rfl: 2   Medications ordered in this encounter:  Meds ordered this encounter  Medications   hydrOXYzine (ATARAX) 10 MG tablet    Sig: Take 1 tablet (10 mg total) by mouth 3 (three) times daily as needed.    Dispense:  30 tablet    Refill:  0    Order Specific Question:   Supervising Provider    Answer:   Hyacinth Meeker, BRIAN [[id number]]   predniSONE (DELTASONE) 10 MG tablet    Sig: Days 1-4 take 4 tablets (40 mg) daily  Days 5-8 take 3 tablets (30 mg) daily, Days 9-11 take 2 tablets (20 mg) daily, Days 12-14 take 1 tablet (10 mg)  daily.    Dispense:  37 tablet    Refill:  0    Order Specific Question:   Supervising Provider    Answer:   Hyacinth Meeker, BRIAN [[id number]]     *If you need refills on other medications prior to your next appointment, please contact your pharmacy*  Follow-Up: Call back or seek an in-person evaluation if the symptoms worsen or if the condition fails to improve as anticipated.  Other Instructions Apply cool compresses to affected area for relief.    If you have been instructed to have an in-person evaluation today at a local Urgent Care facility, please use the link below. It will take you to a list of all of our available [facility], including address, phone number and hours of operation. Please do not delay care.  [facility]  If you or a family member do not have a primary care provider, use the link below to schedule a visit and establish care. When you choose a [facility] primary care physician or advanced practice provider, you gain a long-term partner in health. Find a Primary Care Provider  Learn more about [facility]'s in-office and virtual care options:  - Get Care Now

## 2022-07-13 ENCOUNTER — Encounter: Payer: Self-pay | Admitting: Family Medicine

## 2022-07-15 ENCOUNTER — Telehealth (INDEPENDENT_AMBULATORY_CARE_PROVIDER_SITE_OTHER): Payer: BC Managed Care – PPO | Admitting: Family Medicine

## 2022-07-15 ENCOUNTER — Encounter: Payer: Self-pay | Admitting: Family Medicine

## 2022-07-15 DIAGNOSIS — L237 Allergic contact dermatitis due to plants, except food: Secondary | ICD-10-CM

## 2022-07-15 MED ORDER — CLOBETASOL PROPIONATE 0.05 % EX OINT: 1.0000 | TOPICAL_OINTMENT | Freq: Two times a day (BID) | CUTANEOUS | 0 refills | Status: DC

## 2022-07-15 NOTE — Progress Notes (Signed)
There were no vitals taken for this visit.   Subjective:    Patient ID: Shane Oneill, male    DOB: [birth year], 41 y.o.   MRN: [id number]  HPI: Shane Oneill is a 41 y.o. male  Chief Complaint  Patient presents with   Poison Ivy    Patient did virtual visit for poison ivy was prescribed prednisone and hydroxyzine but states he felt sick taking them so he stopped. Patient states he has a red, itchy rash on right arm, and both legs for about a week.    RASH Duration:  weeks  Location: arm and leg  Itching: yes Burning: yes Redness: yes Oozing: no Scaling: no Blisters: yes Painful: no Fevers: no Change in detergents/soaps/personal care products: no Recent illness: no Recent travel:no History of same: yes Context: stable Alleviating factors: prednisone and hydroxyzine Treatments attempted:  Shortness of breath: no  Throat/tongue swelling: no Myalgias/arthralgias: no  Relevant past medical, surgical, family and social history reviewed and updated as indicated. Interim medical history since our last visit reviewed. Allergies and medications reviewed and updated.  Review of Systems  Constitutional: Negative.   Respiratory: Negative.    Cardiovascular: Negative.   Gastrointestinal: Negative.   Musculoskeletal: Negative.   Skin:  Positive for rash. Negative for color change, pallor and wound.  Psychiatric/Behavioral: Negative.      Per HPI unless specifically indicated above     Objective:    There were no vitals taken for this visit.  Wt Readings from Last 3 Encounters:  06/28/22 (!) 360 lb 6.4 oz (163.5 kg)  12/29/21 (!) 365 lb 6.4 oz (165.7 kg)  10/25/21 (!) 364 lb 9.6 oz (165.4 kg)    Physical Exam Vitals and nursing note reviewed.  Constitutional:      General: He is not in acute distress.    Appearance: Normal appearance. He is not ill-appearing, toxic-appearing or diaphoretic.  HENT:     Head: Normocephalic and atraumatic.     Right Ear: External ear  normal.     Left Ear: External ear normal.     Nose: Nose normal.     Mouth/Throat:     Mouth: Mucous membranes are moist.     Pharynx: Oropharynx is clear.  Eyes:     General: No scleral icterus.       Right eye: No discharge.        Left eye: No discharge.     Conjunctiva/sclera: Conjunctivae normal.     Pupils: Pupils are equal, round, and reactive to light.  Pulmonary:     Effort: Pulmonary effort is normal. No respiratory distress.     Comments: Speaking in full sentences Musculoskeletal:        General: Normal range of motion.     Cervical back: Normal range of motion.  Skin:    Coloration: Skin is not jaundiced or pale.     Findings: Rash (R arm) present. No bruising, erythema or lesion.  Neurological:     Mental Status: He is alert and oriented to person, place, and time. Mental status is at baseline.  Psychiatric:        Mood and Affect: Mood normal.        Behavior: Behavior normal.        Thought Content: Thought content normal.        Judgment: Judgment normal.     Results for orders placed or performed in visit on 06/28/22  CBC with Differential/Platelet  Result Value Ref Range  WBC 6.8 3.4 - 10.8 x10E3/uL   RBC 4.93 4.14 - 5.80 x10E6/uL   Hemoglobin 14.5 13.0 - 17.7 g/dL   Hematocrit 44.1 37.5 - 51.0 %   MCV 90 79 - 97 fL   MCH 29.4 26.6 - 33.0 pg   MCHC 32.9 31.5 - 35.7 g/dL   RDW 13.0 11.6 - 15.4 %   Platelets 240 150 - 450 x10E3/uL   Neutrophils 68 Not Estab. %   Lymphs 26 Not Estab. %   Monocytes 5 Not Estab. %   Eos 1 Not Estab. %   Basos 0 Not Estab. %   Neutrophils Absolute 4.6 1.4 - 7.0 x10E3/uL   Lymphocytes Absolute 1.7 0.7 - 3.1 x10E3/uL   Monocytes Absolute 0.3 0.1 - 0.9 x10E3/uL   EOS (ABSOLUTE) 0.1 0.0 - 0.4 x10E3/uL   Basophils Absolute 0.0 0.0 - 0.2 x10E3/uL   Immature Granulocytes 0 Not Estab. %   Immature Grans (Abs) 0.0 0.0 - 0.1 x10E3/uL  Comprehensive metabolic panel  Result Value Ref Range   Glucose 129 (H) 70 - 99 mg/dL    BUN 8 6 - 24 mg/dL   Creatinine, Ser 1.13 0.76 - 1.27 mg/dL   eGFR 84 >59 mL/min/1.73   BUN/Creatinine Ratio 7 (L) 9 - 20   Sodium 140 134 - 144 mmol/L   Potassium 3.4 (L) 3.5 - 5.2 mmol/L   Chloride 107 (H) 96 - 106 mmol/L   CO2 18 (L) 20 - 29 mmol/L   Calcium 8.9 8.7 - 10.2 mg/dL   Total Protein 6.9 6.0 - 8.5 g/dL   Albumin 4.3 4.1 - 5.1 g/dL   Globulin, Total 2.6 1.5 - 4.5 g/dL   Albumin/Globulin Ratio 1.7 1.2 - 2.2   Bilirubin Total 0.8 0.0 - 1.2 mg/dL   Alkaline Phosphatase 67 44 - 121 IU/L   AST 22 0 - 40 IU/L   ALT 26 0 - 44 IU/L  PSA  Result Value Ref Range   Prostate Specific Ag, Serum 0.5 0.0 - 4.0 ng/mL  Testosterone, free, total([facility])  Result Value Ref Range   Testosterone 123 (L) 264 - 916 ng/dL   Testosterone, Free 4.7 (L) 6.8 - 21.5 pg/mL   Sex Hormone Binding 24.9 16.5 - 55.9 nmol/L      Assessment & Plan:   Problem List Items Addressed This Visit   None Visit Diagnoses     Poison ivy dermatitis    -  Primary   Felt really poorly on the prednisone and hydroxyzine. May have been the hydroxyzine. Treat with Allegra and clobetasol, if not better Mon, restart pred only.         Follow up plan: Return if symptoms worsen or fail to improve.   This visit was completed via video visit through MyChart due to the restrictions of the COVID-19 pandemic. All issues as above were discussed and addressed. Physical exam was done as above through visual confirmation on video through MyChart. If it was felt that the patient should be evaluated in the office, they were directed there. The patient verbally consented to this visit. Location of the patient: home Location of the provider: work Those involved with this call:  Provider: Park Liter, DO CMA: Louanna Raw, [place] Desk/Registration: [employer]  Time spent on call:  15 minutes with patient face to face via video conference. More than 50% of this time was spent in counseling and coordination  of care. 23 minutes total spent in review of patient's record and preparation  of their chart.

## 2022-07-15 NOTE — Telephone Encounter (Signed)
Can you get him on for virtual visit? OK to double book my 4:20

## 2022-08-27 ENCOUNTER — Other Ambulatory Visit: Payer: Self-pay | Admitting: Family Medicine

## 2022-08-29 ENCOUNTER — Encounter: Payer: Self-pay | Admitting: Family Medicine

## 2022-08-29 ENCOUNTER — Other Ambulatory Visit: Payer: Self-pay

## 2022-08-29 MED ORDER — SERTRALINE HCL 100 MG PO TABS
ORAL_TABLET | ORAL | 1 refills | Status: DC
Start: 2022-08-29 — End: 2022-10-14

## 2022-08-29 NOTE — Telephone Encounter (Signed)
Refilled 06/28/2022 #[id number] 1 rf. Requested Prescriptions  Pending Prescriptions Disp Refills  . sertraline (ZOLOFT) 100 MG tablet [Pharmacy Med Name: SERTRALINE HCL 100 MG TABLET] 180 tablet 1    Sig: TAKE 2 TABLETS BY MOUTH DAILY     Psychiatry:  Antidepressants - SSRI - sertraline Passed - 08/27/2022  9:17 AM      Passed - AST in normal range and within 360 days    AST  Date Value Ref Range Status  06/28/2022 22 0 - 40 IU/L Final         Passed - ALT in normal range and within 360 days    ALT  Date Value Ref Range Status  06/28/2022 26 0 - 44 IU/L Final         Passed - Completed PHQ-2 or PHQ-9 in the last 360 days      Passed - Valid encounter within last 6 months    Recent Outpatient Visits          1 month ago Poison ivy dermatitis   [place], [place], DO   2 months ago Factor V Leiden mutation ([place])   [place], [place], DO   8 months ago Low testosterone   [place], [place], DO   1 year ago ETD (Eustachian tube dysfunction), right   [employer], Megan P, DO   1 year ago Non-recurrent acute suppurative otitis media of right ear without spontaneous rupture of tympanic membrane   [place], [place], DO      Future Appointments            In 1 month Johnson, Barb Merino, DO [employer], [facility]

## 2022-08-29 NOTE — Telephone Encounter (Signed)
Patient requesting medication be sent to [facility] in [place], last sent to [facility].

## 2022-10-14 ENCOUNTER — Encounter: Payer: Self-pay | Admitting: Family Medicine

## 2022-10-14 ENCOUNTER — Ambulatory Visit (INDEPENDENT_AMBULATORY_CARE_PROVIDER_SITE_OTHER): Payer: BC Managed Care – PPO | Admitting: Family Medicine

## 2022-10-14 VITALS — BP 144/85 | HR 80 | Temp 98.4°F | Ht 73.0 in | Wt 373.0 lb

## 2022-10-14 DIAGNOSIS — R030 Elevated blood-pressure reading, without diagnosis of hypertension: Secondary | ICD-10-CM | POA: Diagnosis not present

## 2022-10-14 DIAGNOSIS — R7989 Other specified abnormal findings of blood chemistry: Secondary | ICD-10-CM

## 2022-10-14 DIAGNOSIS — Z23 Encounter for immunization: Secondary | ICD-10-CM

## 2022-10-14 DIAGNOSIS — F419 Anxiety disorder, unspecified: Secondary | ICD-10-CM

## 2022-10-14 DIAGNOSIS — Z0000 Encounter for general adult medical examination without abnormal findings: Secondary | ICD-10-CM | POA: Diagnosis not present

## 2022-10-14 DIAGNOSIS — Z136 Encounter for screening for cardiovascular disorders: Secondary | ICD-10-CM | POA: Diagnosis not present

## 2022-10-14 LAB — URINALYSIS, ROUTINE W REFLEX MICROSCOPIC
Bilirubin, UA: NEGATIVE
Glucose, UA: NEGATIVE
Ketones, UA: NEGATIVE
Leukocytes,UA: NEGATIVE
Nitrite, UA: NEGATIVE
Protein,UA: NEGATIVE
RBC, UA: NEGATIVE
Specific Gravity, UA: 1.015 (ref 1.005–1.030)
Urobilinogen, Ur: 0.2 mg/dL (ref 0.2–1.0)
pH, UA: 7 (ref 5.0–7.5)

## 2022-10-14 MED ORDER — SERTRALINE HCL 100 MG PO TABS: ORAL_TABLET | ORAL | 1 refills | Status: DC

## 2022-10-14 MED ORDER — ALPRAZOLAM 0.5 MG PO TABS: ORAL_TABLET | ORAL | 0 refills | Status: DC

## 2022-10-14 NOTE — Assessment & Plan Note (Signed)
Rechecking labs today. Await results. Treat as needed.  °

## 2022-10-14 NOTE — Assessment & Plan Note (Signed)
Acting up. Will continue current regimen. Call with any concerns. Refills given.

## 2022-10-14 NOTE — Progress Notes (Signed)
BP (!) 144/85 (BP Location: Left Arm, Cuff Size: Normal)   Pulse 80   Temp 98.4 F (36.9 C) (Oral)   Ht 6\' 1"  (1.854 m)   Wt (!) 373 lb (169.2 kg)   SpO2 99%   BMI 49.21 kg/m    Subjective:    Patient ID: Shane Oneill, male    DOB: [birth year], 41 y.o.   MRN: [id number]  HPI: Shane Oneill is a 41 y.o. male presenting on 10/14/2022 for comprehensive medical examination. Current medical complaints include:  ANXIETY/STRESS/DEPRESSION- split from his partner of 17 years and shortly after lost his job. He is adjusting, but not doing great. Duration: chronic Status:exacerbated Anxious mood: no  Excessive worrying: no Irritability: no  Sweating: no Nausea: no Palpitations:no Hyperventilation: no Panic attacks: no Agoraphobia: no  Obscessions/compulsions: no Depressed mood: yes    10/14/2022    8:50 AM 06/28/2022   10:28 AM 12/29/2021   10:19 AM 03/15/2021    2:52 PM 03/12/2020    8:50 AM  Depression screen PHQ 2/9  Decreased Interest 0 0 0 0 0  Down, Depressed, Hopeless 1 1 0 0 0  PHQ - 2 Score 1 1 0 0 0  Altered sleeping 0 0 0  1  Tired, decreased energy 0 2 1  0  Change in appetite 0 0 0  0  Feeling bad or failure about yourself  0 0 0  0  Trouble concentrating 0 0 0  0  Moving slowly or fidgety/restless 0 0 0  0  Suicidal thoughts 0 0 0  0  PHQ-9 Score 1 3 1  1   Difficult doing work/chores Not difficult at all Somewhat difficult Not difficult at all        10/14/2022    8:50 AM 06/28/2022   10:29 AM 12/29/2021   10:19 AM 03/15/2021    2:53 PM  GAD 7 : Generalized Anxiety Score  Nervous, Anxious, on Edge 0 1 0 0  Control/stop worrying 0 0 0 0  Worry too much - different things 1 1 0 1  Trouble relaxing 0 0 0 0  Restless 0 0 0 0  Easily annoyed or irritable 0 1 0 1  Afraid - awful might happen 0 0 0 0  Total GAD 7 Score 1 3 0 2  Anxiety Difficulty Not difficult at all Somewhat difficult Not difficult at all Not difficult at all     Anhedonia: no Weight changes:  no Insomnia: no   Hypersomnia: no Fatigue/loss of energy: yes Feelings of worthlessness: yes Feelings of guilt: yes Impaired concentration/indecisiveness: yes Suicidal ideations: no  Crying spells: no Recent Stressors/Life Changes: yes   Relationship problems: yes   Family stress: yes     Financial stress: yes    Job stress: yes    Recent death/loss: yes  LOW TESTOSTERONE Duration: chronic Status: stable  Satisfied with current treatment:  yes Previous testosterone therapies: IM and androgel Medication side effects:  no Medication compliance: excellent compliance Decreased libido: no Fatigue: no Depressed mood: no Muscle weakness: no Erectile dysfunction: no  He currently lives with: shared custody with son Interim Problems from his last visit: no  Depression Screen done today and results listed below:     10/14/2022    8:50 AM 06/28/2022   10:28 AM 12/29/2021   10:19 AM 03/15/2021    2:52 PM 03/12/2020    8:50 AM  Depression screen PHQ 2/9  Decreased Interest 0 0 0 0 0  Down,  Depressed, Hopeless 1 1 0 0 0  PHQ - 2 Score 1 1 0 0 0  Altered sleeping 0 0 0  1  Tired, decreased energy 0 2 1  0  Change in appetite 0 0 0  0  Feeling bad or failure about yourself  0 0 0  0  Trouble concentrating 0 0 0  0  Moving slowly or fidgety/restless 0 0 0  0  Suicidal thoughts 0 0 0  0  PHQ-9 Score 1 3 1  1   Difficult doing work/chores Not difficult at all Somewhat difficult Not difficult at all      Past Medical History:  Past Medical History:  Diagnosis Date   Anxiety    Blood clot in vein    Idiopathic intracranial hypertension    Sleep apnea     Surgical History:  Past Surgical History:  Procedure Laterality Date   club feet      Medications:  Current Outpatient Medications on File Prior to Visit  Medication Sig   acetaZOLAMIDE (DIAMOX) 250 MG tablet Take 1,500 mg by mouth 2 (two) times daily.   apixaban (ELIQUIS) 5 MG TABS tablet TAKE 1 TABLET(5 MG) BY MOUTH  TWICE DAILY   clobetasol ointment (TEMOVATE) 0.05 % Apply 1 Application topically 2 (two) times daily.   omeprazole (PRILOSEC) 20 MG capsule Take 1 capsule (20 mg total) by mouth daily.   testosterone (ANDROGEL) 50 MG/5GM (1%) GEL Place 5 g onto the skin daily.   No current facility-administered medications on file prior to visit.    Allergies:  Allergies  Allergen Reactions   Cefoxitin Hives    Social History:  Social History   Socioeconomic History   Marital status: Single    Spouse name: Not on file   Number of children: Not on file   Years of education: Not on file   Highest education level: Not on file  Occupational History   Not on file  Tobacco Use   Smoking status: Former    Packs/day: 0.50    Years: 10.00    Total pack years: 5.00    Types: Cigarettes    Quit date: 11/28/2015    Years since quitting: 6.8   Smokeless tobacco: Never  Vaping Use   Vaping Use: Never used  Substance and Sexual Activity   Alcohol use: Not Currently   Drug use: Never   Sexual activity: Yes  Other Topics Concern   Not on file  Social History Narrative   Not on file   Social Determinants of Health   Financial Resource Strain: Not on file  Food Insecurity: Not on file  Transportation Needs: Not on file  Physical Activity: Not on file  Stress: Not on file  Social Connections: Not on file  Intimate Partner Violence: Not on file   Social History   Tobacco Use  Smoking Status Former   Packs/day: 0.50   Years: 10.00   Total pack years: 5.00   Types: Cigarettes   Quit date: 11/28/2015   Years since quitting: 6.8  Smokeless Tobacco Never   Social History   Substance and Sexual Activity  Alcohol Use Not Currently    Family History:  Family History  Problem Relation Age of Onset   Hypertension Mother    Healthy Father    Cancer Maternal Aunt     Past medical history, surgical history, medications, allergies, family history and social history reviewed with patient  today and changes made to appropriate areas of the  chart.   Review of Systems  Constitutional: Negative.   HENT: Negative.    Eyes:  Positive for discharge. Negative for blurred vision, double vision, photophobia, pain and redness.  Respiratory: Negative.    Cardiovascular: Negative.   Gastrointestinal: Negative.   Genitourinary:  Positive for dysuria. Negative for flank pain, frequency, hematuria and urgency.  Musculoskeletal: Negative.   Skin: Negative.   Neurological: Negative.   Endo/Heme/Allergies:  Positive for environmental allergies and polydipsia. Does not bruise/bleed easily.  Psychiatric/Behavioral:  Positive for depression. Negative for hallucinations, memory loss, substance abuse and suicidal ideas. The patient is not nervous/anxious and does not have insomnia.    All other ROS negative except what is listed above and in the HPI.      Objective:    BP (!) 144/85 (BP Location: Left Arm, Cuff Size: Normal)   Pulse 80   Temp 98.4 F (36.9 C) (Oral)   Ht  (1.854 m)   Wt (!) 373 lb (169.2 kg)   SpO2 99%   BMI 49.21 kg/m   Wt Readings from Last 3 Encounters:  10/14/22 (!) 373 lb (169.2 kg)  06/28/22 (!) 360 lb 6.4 oz (163.5 kg)  12/29/21 (!) 365 lb 6.4 oz (165.7 kg)    Physical Exam Vitals and nursing note reviewed.  Constitutional:      General: He is not in acute distress.    Appearance: Normal appearance. He is obese. He is not ill-appearing, toxic-appearing or diaphoretic.  HENT:     Head: Normocephalic and atraumatic.     Right Ear: Tympanic membrane, ear canal and external ear normal. There is no impacted cerumen.     Left Ear: Tympanic membrane, ear canal and external ear normal. There is no impacted cerumen.     Nose: Nose normal. No congestion or rhinorrhea.     Mouth/Throat:     Mouth: Mucous membranes are moist.     Pharynx: Oropharynx is clear. No oropharyngeal exudate or posterior oropharyngeal erythema.  Eyes:     General: No scleral icterus.        Right eye: No discharge.        Left eye: No discharge.     Extraocular Movements: Extraocular movements intact.     Conjunctiva/sclera: Conjunctivae normal.     Pupils: Pupils are equal, round, and reactive to light.  Neck:     Vascular: No carotid bruit.  Cardiovascular:     Rate and Rhythm: Normal rate and regular rhythm.     Pulses: Normal pulses.     Heart sounds: No murmur heard.    No friction rub. No gallop.  Pulmonary:     Effort: Pulmonary effort is normal. No respiratory distress.     Breath sounds: Normal breath sounds. No stridor. No wheezing, rhonchi or rales.  Chest:     Chest wall: No tenderness.  Abdominal:     General: Abdomen is flat. Bowel sounds are normal. There is no distension.     Palpations: Abdomen is soft. There is no mass.     Tenderness: There is no abdominal tenderness. There is no right CVA tenderness, left CVA tenderness, guarding or rebound.     Hernia: No hernia is present.  Genitourinary:    Comments: Genital exam deferred with shared decision making Musculoskeletal:        General: No swelling, tenderness, deformity or signs of injury.     Cervical back: Normal range of motion and neck supple. No rigidity. No muscular tenderness.  Right lower leg: No edema.     Left lower leg: No edema.  Lymphadenopathy:     Cervical: No cervical adenopathy.  Skin:    General: Skin is warm and dry.     Capillary Refill: Capillary refill takes less than 2 seconds.     Coloration: Skin is not jaundiced or pale.     Findings: No bruising, erythema, lesion or rash.  Neurological:     General: No focal deficit present.     Mental Status: He is alert and oriented to person, place, and time.     Cranial Nerves: No cranial nerve deficit.     Sensory: No sensory deficit.     Motor: No weakness.     Coordination: Coordination normal.     Gait: Gait normal.     Deep Tendon Reflexes: Reflexes normal.  Psychiatric:        Mood and Affect: Mood normal.         Behavior: Behavior normal.        Thought Content: Thought content normal.        Judgment: Judgment normal.     Results for orders placed or performed in visit on 10/14/22  Urinalysis, Routine w reflex microscopic  Result Value Ref Range   Specific Gravity, UA 1.015 1.005 - 1.030   pH, UA 7.0 5.0 - 7.5   Color, UA Yellow Yellow   Appearance Ur Clear Clear   Leukocytes,UA Negative Negative   Protein,UA Negative Negative/Trace   Glucose, UA Negative Negative   Ketones, UA Negative Negative   RBC, UA Negative Negative   Bilirubin, UA Negative Negative   Urobilinogen, Ur 0.2 0.2 - 1.0 mg/dL   Nitrite, UA Negative Negative   Microscopic Examination Comment       Assessment & Plan:   Problem List Items Addressed This Visit       Other   Anxiety    Acting up. Will continue current regimen. Call with any concerns. Refills given.       Relevant Medications   ALPRAZolam (XANAX) 0.5 MG tablet   sertraline (ZOLOFT) 100 MG tablet   Low testosterone    Rechecking labs today. Await results. Treat as needed.       Relevant Orders   PSA   Other Visit Diagnoses     Routine general medical examination at a health care facility    -  Primary   Vaccines up to date. Screening labs checked today. Continue diet and exercise. Call with any concerns.   Relevant Orders   Comprehensive metabolic panel   CBC with Differential/Platelet   Lipid Panel w/o Chol/HDL Ratio   TSH   Urinalysis, Routine w reflex microscopic (Completed)   PSA   Flu vaccine need       Flu shot given today.   Relevant Orders   Flu Vaccine QUAD 6+ mos PF IM (Fluarix Quad PF) (Completed)   Elevated BP without diagnosis of hypertension       Will start DASH diet and monitor. Recheck 3 months. Call with any concerns.        LABORATORY TESTING:  Health maintenance labs ordered today as discussed above.   The natural history of prostate cancer and ongoing controversy regarding screening and potential  treatment outcomes of prostate cancer has been discussed with the patient. The meaning of a false positive PSA and a false negative PSA has been discussed. He indicates understanding of the limitations of this screening test and wishes to proceed  with screening PSA testing.   IMMUNIZATIONS:   - Tdap: Tetanus vaccination status reviewed: last tetanus booster within 10 years. - Influenza: Administered today - Pneumovax: Not applicable - Prevnar: Not applicable - COVID: Up to date - HPV: Refused  PATIENT COUNSELING:    Sexuality: Discussed sexually transmitted diseases, partner selection, use of condoms, avoidance of unintended pregnancy  and contraceptive alternatives.   Advised to avoid cigarette smoking.  I discussed with the patient that most people either abstain from alcohol or drink within safe limits (<=14/week and <=4 drinks/occasion for males, <=7/weeks and <= 3 drinks/occasion for females) and that the risk for alcohol disorders and other health effects rises proportionally with the number of drinks per week and how often a drinker exceeds daily limits.  Discussed cessation/primary prevention of drug use and availability of treatment for abuse.   Diet: Encouraged to adjust caloric intake to maintain  or achieve ideal body weight, to reduce intake of dietary saturated fat and total fat, to limit sodium intake by avoiding high sodium foods and not adding table salt, and to maintain adequate dietary potassium and calcium preferably from fresh fruits, vegetables, and low-fat dairy products.    stressed the importance of regular exercise  Injury prevention: Discussed safety belts, safety helmets, smoke detector, smoking near bedding or upholstery.   Dental health: Discussed importance of regular tooth brushing, flossing, and dental visits.   Follow up plan: NEXT PREVENTATIVE PHYSICAL DUE IN 1 YEAR. Return in about 3 months (around 01/14/2023).

## 2022-10-18 ENCOUNTER — Other Ambulatory Visit: Payer: Self-pay | Admitting: Family Medicine

## 2022-10-18 LAB — CBC WITH DIFFERENTIAL/PLATELET
Basophils Absolute: 0 10*3/uL (ref 0.0–0.2)
Basos: 0 %
EOS (ABSOLUTE): 0.1 10*3/uL (ref 0.0–0.4)
Eos: 1 %
Hematocrit: 47.9 % (ref 37.5–51.0)
Hemoglobin: 15.2 g/dL (ref 13.0–17.7)
Immature Grans (Abs): 0 10*3/uL (ref 0.0–0.1)
Immature Granulocytes: 1 %
Lymphocytes Absolute: 2.2 10*3/uL (ref 0.7–3.1)
Lymphs: 27 %
MCH: 28 pg (ref 26.6–33.0)
MCHC: 31.7 g/dL (ref 31.5–35.7)
MCV: 88 fL (ref 79–97)
Monocytes Absolute: 0.5 10*3/uL (ref 0.1–0.9)
Monocytes: 6 %
Neutrophils Absolute: 5.4 10*3/uL (ref 1.4–7.0)
Neutrophils: 65 %
Platelets: 295 10*3/uL (ref 150–450)
RBC: 5.43 x10E6/uL (ref 4.14–5.80)
RDW: 12.5 % (ref 11.6–15.4)
WBC: 8.2 10*3/uL (ref 3.4–10.8)

## 2022-10-18 LAB — COMPREHENSIVE METABOLIC PANEL
ALT: 22 IU/L (ref 0–44)
AST: 15 IU/L (ref 0–40)
Albumin/Globulin Ratio: 1.6 (ref 1.2–2.2)
Albumin: 4.2 g/dL (ref 4.1–5.1)
Alkaline Phosphatase: 69 IU/L (ref 44–121)
BUN/Creatinine Ratio: 7 — ABNORMAL LOW (ref 9–20)
BUN: 9 mg/dL (ref 6–24)
Bilirubin Total: 0.6 mg/dL (ref 0.0–1.2)
CO2: 20 mmol/L (ref 20–29)
Calcium: 9 mg/dL (ref 8.7–10.2)
Chloride: 106 mmol/L (ref 96–106)
Creatinine, Ser: 1.31 mg/dL — ABNORMAL HIGH (ref 0.76–1.27)
Globulin, Total: 2.6 g/dL (ref 1.5–4.5)
Glucose: 100 mg/dL — ABNORMAL HIGH (ref 70–99)
Potassium: 3.5 mmol/L (ref 3.5–5.2)
Sodium: 141 mmol/L (ref 134–144)
Total Protein: 6.8 g/dL (ref 6.0–8.5)
eGFR: 70 mL/min/{1.73_m2} (ref >59)

## 2022-10-18 LAB — TESTOSTERONE, FREE, TOTAL, SHBG
Sex Hormone Binding: 23.4 nmol/L (ref 16.5–55.9)
Testosterone, Free: 18.8 pg/mL (ref 6.8–21.5)
Testosterone: 732 ng/dL (ref 264–916)

## 2022-10-18 LAB — LIPID PANEL W/O CHOL/HDL RATIO
Cholesterol, Total: 178 mg/dL (ref 100–199)
HDL: 37 mg/dL — ABNORMAL LOW (ref >39)
LDL Chol Calc (NIH): 122 mg/dL — ABNORMAL HIGH (ref 0–99)
Triglycerides: 105 mg/dL (ref 0–149)
VLDL Cholesterol Cal: 19 mg/dL (ref 5–40)

## 2022-10-18 LAB — PSA: Prostate Specific Ag, Serum: 0.6 ng/mL (ref 0.0–4.0)

## 2022-10-18 LAB — TSH: TSH: 2.38 u[IU]/mL (ref 0.450–4.500)

## 2022-10-18 MED ORDER — TESTOSTERONE 50 MG/5GM (1%) TD GEL: 5.0000 g | Freq: Every day | TRANSDERMAL | 5 refills | Status: AC

## 2022-11-14 ENCOUNTER — Telehealth (INDEPENDENT_AMBULATORY_CARE_PROVIDER_SITE_OTHER): Payer: BC Managed Care – PPO | Admitting: Family Medicine

## 2022-11-14 ENCOUNTER — Encounter: Payer: Self-pay | Admitting: Family Medicine

## 2022-11-14 DIAGNOSIS — U071 COVID-19: Secondary | ICD-10-CM | POA: Diagnosis not present

## 2022-11-14 MED ORDER — BENZONATATE 200 MG PO CAPS: 200.0000 mg | ORAL_CAPSULE | Freq: Two times a day (BID) | ORAL | 0 refills | Status: DC | PRN

## 2022-11-14 MED ORDER — HYDROCOD POLI-CHLORPHE POLI ER 10-8 MG/5ML PO SUER: 5.0000 mL | Freq: Two times a day (BID) | ORAL | 0 refills | Status: DC | PRN

## 2022-11-14 MED ORDER — MOLNUPIRAVIR EUA 200MG CAPSULE: 4.0000 | ORAL_CAPSULE | Freq: Two times a day (BID) | ORAL | 0 refills | Status: AC

## 2022-11-14 NOTE — Progress Notes (Signed)
There were no vitals taken for this visit.   Subjective:    Patient ID: Shane Oneill, male    DOB: [birth year], 41 y.o.   MRN: [id number]  HPI: Shane Oneill is a 41 y.o. male  Chief Complaint  Patient presents with   Covid Positive    Patient tested positive for COVID yesterday evening. Patient says he has tried over the counter decongestant.    Cough   Congestion   Headache   Fatigue   UPPER RESPIRATORY TRACT INFECTION Duration: Saturday PM  Worst symptom: congestion and coughing Fever: no Cough: yes Shortness of breath: no Wheezing: no Chest pain: no Chest tightness: no Chest congestion: no Nasal congestion: yes Runny nose: yes Post nasal drip: yes Sneezing: yes Sore throat: yes Swollen glands: no Sinus pressure: no Headache: yes Face pain: no Toothache: no Ear pain: no  Ear pressure: no  Eyes red/itching:no Eye drainage/crusting: no  Vomiting: no Rash: no Fatigue: yes Sick contacts: no Strep contacts: no  Context: worse Recurrent sinusitis: no Relief with OTC cold/cough medications: no  Treatments attempted: cold/sinus    Relevant past medical, surgical, family and social history reviewed and updated as indicated. Interim medical history since our last visit reviewed. Allergies and medications reviewed and updated.  Review of Systems  Constitutional:  Positive for chills. Negative for activity change, appetite change, diaphoresis, fatigue, fever and unexpected weight change.  HENT:  Positive for congestion and postnasal drip. Negative for dental problem, drooling, ear discharge, ear pain, facial swelling, hearing loss, mouth sores, nosebleeds, rhinorrhea, sinus pressure, sinus pain, sneezing, sore throat, tinnitus, trouble swallowing and voice change.   Eyes: Negative.   Respiratory: Negative.    Cardiovascular: Negative.   Gastrointestinal: Negative.   Psychiatric/Behavioral: Negative.      Per HPI unless specifically indicated above      Objective:    There were no vitals taken for this visit.  Wt Readings from Last 3 Encounters:  10/14/22 (!) 373 lb (169.2 kg)  06/28/22 (!) 360 lb 6.4 oz (163.5 kg)  12/29/21 (!) 365 lb 6.4 oz (165.7 kg)    Physical Exam Vitals and nursing note reviewed.  Constitutional:      General: He is not in acute distress.    Appearance: Normal appearance. He is well-developed. He is obese. He is not ill-appearing, toxic-appearing or diaphoretic.  HENT:     Head: Normocephalic and atraumatic.     Right Ear: External ear normal.     Left Ear: External ear normal.     Nose: Nose normal.     Mouth/Throat:     Mouth: Mucous membranes are moist.     Pharynx: Oropharynx is clear.  Eyes:     General: No scleral icterus.       Right eye: No discharge.        Left eye: No discharge.     Conjunctiva/sclera: Conjunctivae normal.     Pupils: Pupils are equal, round, and reactive to light.  Pulmonary:     Effort: Pulmonary effort is normal. No respiratory distress.     Comments: Speaking in full sentences Musculoskeletal:        General: Normal range of motion.     Cervical back: Normal range of motion.  Skin:    Coloration: Skin is not jaundiced or pale.     Findings: No bruising, erythema, lesion or rash.  Neurological:     Mental Status: He is alert and oriented to person, place, and time. Mental status  is at baseline.  Psychiatric:        Mood and Affect: Mood normal.        Behavior: Behavior normal.        Thought Content: Thought content normal.        Judgment: Judgment normal.     Results for orders placed or performed in visit on 10/14/22  Comprehensive metabolic panel  Result Value Ref Range   Glucose 100 (H) 70 - 99 mg/dL   BUN 9 6 - 24 mg/dL   Creatinine, Ser 1.31 (H) 0.76 - 1.27 mg/dL   eGFR 70 >59 mL/min/1.73   BUN/Creatinine Ratio 7 (L) 9 - 20   Sodium 141 134 - 144 mmol/L   Potassium 3.5 3.5 - 5.2 mmol/L   Chloride 106 96 - 106 mmol/L   CO2 20 20 - 29 mmol/L    Calcium 9.0 8.7 - 10.2 mg/dL   Total Protein 6.8 6.0 - 8.5 g/dL   Albumin 4.2 4.1 - 5.1 g/dL   Globulin, Total 2.6 1.5 - 4.5 g/dL   Albumin/Globulin Ratio 1.6 1.2 - 2.2   Bilirubin Total 0.6 0.0 - 1.2 mg/dL   Alkaline Phosphatase 69 44 - 121 IU/L   AST 15 0 - 40 IU/L   ALT 22 0 - 44 IU/L  CBC with Differential/Platelet  Result Value Ref Range   WBC 8.2 3.4 - 10.8 x10E3/uL   RBC 5.43 4.14 - 5.80 x10E6/uL   Hemoglobin 15.2 13.0 - 17.7 g/dL   Hematocrit 47.9 37.5 - 51.0 %   MCV 88 79 - 97 fL   MCH 28.0 26.6 - 33.0 pg   MCHC 31.7 31.5 - 35.7 g/dL   RDW 12.5 11.6 - 15.4 %   Platelets 295 150 - 450 x10E3/uL   Neutrophils 65 Not Estab. %   Lymphs 27 Not Estab. %   Monocytes 6 Not Estab. %   Eos 1 Not Estab. %   Basos 0 Not Estab. %   Neutrophils Absolute 5.4 1.4 - 7.0 x10E3/uL   Lymphocytes Absolute 2.2 0.7 - 3.1 x10E3/uL   Monocytes Absolute 0.5 0.1 - 0.9 x10E3/uL   EOS (ABSOLUTE) 0.1 0.0 - 0.4 x10E3/uL   Basophils Absolute 0.0 0.0 - 0.2 x10E3/uL   Immature Granulocytes 1 Not Estab. %   Immature Grans (Abs) 0.0 0.0 - 0.1 x10E3/uL  Lipid Panel w/o Chol/HDL Ratio  Result Value Ref Range   Cholesterol, Total 178 100 - 199 mg/dL   Triglycerides 105 0 - 149 mg/dL   HDL 37 (L) >39 mg/dL   VLDL Cholesterol Cal 19 5 - 40 mg/dL   LDL Chol Calc (NIH) 122 (H) 0 - 99 mg/dL  TSH  Result Value Ref Range   TSH 2.380 0.450 - 4.500 uIU/mL  Urinalysis, Routine w reflex microscopic  Result Value Ref Range   Specific Gravity, UA 1.015 1.005 - 1.030   pH, UA 7.0 5.0 - 7.5   Color, UA Yellow Yellow   Appearance Ur Clear Clear   Leukocytes,UA Negative Negative   Protein,UA Negative Negative/Trace   Glucose, UA Negative Negative   Ketones, UA Negative Negative   RBC, UA Negative Negative   Bilirubin, UA Negative Negative   Urobilinogen, Ur 0.2 0.2 - 1.0 mg/dL   Nitrite, UA Negative Negative   Microscopic Examination Comment   Testosterone, free, total([facility])  Result Value Ref  Range   Testosterone 732 264 - 916 ng/dL   Testosterone, Free 18.8 6.8 - 21.5 pg/mL   Sex Hormone  Binding 23.4 16.5 - 55.9 nmol/L  PSA  Result Value Ref Range   Prostate Specific Ag, Serum 0.6 0.0 - 4.0 ng/mL      Assessment & Plan:   Problem List Items Addressed This Visit   None Visit Diagnoses     COVID-19    -  Primary   Will treat with molnopirovir and symptomatically with cough medicine. Call with any concerns or if not getting better. Discussed quarantine. Continue to monitor   Relevant Medications   molnupiravir EUA (LAGEVRIO) 200 mg CAPS capsule        Follow up plan: Return if symptoms worsen or fail to improve.   This visit was completed via video visit through MyChart due to the restrictions of the COVID-19 pandemic. All issues as above were discussed and addressed. Physical exam was done as above through visual confirmation on video through MyChart. If it was felt that the patient should be evaluated in the office, they were directed there. The patient verbally consented to this visit. Location of the patient: home Location of the provider: work Those involved with this call:  Provider: Park Liter, DO CMA: Irena Reichmann, [employer] Desk/Registration: [employer]  Time spent on call:  15 minutes with patient face to face via video conference. More than 50% of this time was spent in counseling and coordination of care. 23 minutes total spent in review of patient's record and preparation of their chart.

## 2023-01-17 ENCOUNTER — Ambulatory Visit: Payer: BC Managed Care – PPO | Admitting: Family Medicine

## 2023-02-07 ENCOUNTER — Other Ambulatory Visit: Payer: Self-pay | Admitting: Family Medicine

## 2023-02-09 ENCOUNTER — Encounter: Payer: Self-pay | Admitting: Family Medicine

## 2023-02-09 NOTE — Telephone Encounter (Signed)
I've never written that for him, it looks like he was getting that from a specialist

## 2023-02-10 ENCOUNTER — Telehealth: Payer: Self-pay | Admitting: Family Medicine

## 2023-02-10 ENCOUNTER — Encounter: Payer: Self-pay | Admitting: Family Medicine

## 2023-02-10 MED ORDER — ACETAZOLAMIDE 250 MG PO TABS: 1500.0000 mg | ORAL_TABLET | Freq: Two times a day (BID) | ORAL | 0 refills | Status: DC

## 2023-02-10 NOTE — Telephone Encounter (Signed)
Pt is calling in to follow up on a message her sent Dr. Wynetta Emery regarding if she could prescribe him a short supply of Diamox until he could get through to a specialist. Pt says he hadn't heard anything and was hoping to get this resolved before the weekend. Please advise.

## 2023-02-10 NOTE — Telephone Encounter (Signed)
Spoke with patient and he is asking if the prescription can be sent to [facility] as it is cheaper than [facility]. Please advise?

## 2023-02-25 ENCOUNTER — Other Ambulatory Visit: Payer: Self-pay | Admitting: Family Medicine

## 2023-02-26 ENCOUNTER — Other Ambulatory Visit: Payer: Self-pay | Admitting: Family Medicine

## 2023-02-27 NOTE — Telephone Encounter (Signed)
Requested Prescriptions  Refused Prescriptions Disp Refills   sertraline (ZOLOFT) 100 MG tablet [Pharmacy Med Name: SERTRALINE HCL 100 MG TABLET] 180 tablet 1    Sig: TAKE 2 TABLETS BY MOUTH EVERY DAY     Psychiatry:  Antidepressants - SSRI - sertraline Passed - 02/25/2023  8:51 AM      Passed - AST in normal range and within 360 days    AST  Date Value Ref Range Status  10/14/2022 15 0 - 40 IU/L Final         Passed - ALT in normal range and within 360 days    ALT  Date Value Ref Range Status  10/14/2022 22 0 - 44 IU/L Final         Passed - Completed PHQ-2 or PHQ-9 in the last 360 days      Passed - Valid encounter within last 6 months    Recent Outpatient Visits           3 months ago [place], Megan P, DO   4 months ago Routine general medical examination at a health care facility   [facility], [place], DO   7 months ago [place], Megan P, DO   8 months ago Factor V Leiden mutation [facility])   [place], [place], DO   1 year ago [place], [place], DO

## 2023-02-27 NOTE — Telephone Encounter (Signed)
Unable to refill per protocol, Rx request is too soon. Last refill 06/28/22 for 90 days and 3 refills.  Requested Prescriptions  Pending Prescriptions Disp Refills   omeprazole (PRILOSEC) 20 MG capsule [Pharmacy Med Name: OMEPRAZOLE DR 20 MG CAPSULE] 90 capsule 3    Sig: TAKE 1 CAPSULE BY MOUTH EVERY DAY     Gastroenterology: Proton Pump Inhibitors Passed - 02/26/2023  8:54 AM      Passed - Valid encounter within last 12 months    Recent Outpatient Visits           3 months ago [place], [place], DO   4 months ago Routine general medical examination at a health care facility   [facility], [place], DO   7 months ago [place], [place], DO   8 months ago Factor V Leiden mutation [facility])   [place], [place], DO   1 year ago [place], [place], [place]

## 2023-03-23 ENCOUNTER — Other Ambulatory Visit: Payer: Self-pay | Admitting: Family Medicine

## 2023-03-23 NOTE — Telephone Encounter (Signed)
Patient will need an office visit for further refills. Requested Prescriptions  Pending Prescriptions Disp Refills   sertraline (ZOLOFT) 100 MG tablet [Pharmacy Med Name: SERTRALINE HCL 100 MG TABLET] 180 tablet 0    Sig: TAKE 2 TABLETS BY MOUTH EVERY DAY     Psychiatry:  Antidepressants - SSRI - sertraline Passed - 03/23/2023  7:58 AM      Passed - AST in normal range and within 360 days    AST  Date Value Ref Range Status  10/14/2022 15 0 - 40 IU/L Final         Passed - ALT in normal range and within 360 days    ALT  Date Value Ref Range Status  10/14/2022 22 0 - 44 IU/L Final         Passed - Completed PHQ-2 or PHQ-9 in the last 360 days      Passed - Valid encounter within last 6 months    Recent Outpatient Visits           4 months ago COVID-19   [place] [facility] [place], Megan P, DO   5 months ago Routine general medical examination at a health care facility   [facility], [facility], DO   8 months ago Poison ivy dermatitis   [place] [facility] [place], Megan P, DO   8 months ago Factor V Leiden mutation [facility])   [place] [facility] [place], [place], DO   1 year ago Low testosterone   [place] [facility] [place], Megan P, DO               omeprazole (PRILOSEC) 20 MG capsule [Pharmacy Med Name: OMEPRAZOLE DR 20 MG CAPSULE] 90 capsule 3    Sig: TAKE 1 CAPSULE BY MOUTH EVERY DAY     Gastroenterology: Proton Pump Inhibitors Passed - 03/23/2023  7:58 AM      Passed - Valid encounter within last 12 months    Recent Outpatient Visits           4 months ago COVID-19   [place] [facility] [place], Megan P, DO   5 months ago Routine general medical examination at a health care facility   [facility], Megan P, DO   8 months ago Poison ivy dermatitis   [place] [facility] [place], Megan P, DO   8  months ago Factor V Leiden mutation [facility])   [place] [facility] [place], [place], DO   1 year ago Low testosterone   [place] [facility] [place], [place], DO

## 2023-03-23 NOTE — Telephone Encounter (Signed)
Requested by interface surescripts.  Requested Prescriptions  Pending Prescriptions Disp Refills   omeprazole (PRILOSEC) 20 MG capsule [Pharmacy Med Name: OMEPRAZOLE DR 20 MG CAPSULE] 90 capsule 1    Sig: TAKE 1 CAPSULE BY MOUTH EVERY DAY     Gastroenterology: Proton Pump Inhibitors Passed - 03/23/2023 11:38 AM      Passed - Valid encounter within last 12 months    Recent Outpatient Visits           4 months ago COVID-19   [place] [facility] [place], Megan P, DO   5 months ago Routine general medical examination at a health care facility   [facility], Megan P, DO   8 months ago Poison ivy dermatitis   [place] [facility] [place], Megan P, DO   8 months ago Factor V Leiden mutation [facility])   [place] [facility] [place], [place], DO   1 year ago Low testosterone   [place] [facility] [place], [place], [place]

## 2023-06-07 DIAGNOSIS — G932 Benign intracranial hypertension: Secondary | ICD-10-CM | POA: Diagnosis not present

## 2023-06-19 ENCOUNTER — Other Ambulatory Visit: Payer: Self-pay | Admitting: Family Medicine

## 2023-06-20 NOTE — Telephone Encounter (Signed)
Requested medications are due for refill today.  yes  Requested medications are on the active medications list.  yes  Last refill. 03/23/2023 3180 0 rf  Future visit scheduled.   no  Notes to clinic.  Unclear if pt is to be seen yearly or more often. Note form OV in 11/23: Follow up plan: NEXT PREVENTATIVE PHYSICAL DUE IN 1 YEAR. Return in about 3 months (around 01/14/2023).  Please advise.   Requested Prescriptions  Pending Prescriptions Disp Refills   sertraline (ZOLOFT) 100 MG tablet [Pharmacy Med Name: SERTRALINE HCL 100 MG TABLET] 180 tablet 0    Sig: TAKE 2 TABLETS BY MOUTH EVERY DAY     Psychiatry:  Antidepressants - SSRI - sertraline Failed - 06/19/2023  2:20 AM      Failed - Valid encounter within last 6 months    Recent Outpatient Visits           7 months ago COVID-19   [place] [facility] [place], Megan P, DO   8 months ago Routine general medical examination at a health care facility   [facility], Megan P, DO   11 months ago Poison ivy dermatitis   [place] [facility] [place], Megan P, DO   11 months ago Factor V Leiden mutation [facility])   [facility] [place], [place], DO   1 year ago Low testosterone   [place] [facility] [place], Megan P, DO              Passed - AST in normal range and within 360 days    AST  Date Value Ref Range Status  10/14/2022 15 0 - 40 IU/L Final         Passed - ALT in normal range and within 360 days    ALT  Date Value Ref Range Status  10/14/2022 22 0 - 44 IU/L Final         Passed - Completed PHQ-2 or PHQ-9 in the last 360 days

## 2023-06-20 NOTE — Telephone Encounter (Signed)
Patient is overdue for an appointment. Please call and schedule a follow up visit and then route to provider for refill.

## 2023-06-21 NOTE — Telephone Encounter (Signed)
Patient is overdue for an appointment. Please call to schedule and then route to provider for refill.  

## 2023-06-21 NOTE — Telephone Encounter (Signed)
Attempted to reach patient, LVM explaining that in order to get this prescription refilled he would need to schedule a follow up visit. Put in CRM.

## 2023-06-26 NOTE — Telephone Encounter (Signed)
Scheduled patient on 07/25/2023 @ 3:00 pm.

## 2023-07-23 ENCOUNTER — Other Ambulatory Visit: Payer: Self-pay | Admitting: Family Medicine

## 2023-07-25 ENCOUNTER — Ambulatory Visit: Payer: BLUE CROSS/BLUE SHIELD | Admitting: Family Medicine

## 2023-07-25 ENCOUNTER — Encounter: Payer: Self-pay | Admitting: Family Medicine

## 2023-07-25 VITALS — BP 161/80 | HR 66 | Wt 374.8 lb

## 2023-07-25 DIAGNOSIS — I10 Essential (primary) hypertension: Secondary | ICD-10-CM

## 2023-07-25 DIAGNOSIS — G932 Benign intracranial hypertension: Secondary | ICD-10-CM | POA: Diagnosis not present

## 2023-07-25 DIAGNOSIS — M7711 Lateral epicondylitis, right elbow: Secondary | ICD-10-CM

## 2023-07-25 DIAGNOSIS — D6851 Activated protein C resistance: Secondary | ICD-10-CM

## 2023-07-25 DIAGNOSIS — I2699 Other pulmonary embolism without acute cor pulmonale: Secondary | ICD-10-CM

## 2023-07-25 DIAGNOSIS — F419 Anxiety disorder, unspecified: Secondary | ICD-10-CM | POA: Diagnosis not present

## 2023-07-25 LAB — MICROALBUMIN, URINE WAIVED
Creatinine, Urine Waived: 300 mg/dL (ref 10–300)
Microalb, Ur Waived: 80 mg/L — ABNORMAL HIGH (ref 0–19)

## 2023-07-25 MED ORDER — ALPRAZOLAM 0.5 MG PO TABS: ORAL_TABLET | ORAL | 0 refills | Status: DC

## 2023-07-25 MED ORDER — APIXABAN 5 MG PO TABS: ORAL_TABLET | ORAL | 3 refills | Status: DC

## 2023-07-25 MED ORDER — SERTRALINE HCL 100 MG PO TABS: ORAL_TABLET | ORAL | 1 refills | Status: DC

## 2023-07-25 MED ORDER — OMEPRAZOLE 20 MG PO CPDR: 20.0000 mg | DELAYED_RELEASE_CAPSULE | Freq: Every day | ORAL | 1 refills | Status: DC

## 2023-07-25 MED ORDER — DICLOFENAC SODIUM 1 % EX GEL: 4.0000 g | Freq: Four times a day (QID) | CUTANEOUS | 4 refills | Status: DC

## 2023-07-25 MED ORDER — LISINOPRIL 10 MG PO TABS: 10.0000 mg | ORAL_TABLET | Freq: Every day | ORAL | 0 refills | Status: DC

## 2023-07-25 NOTE — Assessment & Plan Note (Signed)
Under good control on current regimen. Continue current regimen. Continue to monitor. Call with any concerns. Refills given.   

## 2023-07-25 NOTE — Telephone Encounter (Signed)
Requested medication (s) are due for refill today -yes  Requested medication (s) are on the active medication list -yes  Future visit scheduled -today  Last refill: 06/26/23 #60  Notes to clinic: last RF-courtesy- patient has appointment today-will send for review of request  Requested Prescriptions  Pending Prescriptions Disp Refills   sertraline (ZOLOFT) 100 MG tablet [Pharmacy Med Name: SERTRALINE HCL 100 MG TABLET] 180 tablet 1    Sig: TAKE 2 TABLETS BY MOUTH EVERY DAY     Psychiatry:  Antidepressants - SSRI - sertraline Failed - 07/23/2023  1:32 PM      Failed - Valid encounter within last 6 months    Recent Outpatient Visits           8 months ago COVID-19   [place] [facility] [place], Megan P, DO   9 months ago Routine general medical examination at a health care facility   [facility] [place], Megan P, DO   1 year ago Poison ivy dermatitis   [place] [facility] [place], [place], DO   1 year ago Factor V Leiden mutation [facility])   [place] [facility] [place], [place], DO   1 year ago Low testosterone   [place] [facility] [place], [place], DO       Future Appointments             Today Dorcas Carrow, DO [place] [facility], [facility]            Passed - AST in normal range and within 360 days    AST  Date Value Ref Range Status  10/14/2022 15 0 - 40 IU/L Final         Passed - ALT in normal range and within 360 days    ALT  Date Value Ref Range Status  10/14/2022 22 0 - 44 IU/L Final         Passed - Completed PHQ-2 or PHQ-9 in the last 360 days         Requested Prescriptions  Pending Prescriptions Disp Refills   sertraline (ZOLOFT) 100 MG tablet [Pharmacy Med Name: SERTRALINE HCL 100 MG TABLET] 180 tablet 1    Sig: TAKE 2 TABLETS BY MOUTH EVERY DAY     Psychiatry:  Antidepressants - SSRI - sertraline Failed - 07/23/2023  1:32 PM      Failed  - Valid encounter within last 6 months    Recent Outpatient Visits           8 months ago COVID-19   [place] [facility] [place], Megan P, DO   9 months ago Routine general medical examination at a health care facility   [facility] [place], Megan P, DO   1 year ago Poison ivy dermatitis   [place] [facility] [place], [place], DO   1 year ago Factor V Leiden mutation [facility])   [place] [facility] [place], [place], DO   1 year ago Low testosterone   [place] [facility] [place], [place], DO       Future Appointments             Today Dorcas Carrow, DO [place] [facility], [facility]            Passed - AST in normal range and within 360 days    AST  Date Value Ref  Range Status  10/14/2022 15 0 - 40 IU/L Final         Passed - ALT in normal range and within 360 days    ALT  Date Value Ref Range Status  10/14/2022 22 0 - 44 IU/L Final         Passed - Completed PHQ-2 or PHQ-9 in the last 360 days

## 2023-07-25 NOTE — Assessment & Plan Note (Signed)
Continue eliquis. Labs drawn today. Call with any concerns.

## 2023-07-25 NOTE — Assessment & Plan Note (Signed)
Continue to follow with neurology. Call with any concerns.

## 2023-07-25 NOTE — Assessment & Plan Note (Signed)
Will start him on lisinopril and recheck in 3 months. Labs drawn today. Call with any concerns.

## 2023-07-25 NOTE — Progress Notes (Signed)
BP (!) 161/80 (BP Location: Left Arm, Cuff Size: Normal)   Pulse 66   Wt (!) 374 lb 12.8 oz (170 kg)   SpO2 98%   BMI 49.45 kg/m    Subjective:    Patient ID: Shane Oneill, male    DOB: 20-Mar-1981, 42 y.o.   MRN: [id number]  HPI: Shane Oneill is a 42 y.o. male  Chief Complaint  Patient presents with   Elbow Pain    Patient says he has noticed some elbow pain for the past three weeks. Patient says he has tried not to use it as much and declines any medication. Patient says the pain and discomfort comes when he tries to straighten and grip anything.    Hypertension    Patient says he has been noticing an elevation in his blood pressure. Patient says his provider recently changed his DIAMOX prescription about a month ago, around the time his noticed his blood pressure elevated.    HYPERTENSION  Hypertension status: newly diagnosed Satisfied with current treatment? N/A Duration of hypertension:  a couple of months  BP monitoring frequency:  rarely BP range: 160s/90s BP medication side effects:  N/A Medication compliance: N/A Previous BP meds:none Aspirin: no Recurrent headaches: no Visual changes: no Palpitations: no Dyspnea: no Chest pain: no Lower extremity edema: no Dizzy/lightheaded: no  ANXIETY/DEPRESSION Duration: chronic Status:stable Anxious mood: no  Excessive worrying: no Irritability: no  Sweating: no Nausea: no Palpitations:no Hyperventilation: no Panic attacks: no Agoraphobia: no  Obscessions/compulsions: no Depressed mood: no    07/25/2023    3:10 PM 10/14/2022    8:50 AM 06/28/2022   10:28 AM 12/29/2021   10:19 AM 03/15/2021    2:52 PM  Depression screen PHQ 2/9  Decreased Interest 0 0 0 0 0  Down, Depressed, Hopeless 0 1 1 0 0  PHQ - 2 Score 0 1 1 0 0  Altered sleeping 0 0 0 0   Tired, decreased energy 1 0 2 1   Change in appetite 0 0 0 0   Feeling bad or failure about yourself  0 0 0 0   Trouble concentrating 0 0 0 0   Moving slowly or  fidgety/restless 0 0 0 0   Suicidal thoughts 0 0 0 0   PHQ-9 Score 1 1 3 1    Difficult doing work/chores Not difficult at all Not difficult at all Somewhat difficult Not difficult at all    Anhedonia: no Weight changes: no Insomnia: no   Hypersomnia: no Fatigue/loss of energy: yes Feelings of worthlessness: no Feelings of guilt: no Impaired concentration/indecisiveness: no Suicidal ideations: no  Crying spells: no Recent Stressors/Life Changes: no   Relationship problems: no   Family stress: no     Financial stress: no    Job stress: no    Recent death/loss: no  ARM PAIN Duration: about a month Location: Lateral R elbow Mechanism of injury:  throwing with son Onset: gradual Severity: moderate  Quality:  tender and sore Frequency: intermittent Radiation: no Aggravating factors: throwing  Alleviating factors: rest  Status: worse Treatments attempted: rest  Relief with NSAIDs?:  No NSAIDs Taken Swelling: no Redness: no  Warmth: no Trauma: no Chest pain: no  Shortness of breath: no  Fever: no Decreased sensation: no Paresthesias: no Weakness: no  Relevant past medical, surgical, family and social history reviewed and updated as indicated. Interim medical history since our last visit reviewed. Allergies and medications reviewed and updated.  Review of Systems  Constitutional: Negative.  Respiratory: Negative.    Cardiovascular: Negative.   Gastrointestinal: Negative.   Musculoskeletal:  Positive for myalgias. Negative for arthralgias, back pain, gait problem, joint swelling, neck pain and neck stiffness.  Skin: Negative.   Neurological: Negative.   Psychiatric/Behavioral: Negative.      Per HPI unless specifically indicated above     Objective:    BP (!) 161/80 (BP Location: Left Arm, Cuff Size: Normal)   Pulse 66   Wt (!) 374 lb 12.8 oz (170 kg)   SpO2 98%   BMI 49.45 kg/m   Wt Readings from Last 3 Encounters:  07/25/23 (!) 374 lb 12.8 oz (170 kg)   10/14/22 (!) 373 lb (169.2 kg)  06/28/22 (!) 360 lb 6.4 oz (163.5 kg)    Physical Exam Vitals and nursing note reviewed.  Constitutional:      General: He is not in acute distress.    Appearance: Normal appearance. He is obese. He is not ill-appearing, toxic-appearing or diaphoretic.  HENT:     Head: Normocephalic and atraumatic.     Right Ear: External ear normal.     Left Ear: External ear normal.     Nose: Nose normal.     Mouth/Throat:     Mouth: Mucous membranes are moist.     Pharynx: Oropharynx is clear.  Eyes:     General: No scleral icterus.       Right eye: No discharge.        Left eye: No discharge.     Extraocular Movements: Extraocular movements intact.     Conjunctiva/sclera: Conjunctivae normal.     Pupils: Pupils are equal, round, and reactive to light.  Cardiovascular:     Rate and Rhythm: Normal rate and regular rhythm.     Pulses: Normal pulses.     Heart sounds: Normal heart sounds. No murmur heard.    No friction rub. No gallop.  Pulmonary:     Effort: Pulmonary effort is normal. No respiratory distress.     Breath sounds: Normal breath sounds. No stridor. No wheezing, rhonchi or rales.  Chest:     Chest wall: No tenderness.  Musculoskeletal:        General: Normal range of motion.     Cervical back: Normal range of motion and neck supple.  Skin:    General: Skin is warm and dry.     Capillary Refill: Capillary refill takes less than 2 seconds.     Coloration: Skin is not jaundiced or pale.     Findings: No bruising, erythema, lesion or rash.  Neurological:     General: No focal deficit present.     Mental Status: He is alert and oriented to person, place, and time. Mental status is at baseline.  Psychiatric:        Mood and Affect: Mood normal.        Behavior: Behavior normal.        Thought Content: Thought content normal.        Judgment: Judgment normal.     Results for orders placed or performed in visit on 10/14/22  Comprehensive  metabolic panel  Result Value Ref Range   Glucose 100 (H) 70 - 99 mg/dL   BUN 9 6 - 24 mg/dL   Creatinine, Ser 1.61 (H) 0.76 - 1.27 mg/dL   eGFR 70 >09 UE/AVW/0.98   BUN/Creatinine Ratio 7 (L) 9 - 20   Sodium 141 134 - 144 mmol/L   Potassium 3.5 3.5 - 5.2 mmol/L  Chloride 106 96 - 106 mmol/L   CO2 20 20 - 29 mmol/L   Calcium 9.0 8.7 - 10.2 mg/dL   Total Protein 6.8 6.0 - 8.5 g/dL   Albumin 4.2 4.1 - 5.1 g/dL   Globulin, Total 2.6 1.5 - 4.5 g/dL   Albumin/Globulin Ratio 1.6 1.2 - 2.2   Bilirubin Total 0.6 0.0 - 1.2 mg/dL   Alkaline Phosphatase 69 44 - 121 IU/L   AST 15 0 - 40 IU/L   ALT 22 0 - 44 IU/L  CBC with Differential/Platelet  Result Value Ref Range   WBC 8.2 3.4 - 10.8 x10E3/uL   RBC 5.43 4.14 - 5.80 x10E6/uL   Hemoglobin 15.2 13.0 - 17.7 g/dL   Hematocrit 03.4 74.2 - 51.0 %   MCV 88 79 - 97 fL   MCH 28.0 26.6 - 33.0 pg   MCHC 31.7 31.5 - 35.7 g/dL   RDW 59.5 63.8 - 75.6 %   Platelets 295 150 - 450 x10E3/uL   Neutrophils 65 Not Estab. %   Lymphs 27 Not Estab. %   Monocytes 6 Not Estab. %   Eos 1 Not Estab. %   Basos 0 Not Estab. %   Neutrophils Absolute 5.4 1.4 - 7.0 x10E3/uL   Lymphocytes Absolute 2.2 0.7 - 3.1 x10E3/uL   Monocytes Absolute 0.5 0.1 - 0.9 x10E3/uL   EOS (ABSOLUTE) 0.1 0.0 - 0.4 x10E3/uL   Basophils Absolute 0.0 0.0 - 0.2 x10E3/uL   Immature Granulocytes 1 Not Estab. %   Immature Grans (Abs) 0.0 0.0 - 0.1 x10E3/uL  Lipid Panel w/o Chol/HDL Ratio  Result Value Ref Range   Cholesterol, Total 178 100 - 199 mg/dL   Triglycerides 433 0 - 149 mg/dL   HDL 37 (L) >29 mg/dL   VLDL Cholesterol Cal 19 5 - 40 mg/dL   LDL Chol Calc (NIH) 518 (H) 0 - 99 mg/dL  TSH  Result Value Ref Range   TSH 2.380 0.450 - 4.500 uIU/mL  Urinalysis, Routine w reflex microscopic  Result Value Ref Range   Specific Gravity, UA 1.015 1.005 - 1.030   pH, UA 7.0 5.0 - 7.5   Color, UA Yellow Yellow   Appearance Ur Clear Clear   Leukocytes,UA Negative Negative    Protein,UA Negative Negative/Trace   Glucose, UA Negative Negative   Ketones, UA Negative Negative   RBC, UA Negative Negative   Bilirubin, UA Negative Negative   Urobilinogen, Ur 0.2 0.2 - 1.0 mg/dL   Nitrite, UA Negative Negative   Microscopic Examination Comment   Testosterone, free, total([facility])  Result Value Ref Range   Testosterone 732 264 - 916 ng/dL   Testosterone, Free 84.1 6.8 - 21.5 pg/mL   Sex Hormone Binding 23.4 16.5 - 55.9 nmol/L  PSA  Result Value Ref Range   Prostate Specific Ag, Serum 0.6 0.0 - 4.0 ng/mL      Assessment & Plan:   Problem List Items Addressed This Visit       Cardiovascular and Mediastinum   Pulmonary emboli (HCC)    Continue eliquis. Labs drawn today. Call with any concerns.       Relevant Medications   acetaZOLAMIDE ER (DIAMOX) 500 MG capsule   apixaban (ELIQUIS) 5 MG TABS tablet   lisinopril (ZESTRIL) 10 MG tablet   Primary hypertension    Will start him on lisinopril and recheck in 3 months. Labs drawn today. Call with any concerns.       Relevant Medications   acetaZOLAMIDE ER (  DIAMOX) 500 MG capsule   apixaban (ELIQUIS) 5 MG TABS tablet   lisinopril (ZESTRIL) 10 MG tablet   Other Relevant Orders   Basic metabolic panel   Microalbumin, Urine Waived     Nervous and Auditory   IIH (idiopathic intracranial hypertension)    Continue to follow with neurology. Call with any concerns.         Hematopoietic and Hemostatic   Factor V Leiden mutation (HCC) - Primary    Continue eliquis. Labs drawn today. Call with any concerns.       Relevant Orders   CBC with Differential/Platelet     Other   Anxiety    Under good control on current regimen. Continue current regimen. Continue to monitor. Call with any concerns. Refills given.        Relevant Medications   sertraline (ZOLOFT) 100 MG tablet   ALPRAZolam (XANAX) 0.5 MG tablet   Other Visit Diagnoses     Lateral epicondylitis of right elbow       Will treat  with voltaren and stretches. If not getting better consider injection. Call with any concerns.        Follow up plan: Return in about 3 months (around 10/25/2023) for physical.

## 2023-07-26 LAB — CBC WITH DIFFERENTIAL/PLATELET
Basophils Absolute: 0 10*3/uL (ref 0.0–0.2)
Basos: 0 %
EOS (ABSOLUTE): 0.1 10*3/uL (ref 0.0–0.4)
Eos: 2 %
Hematocrit: 41.7 % (ref 37.5–51.0)
Hemoglobin: 13.8 g/dL (ref 13.0–17.7)
Immature Grans (Abs): 0 10*3/uL (ref 0.0–0.1)
Immature Granulocytes: 1 %
Lymphocytes Absolute: 2.7 10*3/uL (ref 0.7–3.1)
Lymphs: 33 %
MCH: 29.1 pg (ref 26.6–33.0)
MCHC: 33.1 g/dL (ref 31.5–35.7)
MCV: 88 fL (ref 79–97)
Monocytes Absolute: 0.5 10*3/uL (ref 0.1–0.9)
Monocytes: 6 %
Neutrophils Absolute: 4.8 10*3/uL (ref 1.4–7.0)
Neutrophils: 58 %
Platelets: 266 10*3/uL (ref 150–450)
RBC: 4.75 x10E6/uL (ref 4.14–5.80)
RDW: 13 % (ref 11.6–15.4)
WBC: 8.1 10*3/uL (ref 3.4–10.8)

## 2023-07-26 LAB — BASIC METABOLIC PANEL
BUN/Creatinine Ratio: 8 — ABNORMAL LOW (ref 9–20)
BUN: 10 mg/dL (ref 6–24)
CO2: 23 mmol/L (ref 20–29)
Calcium: 8.8 mg/dL (ref 8.7–10.2)
Chloride: 106 mmol/L (ref 96–106)
Creatinine, Ser: 1.24 mg/dL (ref 0.76–1.27)
Glucose: 85 mg/dL (ref 70–99)
Potassium: 3.6 mmol/L (ref 3.5–5.2)
Sodium: 142 mmol/L (ref 134–144)
eGFR: 74 mL/min/{1.73_m2} (ref >59)

## 2023-09-24 ENCOUNTER — Other Ambulatory Visit: Payer: Self-pay | Admitting: Family Medicine

## 2023-09-26 ENCOUNTER — Other Ambulatory Visit: Payer: Self-pay | Admitting: Family Medicine

## 2023-09-26 NOTE — Telephone Encounter (Signed)
Requested Prescriptions  Refused Prescriptions Disp Refills   omeprazole (PRILOSEC) 20 MG capsule [Pharmacy Med Name: OMEPRAZOLE DR 20 MG CAPSULE] 90 capsule 1    Sig: TAKE 1 CAPSULE BY MOUTH EVERY DAY     Gastroenterology: Proton Pump Inhibitors Passed - 09/24/2023  9:14 AM      Passed - Valid encounter within last 12 months    Recent Outpatient Visits           2 months ago Factor V Leiden mutation [facility])   [place] [facility] [place], Megan P, DO   10 months ago COVID-19   [place] [facility] [place], [place], DO   11 months ago Routine general medical examination at a health care facility   [facility] [place], Megan P, DO   1 year ago Poison ivy dermatitis   [place] [facility] [place], [place], DO   1 year ago Factor V Leiden mutation [facility])   [address] [address] [facility] [place], Delia, DO       Future Appointments             In 1 month Johnson, Oralia Rud, DO [place] [facility], [facility]

## 2023-09-27 NOTE — Telephone Encounter (Signed)
Refilling to another pharmacy.  Requested Prescriptions  Pending Prescriptions Disp Refills   sertraline (ZOLOFT) 100 MG tablet [Pharmacy Med Name: SERTRALINE HCL 100 MG TABLET] 180 tablet 0    Sig: TAKE 2 TABLETS BY MOUTH EVERY DAY     Psychiatry:  Antidepressants - SSRI - sertraline Passed - 09/26/2023  6:19 PM      Passed - AST in normal range and within 360 days    AST  Date Value Ref Range Status  10/14/2022 15 0 - 40 IU/L Final         Passed - ALT in normal range and within 360 days    ALT  Date Value Ref Range Status  10/14/2022 22 0 - 44 IU/L Final         Passed - Completed PHQ-2 or PHQ-9 in the last 360 days      Passed - Valid encounter within last 6 months    Recent Outpatient Visits           2 months ago Factor V Leiden mutation (HCC)   [place] [facility] [place], Megan P, DO   10 months ago COVID-19   [place] [facility] [place], Megan P, DO   11 months ago Routine general medical examination at a health care facility   [facility] [place], Megan P, DO   1 year ago Poison ivy dermatitis   [place] [facility] [place], [place], DO   1 year ago Factor V Leiden mutation [facility])   [place] [facility] [place], [place], DO       Future Appointments             In 1 month Johnson, Megan P, DO [place] [facility], [facility]             omeprazole (PRILOSEC) 20 MG capsule [Pharmacy Med Name: OMEPRAZOLE DR 20 MG CAPSULE] 90 capsule 0    Sig: TAKE 1 CAPSULE BY MOUTH EVERY DAY     Gastroenterology: Proton Pump Inhibitors Passed - 09/26/2023  6:19 PM      Passed - Valid encounter within last 12 months    Recent Outpatient Visits           2 months ago Factor V Leiden mutation [facility]   [place] [facility] [place], Megan P, DO   10 months ago COVID-19   [place] [facility] [place], Megan P, DO   11 months ago Routine  general medical examination at a health care facility   [facility] [place], Megan P, DO   1 year ago Poison ivy dermatitis   [place] [facility] [place], [place], DO   1 year ago Factor V Leiden mutation [facility])   [place] [facility] [place], [place], DO       Future Appointments             In 1 month Johnson, Oralia Rud, DO [place] [facility], [facility]

## 2023-10-22 ENCOUNTER — Other Ambulatory Visit: Payer: Self-pay | Admitting: Family Medicine

## 2023-10-24 ENCOUNTER — Other Ambulatory Visit: Payer: Self-pay | Admitting: Family Medicine

## 2023-10-24 NOTE — Telephone Encounter (Signed)
Requested Prescriptions  Pending Prescriptions Disp Refills   lisinopril (ZESTRIL) 10 MG tablet [Pharmacy Med Name: LISINOPRIL 10MG  TABLETS] 90 tablet 0    Sig: TAKE 1 TABLET(10 MG) BY MOUTH DAILY     Cardiovascular:  ACE Inhibitors Failed - 10/22/2023  2:25 PM      Failed - Last BP in normal range    BP Readings from Last 1 Encounters:  07/25/23 (!) 161/80         Passed - Cr in normal range and within 180 days    Creatinine, Ser  Date Value Ref Range Status  07/25/2023 1.24 0.76 - 1.27 mg/dL Final         Passed - K in normal range and within 180 days    Potassium  Date Value Ref Range Status  07/25/2023 3.6 3.5 - 5.2 mmol/L Final         Passed - Patient is not pregnant      Passed - Valid encounter within last 6 months    Recent Outpatient Visits           3 months ago Factor V Leiden mutation [facility])   [place] [facility] [place], [place], DO   11 months ago COVID-19   [place] [facility] [place], Megan P, DO   1 year ago Routine general medical examination at a health care facility   [facility] [place], Megan P, DO   1 year ago Poison ivy dermatitis   [place] [facility] [place], [place], DO   1 year ago Factor V Leiden mutation [facility])   [place] [facility] [place], [place], DO       Future Appointments             In 1 week Dorcas Carrow, DO  [facility], [facility]

## 2023-10-25 NOTE — Telephone Encounter (Signed)
Last RF 10/21/23 #90   Requested Prescriptions  Refused Prescriptions Disp Refills   lisinopril (ZESTRIL) 10 MG tablet [Pharmacy Med Name: LISINOPRIL 10MG  TABLETS] 90 tablet 0    Sig: TAKE 1 TABLET(10 MG) BY MOUTH DAILY     Cardiovascular:  ACE Inhibitors Failed - 10/24/2023  2:33 PM      Failed - Last BP in normal range    BP Readings from Last 1 Encounters:  07/25/23 (!) 161/80         Passed - Cr in normal range and within 180 days    Creatinine, Ser  Date Value Ref Range Status  07/25/2023 1.24 0.76 - 1.27 mg/dL Final         Passed - K in normal range and within 180 days    Potassium  Date Value Ref Range Status  07/25/2023 3.6 3.5 - 5.2 mmol/L Final         Passed - Patient is not pregnant      Passed - Valid encounter within last 6 months    Recent Outpatient Visits           3 months ago Factor V Leiden mutation [facility])   [place] [facility] [place], [place], DO   11 months ago COVID-19   [place] [facility] [place], Megan P, DO   1 year ago Routine general medical examination at a health care facility   [facility] [place], Megan P, DO   1 year ago Poison ivy dermatitis   [place] [facility] [place], [place], DO   1 year ago Factor V Leiden mutation [facility])   [place] [facility] [place], [place], DO       Future Appointments             In 6 days Dorcas Carrow, DO  [facility], [facility]

## 2023-10-31 ENCOUNTER — Encounter: Payer: Self-pay | Admitting: Family Medicine

## 2023-11-30 ENCOUNTER — Encounter: Payer: Self-pay | Admitting: Family Medicine

## 2023-11-30 ENCOUNTER — Ambulatory Visit (INDEPENDENT_AMBULATORY_CARE_PROVIDER_SITE_OTHER): Payer: 59 | Admitting: Family Medicine

## 2023-11-30 VITALS — BP 138/86 | HR 84 | Ht 72.0 in | Wt 366.8 lb

## 2023-11-30 DIAGNOSIS — F419 Anxiety disorder, unspecified: Secondary | ICD-10-CM

## 2023-11-30 DIAGNOSIS — I2699 Other pulmonary embolism without acute cor pulmonale: Secondary | ICD-10-CM

## 2023-11-30 DIAGNOSIS — D6851 Activated protein C resistance: Secondary | ICD-10-CM | POA: Diagnosis not present

## 2023-11-30 DIAGNOSIS — I10 Essential (primary) hypertension: Secondary | ICD-10-CM

## 2023-11-30 DIAGNOSIS — Z79899 Other long term (current) drug therapy: Secondary | ICD-10-CM | POA: Diagnosis not present

## 2023-11-30 DIAGNOSIS — Z0000 Encounter for general adult medical examination without abnormal findings: Secondary | ICD-10-CM | POA: Diagnosis not present

## 2023-11-30 DIAGNOSIS — Z23 Encounter for immunization: Secondary | ICD-10-CM

## 2023-11-30 DIAGNOSIS — G4733 Obstructive sleep apnea (adult) (pediatric): Secondary | ICD-10-CM | POA: Diagnosis not present

## 2023-11-30 DIAGNOSIS — R7989 Other specified abnormal findings of blood chemistry: Secondary | ICD-10-CM

## 2023-11-30 MED ORDER — CARIPRAZINE HCL 1.5 MG PO CAPS: 1.5000 mg | ORAL_CAPSULE | ORAL | 3 refills | Status: DC

## 2023-11-30 MED ORDER — OMEPRAZOLE 20 MG PO CPDR: 20.0000 mg | DELAYED_RELEASE_CAPSULE | Freq: Every day | ORAL | 1 refills | Status: DC

## 2023-11-30 MED ORDER — ALPRAZOLAM 0.5 MG PO TABS: ORAL_TABLET | ORAL | 0 refills | Status: DC

## 2023-11-30 MED ORDER — DICLOFENAC SODIUM 1 % EX GEL: 4.0000 g | Freq: Four times a day (QID) | CUTANEOUS | 4 refills | Status: DC

## 2023-11-30 MED ORDER — LISINOPRIL 10 MG PO TABS: 10.0000 mg | ORAL_TABLET | Freq: Every day | ORAL | 1 refills | Status: DC

## 2023-11-30 MED ORDER — SERTRALINE HCL 100 MG PO TABS: ORAL_TABLET | ORAL | 1 refills | Status: DC

## 2023-11-30 MED ORDER — APIXABAN 5 MG PO TABS: ORAL_TABLET | ORAL | 3 refills | Status: DC

## 2023-11-30 NOTE — Progress Notes (Signed)
 BP 138/86   Pulse 84   Ht 6' (1.829 m)   Wt (!) 366 lb 12.8 oz (166.4 kg)   SpO2 98%   BMI 49.75 kg/m    Subjective:    Patient ID: Shane Oneill, male    DOB: 10/18/1981, 43 y.o.   MRN: [id number]  HPI: Shane Oneill is a 43 y.o. male presenting on 11/30/2023 for comprehensive medical examination. Current medical complaints include:  HYPERTENSION  Hypertension status: controlled  Satisfied with current treatment? yes Duration of hypertension: chronic BP monitoring frequency:  not checking BP range:  BP medication side effects:  no Medication compliance: excellent compliance Previous BP meds:lisinopril  Aspirin: no Recurrent headaches: no Visual changes: no Palpitations: no Dyspnea: no Chest pain: no Lower extremity edema: no Dizzy/lightheaded: no  ANXIETY/DEPRESSION Duration: chronic Status:exacerbated Anxious mood: yes  Excessive worrying: yes Irritability: yes  Sweating: no Nausea: no Palpitations:no Hyperventilation: no Panic attacks: no Agoraphobia: no  Obscessions/compulsions: no Depressed mood: yes    12/04/2023   10:00 AM 07/25/2023    3:10 PM 10/14/2022    8:50 AM 06/28/2022   10:28 AM 12/29/2021   10:19 AM  Depression screen PHQ 2/9  Decreased Interest 0 0 0 0 0  Down, Depressed, Hopeless 1 0 1 1 0  PHQ - 2 Score 1 0 1 1 0  Altered sleeping 2 0 0 0 0  Tired, decreased energy 2 1 0 2 1  Change in appetite 2 0 0 0 0  Feeling bad or failure about yourself  0 0 0 0 0  Trouble concentrating 0 0 0 0 0  Moving slowly or fidgety/restless 0 0 0 0 0  Suicidal thoughts 0 0 0 0 0  PHQ-9 Score 7 1 1 3 1   Difficult doing work/chores Somewhat difficult Not difficult at all Not difficult at all Somewhat difficult Not difficult at all      12/04/2023   10:00 AM 07/25/2023    3:10 PM 10/14/2022    8:50 AM 06/28/2022   10:29 AM  GAD 7 : Generalized Anxiety Score  Nervous, Anxious, on Edge 1 1 0 1  Control/stop worrying 0 0 0 0  Worry too much - different things 1 0  1 1  Trouble relaxing 1 0 0 0  Restless 0 0 0 0  Easily annoyed or irritable 2 0 0 1  Afraid - awful might happen 0 0 0 0  Total GAD 7 Score 5 1 1 3   Anxiety Difficulty Somewhat difficult Not difficult at all Not difficult at all Somewhat difficult   Anhedonia: no Weight changes: no Insomnia: yes   Hypersomnia: no Fatigue/loss of energy: yes Feelings of worthlessness: no Feelings of guilt: no Impaired concentration/indecisiveness: no Suicidal ideations: no  Crying spells: no Recent Stressors/Life Changes: yes   Relationship problems: yes   Family stress: yes     Financial stress: no    Job stress: yes    Recent death/loss: no  LOW TESTOSTERONE  Duration: chronic Status: uncontrolled  Satisfied with current treatment:  no Previous testosterone  therapies: androgel  Medication side effects:  no Medication compliance: poor compliance Decreased libido: yes Fatigue: yes Depressed mood: yes Muscle weakness: yes Erectile dysfunction: no  SLEEP APNEA Sleep apnea status: uncontrolled Duration: chronic Satisfied with current treatment?:  no CPAP use:  no Sleep quality with CPAP use:  N/A Treament compliance: Never got a CPAP Last sleep study: about 5 years ago Treatments attempted: none Wakes feeling refreshed:  no Daytime hypersomnolence:  yes  Fatigue:  yes Insomnia:  no Good sleep hygiene:  yes Difficulty falling asleep:  no Difficulty staying asleep:  yes Snoring bothers bed partner:  yes Observed apnea by bed partner: yes Obesity:  yes Hypertension: yes  Pulmonary hypertension:  no Coronary artery disease:  yes  Interim Problems from his last visit: no  Depression Screen done today and results listed below:     12/04/2023   10:00 AM 07/25/2023    3:10 PM 10/14/2022    8:50 AM 06/28/2022   10:28 AM 12/29/2021   10:19 AM  Depression screen PHQ 2/9  Decreased Interest 0 0 0 0 0  Down, Depressed, Hopeless 1 0 1 1 0  PHQ - 2 Score 1 0 1 1 0  Altered sleeping 2 0  0 0 0  Tired, decreased energy 2 1 0 2 1  Change in appetite 2 0 0 0 0  Feeling bad or failure about yourself  0 0 0 0 0  Trouble concentrating 0 0 0 0 0  Moving slowly or fidgety/restless 0 0 0 0 0  Suicidal thoughts 0 0 0 0 0  PHQ-9 Score 7 1 1 3 1   Difficult doing work/chores Somewhat difficult Not difficult at all Not difficult at all Somewhat difficult Not difficult at all    Past Medical History:  Past Medical History:  Diagnosis Date   Anxiety    Blood clot in vein    Idiopathic intracranial hypertension    Sleep apnea     Surgical History:  Past Surgical History:  Procedure Laterality Date   club feet      Medications:  Current Outpatient Medications on File Prior to Visit  Medication Sig   acetaZOLAMIDE  ER (DIAMOX ) 500 MG capsule Take by mouth.   testosterone  (ANDROGEL ) 50 MG/5GM (1%) GEL Place 5 g onto the skin daily.   No current facility-administered medications on file prior to visit.    Allergies:  Allergies  Allergen Reactions   Cefoxitin Hives    Social History:  Social History   Socioeconomic History   Marital status: Single    Spouse name: Not on file   Number of children: Not on file   Years of education: Not on file   Highest education level: Bachelor's degree (e.g., BA, AB, BS)  Occupational History   Not on file  Tobacco Use   Smoking status: Former    Current packs/day: 0.00    Average packs/day: 0.5 packs/day for 10.0 years (5.0 ttl pk-yrs)    Types: Cigarettes    Start date: 11/27/2005    Quit date: 11/28/2015    Years since quitting: 8.0   Smokeless tobacco: Never  Vaping Use   Vaping status: Never Used  Substance and Sexual Activity   Alcohol use: Not Currently   Drug use: Never   Sexual activity: Yes  Other Topics Concern   Not on file  Social History Narrative   Not on file   Social Drivers of Health   Financial Resource Strain: Low Risk  (07/24/2023)   Overall Financial Resource Strain (CARDIA)    Difficulty of  Paying Living Expenses: Not hard at all  Food Insecurity: No Food Insecurity (07/24/2023)   Hunger Vital Sign    Worried About Running Out of Food in the Last Year: Never true    Ran Out of Food in the Last Year: Never true  Transportation Needs: No Transportation Needs (07/24/2023)   PRAPARE - Administrator, Civil Service (Medical):  No    Lack of Transportation (Non-Medical): No  Physical Activity: Insufficiently Active (07/24/2023)   Exercise Vital Sign    Days of Exercise per Week: 1 day    Minutes of Exercise per Session: 30 min  Stress: No Stress Concern Present (07/24/2023)   [employer] of Occupational Health - Occupational Stress Questionnaire    Feeling of Stress : Only a little  Social Connections: Moderately Integrated (07/24/2023)   Social Connection and Isolation Panel [NHANES]    Frequency of Communication with Friends and Family: Twice a week    Frequency of Social Gatherings with Friends and Family: Once a week    Attends Religious Services: More than 4 times per year    Active Member of [employer] or Organizations: Yes    Attends Engineer, Structural: More than 4 times per year    Marital Status: Never married  Catering Manager Violence: Not on file   Social History   Tobacco Use  Smoking Status Former   Current packs/day: 0.00   Average packs/day: 0.5 packs/day for 10.0 years (5.0 ttl pk-yrs)   Types: Cigarettes   Start date: 11/27/2005   Quit date: 11/28/2015   Years since quitting: 8.0  Smokeless Tobacco Never   Social History   Substance and Sexual Activity  Alcohol Use Not Currently    Family History:  Family History  Problem Relation Age of Onset   Hypertension Mother    Healthy Father    Cancer Maternal Aunt     Past medical history, surgical history, medications, allergies, family history and social history reviewed with patient today and changes made to appropriate areas of the chart.   Review of Systems   Constitutional:  Positive for malaise/fatigue. Negative for chills, diaphoresis, fever and weight loss.  HENT:  Positive for congestion. Negative for ear discharge, ear pain, hearing loss, nosebleeds, sinus pain, sore throat and tinnitus.   Eyes:  Positive for blurred vision. Negative for double vision, photophobia, pain, discharge and redness.  Respiratory: Negative.  Negative for stridor.   Cardiovascular: Negative.   Gastrointestinal: Negative.   Genitourinary: Negative.   Musculoskeletal: Negative.   Skin: Negative.   Neurological: Negative.   Endo/Heme/Allergies: Negative.   Psychiatric/Behavioral:  Positive for depression. Negative for hallucinations, memory loss, substance abuse and suicidal ideas. The patient is nervous/anxious. The patient does not have insomnia.    All other ROS negative except what is listed above and in the HPI.      Objective:    BP 138/86   Pulse 84   Ht 6' (1.829 m)   Wt (!) 366 lb 12.8 oz (166.4 kg)   SpO2 98%   BMI 49.75 kg/m   Wt Readings from Last 3 Encounters:  11/30/23 (!) 366 lb 12.8 oz (166.4 kg)  07/25/23 (!) 374 lb 12.8 oz (170 kg)  10/14/22 (!) 373 lb (169.2 kg)    Physical Exam Vitals and nursing note reviewed.  Constitutional:      General: He is not in acute distress.    Appearance: Normal appearance. He is obese. He is not ill-appearing, toxic-appearing or diaphoretic.  HENT:     Head: Normocephalic and atraumatic.     Right Ear: Tympanic membrane, ear canal and external ear normal. There is no impacted cerumen.     Left Ear: Tympanic membrane, ear canal and external ear normal. There is no impacted cerumen.     Nose: Nose normal. No congestion or rhinorrhea.     Mouth/Throat:  Mouth: Mucous membranes are moist.     Pharynx: Oropharynx is clear. No oropharyngeal exudate or posterior oropharyngeal erythema.  Eyes:     General: No scleral icterus.       Right eye: No discharge.        Left eye: No discharge.      Extraocular Movements: Extraocular movements intact.     Conjunctiva/sclera: Conjunctivae normal.     Pupils: Pupils are equal, round, and reactive to light.  Neck:     Vascular: No carotid bruit.  Cardiovascular:     Rate and Rhythm: Normal rate and regular rhythm.     Pulses: Normal pulses.     Heart sounds: No murmur heard.    No friction rub. No gallop.  Pulmonary:     Effort: Pulmonary effort is normal. No respiratory distress.     Breath sounds: Normal breath sounds. No stridor. No wheezing, rhonchi or rales.  Chest:     Chest wall: No tenderness.  Abdominal:     General: Abdomen is flat. Bowel sounds are normal. There is no distension.     Palpations: Abdomen is soft. There is no mass.     Tenderness: There is no abdominal tenderness. There is no right CVA tenderness, left CVA tenderness, guarding or rebound.     Hernia: No hernia is present.  Genitourinary:    Comments: Genital exam deferred with shared decision making Musculoskeletal:        General: No swelling, tenderness, deformity or signs of injury.     Cervical back: Normal range of motion and neck supple. No rigidity. No muscular tenderness.     Right lower leg: No edema.     Left lower leg: No edema.  Lymphadenopathy:     Cervical: No cervical adenopathy.  Skin:    General: Skin is warm and dry.     Capillary Refill: Capillary refill takes less than 2 seconds.     Coloration: Skin is not jaundiced or pale.     Findings: No bruising, erythema, lesion or rash.  Neurological:     General: No focal deficit present.     Mental Status: He is alert and oriented to person, place, and time.     Cranial Nerves: No cranial nerve deficit.     Sensory: No sensory deficit.     Motor: No weakness.     Coordination: Coordination normal.     Gait: Gait normal.     Deep Tendon Reflexes: Reflexes normal.  Psychiatric:        Mood and Affect: Mood normal.        Behavior: Behavior normal.        Thought Content: Thought  content normal.        Judgment: Judgment normal.     Results for orders placed or performed in visit on 07/25/23  Microalbumin, Urine Waived   Collection Time: 07/25/23  3:24 PM  Result Value Ref Range   Microalb, Ur Waived 80 (H) 0 - 19 mg/L   Creatinine, Urine Waived 300 10 - 300 mg/dL   Microalb/Creat Ratio 30-300 (H) <30 mg/g  CBC with Differential/Platelet   Collection Time: 07/25/23  3:26 PM  Result Value Ref Range   WBC 8.1 3.4 - 10.8 x10E3/uL   RBC 4.75 4.14 - 5.80 x10E6/uL   Hemoglobin 13.8 13.0 - 17.7 g/dL   Hematocrit 58.2 62.4 - 51.0 %   MCV 88 79 - 97 fL   MCH 29.1 26.6 - 33.0 pg  MCHC 33.1 31.5 - 35.7 g/dL   RDW 86.9 88.3 - 84.5 %   Platelets 266 150 - 450 x10E3/uL   Neutrophils 58 Not Estab. %   Lymphs 33 Not Estab. %   Monocytes 6 Not Estab. %   Eos 2 Not Estab. %   Basos 0 Not Estab. %   Neutrophils Absolute 4.8 1.4 - 7.0 x10E3/uL   Lymphocytes Absolute 2.7 0.7 - 3.1 x10E3/uL   Monocytes Absolute 0.5 0.1 - 0.9 x10E3/uL   EOS (ABSOLUTE) 0.1 0.0 - 0.4 x10E3/uL   Basophils Absolute 0.0 0.0 - 0.2 x10E3/uL   Immature Granulocytes 1 Not Estab. %   Immature Grans (Abs) 0.0 0.0 - 0.1 x10E3/uL  Basic metabolic panel   Collection Time: 07/25/23  3:26 PM  Result Value Ref Range   Glucose 85 70 - 99 mg/dL   BUN 10 6 - 24 mg/dL   Creatinine, Ser 8.75 0.76 - 1.27 mg/dL   eGFR 74 >40 fO/fpw/8.26   BUN/Creatinine Ratio 8 (L) 9 - 20   Sodium 142 134 - 144 mmol/L   Potassium 3.6 3.5 - 5.2 mmol/L   Chloride 106 96 - 106 mmol/L   CO2 23 20 - 29 mmol/L   Calcium 8.8 8.7 - 10.2 mg/dL      Assessment & Plan:   Problem List Items Addressed This Visit       Cardiovascular and Mediastinum   Pulmonary emboli (HCC)   Under good control on current regimen. Continue current regimen. Continue to monitor. Call with any concerns. Refills given. Labs drawn today.        Relevant Medications   apixaban  (ELIQUIS ) 5 MG TABS tablet   lisinopril  (ZESTRIL ) 10 MG tablet    Primary hypertension   Under good control on current regimen. Continue current regimen. Continue to monitor. Call with any concerns. Refills given. Labs drawn today.       Relevant Medications   apixaban  (ELIQUIS ) 5 MG TABS tablet   lisinopril  (ZESTRIL ) 10 MG tablet   Other Relevant Orders   TSH (Completed)   Microalbumin, Urine Waived (Completed)     Respiratory   OSA (obstructive sleep apnea)   Never got his CPAP- will get him set up for split night. Await results.       Relevant Orders   Ambulatory referral to Sleep Studies     Hematopoietic and Hemostatic   Factor V Leiden mutation (HCC)   Under good control on current regimen. Continue current regimen. Continue to monitor. Call with any concerns. Refills given. Labs drawn today.         Other   Anxiety   Not doing well. Will continue sertraline  and add vraylar . PRN alprazolam . Call with any concerns. Recheck 1 month.       Relevant Medications   ALPRAZolam  (XANAX ) 0.5 MG tablet   sertraline  (ZOLOFT ) 100 MG tablet   Other Relevant Orders   235116 11+Oxyco+Alc+Crt-Bund (Completed)   Controlled substance agreement signed   Relevant Orders   235116 11+Oxyco+Alc+Crt-Bund (Completed)   Low testosterone    Rechecking labs today. Await results. Treat as needed.       Relevant Orders   PSA (Completed)   Testosterone , free, total([facility]) (Completed)   Obesity, morbid (HCC)   Encouraged diet and exercise with goal of losing 1-2lbs per week. Call with any concerns.       Other Visit Diagnoses       Routine general medical examination at a health care facility    -  Primary   Vaccines up to date/declined. Screening labs checked today. Continue diet and exercise. Call with any concerns. Continue to monitor.   Relevant Orders   Comprehensive metabolic panel (Completed)   CBC with Differential/Platelet (Completed)   Lipid Panel w/o Chol/HDL Ratio (Completed)     Needs flu shot       Flu shot given today.    Relevant Orders   Flu vaccine trivalent PF, 6mos and older(Flulaval,Afluria,Fluarix,Fluzone) (Completed)        LABORATORY TESTING:  Health maintenance labs ordered today as discussed above.   The natural history of prostate cancer and ongoing controversy regarding screening and potential treatment outcomes of prostate cancer has been discussed with the patient. The meaning of a false positive PSA and a false negative PSA has been discussed. He indicates understanding of the limitations of this screening test and wishes to proceed with screening PSA testing.   IMMUNIZATIONS:   - Tdap: Tetanus vaccination status reviewed: last tetanus booster within 10 years. - Influenza: Up to date - Pneumovax: Not applicable - Prevnar: Not applicable - COVID: Refused - HPV: Not applicable - Shingrix vaccine: Not applicable  PATIENT COUNSELING:    Sexuality: Discussed sexually transmitted diseases, partner selection, use of condoms, avoidance of unintended pregnancy  and contraceptive alternatives.   Advised to avoid cigarette smoking.  I discussed with the patient that most people either abstain from alcohol or drink within safe limits (<=14/week and <=4 drinks/occasion for males, <=7/weeks and <= 3 drinks/occasion for females) and that the risk for alcohol disorders and other health effects rises proportionally with the number of drinks per week and how often a drinker exceeds daily limits.  Discussed cessation/primary prevention of drug use and availability of treatment for abuse.   Diet: Encouraged to adjust caloric intake to maintain  or achieve ideal body weight, to reduce intake of dietary saturated fat and total fat, to limit sodium intake by avoiding high sodium foods and not adding table salt, and to maintain adequate dietary potassium and calcium preferably from fresh fruits, vegetables, and low-fat dairy products.    stressed the importance of regular exercise  Injury prevention:  Discussed safety belts, safety helmets, smoke detector, smoking near bedding or upholstery.   Dental health: Discussed importance of regular tooth brushing, flossing, and dental visits.   Follow up plan: NEXT PREVENTATIVE PHYSICAL DUE IN 1 YEAR. Return in about 4 weeks (around 12/28/2023).

## 2023-11-30 NOTE — Patient Instructions (Signed)
 [email or web address]

## 2023-12-01 ENCOUNTER — Other Ambulatory Visit: Payer: 59

## 2023-12-01 DIAGNOSIS — I10 Essential (primary) hypertension: Secondary | ICD-10-CM

## 2023-12-01 DIAGNOSIS — Z79899 Other long term (current) drug therapy: Secondary | ICD-10-CM | POA: Diagnosis not present

## 2023-12-01 DIAGNOSIS — Z0000 Encounter for general adult medical examination without abnormal findings: Secondary | ICD-10-CM | POA: Diagnosis not present

## 2023-12-01 DIAGNOSIS — F419 Anxiety disorder, unspecified: Secondary | ICD-10-CM

## 2023-12-01 DIAGNOSIS — R7989 Other specified abnormal findings of blood chemistry: Secondary | ICD-10-CM

## 2023-12-01 LAB — MICROALBUMIN, URINE WAIVED
Creatinine, Urine Waived: 300 mg/dL (ref 10–300)
Microalb, Ur Waived: 80 mg/L — ABNORMAL HIGH (ref 0–19)

## 2023-12-02 LAB — DRUG SCREEN 764883 11+OXYCO+ALC+CRT-BUND

## 2023-12-03 LAB — COMPREHENSIVE METABOLIC PANEL
ALT: 22 [IU]/L (ref 0–44)
AST: 17 [IU]/L (ref 0–40)
Albumin: 4.1 g/dL (ref 4.1–5.1)
Alkaline Phosphatase: 74 [IU]/L (ref 44–121)
BUN/Creatinine Ratio: 7 — ABNORMAL LOW (ref 9–20)
BUN: 9 mg/dL (ref 6–24)
Bilirubin Total: 0.7 mg/dL (ref 0.0–1.2)
CO2: 21 mmol/L (ref 20–29)
Calcium: 8.5 mg/dL — ABNORMAL LOW (ref 8.7–10.2)
Chloride: 107 mmol/L — ABNORMAL HIGH (ref 96–106)
Creatinine, Ser: 1.38 mg/dL — ABNORMAL HIGH (ref 0.76–1.27)
Globulin, Total: 2.9 g/dL (ref 1.5–4.5)
Glucose: 113 mg/dL — ABNORMAL HIGH (ref 70–99)
Potassium: 3.5 mmol/L (ref 3.5–5.2)
Sodium: 140 mmol/L (ref 134–144)
Total Protein: 7 g/dL (ref 6.0–8.5)
eGFR: 65 mL/min/{1.73_m2} (ref >59)

## 2023-12-03 LAB — CBC WITH DIFFERENTIAL/PLATELET
Basophils Absolute: 0 10*3/uL (ref 0.0–0.2)
Basos: 0 %
EOS (ABSOLUTE): 0.2 10*3/uL (ref 0.0–0.4)
Eos: 2 %
Hematocrit: 44.8 % (ref 37.5–51.0)
Hemoglobin: 14.8 g/dL (ref 13.0–17.7)
Immature Grans (Abs): 0 10*3/uL (ref 0.0–0.1)
Immature Granulocytes: 0 %
Lymphocytes Absolute: 3 10*3/uL (ref 0.7–3.1)
Lymphs: 33 %
MCH: 29.1 pg (ref 26.6–33.0)
MCHC: 33 g/dL (ref 31.5–35.7)
MCV: 88 fL (ref 79–97)
Monocytes Absolute: 0.5 10*3/uL (ref 0.1–0.9)
Monocytes: 5 %
Neutrophils Absolute: 5.4 10*3/uL (ref 1.4–7.0)
Neutrophils: 60 %
Platelets: 293 10*3/uL (ref 150–450)
RBC: 5.08 x10E6/uL (ref 4.14–5.80)
RDW: 13 % (ref 11.6–15.4)
WBC: 9.1 10*3/uL (ref 3.4–10.8)

## 2023-12-03 LAB — TESTOSTERONE, FREE, TOTAL, SHBG
Sex Hormone Binding: 27.4 nmol/L (ref 16.5–55.9)
Testosterone, Free: 5.3 pg/mL — ABNORMAL LOW (ref 6.8–21.5)
Testosterone: 196 ng/dL — ABNORMAL LOW (ref 264–916)

## 2023-12-03 LAB — LIPID PANEL W/O CHOL/HDL RATIO
Cholesterol, Total: 168 mg/dL (ref 100–199)
HDL: 42 mg/dL (ref >39)
LDL Chol Calc (NIH): 106 mg/dL — ABNORMAL HIGH (ref 0–99)
Triglycerides: 108 mg/dL (ref 0–149)
VLDL Cholesterol Cal: 20 mg/dL (ref 5–40)

## 2023-12-03 LAB — PSA: Prostate Specific Ag, Serum: 0.6 ng/mL (ref 0.0–4.0)

## 2023-12-03 LAB — TSH: TSH: 2.95 u[IU]/mL (ref 0.450–4.500)

## 2023-12-04 NOTE — Assessment & Plan Note (Signed)
 Rechecking labs today. Await results. Treat as needed.

## 2023-12-04 NOTE — Assessment & Plan Note (Signed)
 Not doing well. Will continue sertraline and add vraylar. PRN alprazolam. Call with any concerns. Recheck 1 month.

## 2023-12-04 NOTE — Assessment & Plan Note (Signed)
 Never got his CPAP- will get him set up for split night. Await results.

## 2023-12-04 NOTE — Assessment & Plan Note (Signed)
 Encouraged diet and exercise with goal of losing 1-2lbs per week. Call with any concerns.

## 2023-12-04 NOTE — Assessment & Plan Note (Signed)
 Under good control on current regimen. Continue current regimen. Continue to monitor. Call with any concerns. Refills given. Labs drawn today.

## 2023-12-05 ENCOUNTER — Encounter: Payer: Self-pay | Admitting: Family Medicine

## 2023-12-06 LAB — BENZODIAZEPINES CONFIRM, URINE
Alprazolam Conf.: 107 ng/mL
Alprazolam: POSITIVE — AB
Benzodiazepines: POSITIVE ng/mL — AB
Clonazepam: NEGATIVE
Flurazepam: NEGATIVE
Lorazepam: NEGATIVE
Midazolam: NEGATIVE
Nordiazepam: NEGATIVE
Oxazepam: NEGATIVE
Temazepam: NEGATIVE
Triazolam: NEGATIVE

## 2023-12-06 LAB — DRUG SCREEN 764883 11+OXYCO+ALC+CRT-BUND
Creatinine: 279.8 mg/dL (ref 20.0–300.0)
pH, Urine: 5.3 (ref 4.5–8.9)

## 2023-12-21 ENCOUNTER — Other Ambulatory Visit: Payer: Self-pay | Admitting: Family Medicine

## 2023-12-29 ENCOUNTER — Telehealth: Payer: Self-pay

## 2023-12-29 ENCOUNTER — Ambulatory Visit (INDEPENDENT_AMBULATORY_CARE_PROVIDER_SITE_OTHER): Payer: 59 | Admitting: Family Medicine

## 2023-12-29 VITALS — BP 123/79 | HR 67 | Wt 368.0 lb

## 2023-12-29 DIAGNOSIS — F419 Anxiety disorder, unspecified: Secondary | ICD-10-CM | POA: Diagnosis not present

## 2023-12-29 DIAGNOSIS — I10 Essential (primary) hypertension: Secondary | ICD-10-CM | POA: Diagnosis not present

## 2023-12-29 DIAGNOSIS — M5441 Lumbago with sciatica, right side: Secondary | ICD-10-CM

## 2023-12-29 MED ORDER — CYCLOBENZAPRINE HCL 10 MG PO TABS: 10.0000 mg | ORAL_TABLET | Freq: Three times a day (TID) | ORAL | 0 refills | Status: DC | PRN

## 2023-12-29 MED ORDER — CARIPRAZINE HCL 1.5 MG PO CAPS: 1.5000 mg | ORAL_CAPSULE | ORAL | 3 refills | Status: DC

## 2023-12-29 NOTE — Assessment & Plan Note (Signed)
Under good control on current regimen. Continue current regimen. Continue to monitor. Call with any concerns. Refills up to date.   

## 2023-12-29 NOTE — Telephone Encounter (Signed)
Patient was denied with his current Doctor, hospital. Please advise?

## 2023-12-29 NOTE — Telephone Encounter (Signed)
-----   Message from Olevia Perches sent at 12/29/2023  9:04 AM EST ----- Was not able to get his vraylar- did we get a PA?

## 2023-12-29 NOTE — Progress Notes (Unsigned)
BP 123/79   Pulse 67   Wt (!) 368 lb (166.9 kg)   SpO2 98%   BMI 49.91 kg/m    Subjective:    Patient ID: Shane Oneill, male    DOB: [birth year], 43 y.o.   MRN: [id number]  HPI: Shane Oneill is a 43 y.o. male  Chief Complaint  Patient presents with   Anxiety    Patient says his insurance denied the Vraylar, so he was not able to start the medication and would like to discuss with provider at today's visit.    Back Pain    Patient says he has noticed a lower back pain for almost a week now. Patient says it started on the right side of hip area and now radiating all around his lower back. Patient has taken Tylenol and it seems to help a little.    ANXIETY/STRESS- was not able to get his medicine due to insurance issues, feeling a little better, but still not good Duration: chronic Status:exacerbated Anxious mood: yes  Excessive worrying: yes Irritability: no  Sweating: no Nausea: no Palpitations:no Hyperventilation: no Panic attacks: no Agoraphobia: no  Obscessions/compulsions: no Depressed mood: yes    12/29/2023    8:48 AM 12/04/2023   10:00 AM 07/25/2023    3:10 PM 10/14/2022    8:50 AM 06/28/2022   10:28 AM  Depression screen PHQ 2/9  Decreased Interest 0 0 0 0 0  Down, Depressed, Hopeless 0 1 0 1 1  PHQ - 2 Score 0 1 0 1 1  Altered sleeping 1 2 0 0 0  Tired, decreased energy 1 2 1  0 2  Change in appetite 0 2 0 0 0  Feeling bad or failure about yourself  0 0 0 0 0  Trouble concentrating 0 0 0 0 0  Moving slowly or fidgety/restless 0 0 0 0 0  Suicidal thoughts 0 0 0 0 0  PHQ-9 Score 2 7 1 1 3   Difficult doing work/chores Not difficult at all Somewhat difficult Not difficult at all Not difficult at all Somewhat difficult   Anhedonia: no Weight changes: no Insomnia: no   Hypersomnia: no Fatigue/loss of energy: yes Feelings of worthlessness: yes Feelings of guilt: no Impaired concentration/indecisiveness: no Suicidal ideations: no  Crying spells: no Recent  Stressors/Life Changes: no   Relationship problems: no   Family stress: no     Financial stress: no    Job stress: no    Recent death/loss: no   BACK PAIN Duration:  almost a week Mechanism of injury: unknown Location: R low back and hip Onset: sudden Severity: moderate Quality: aching, shooting, sore Frequency: intermittent Radiation: R hip Aggravating factors: movement Alleviating factors: rest, tylenol Status: stable Treatments attempted: rest, ice, heat, and APAP  Relief with NSAIDs?: No NSAIDs Taken Nighttime pain:  no Paresthesias / decreased sensation:  no Bowel / bladder incontinence:  no Fevers:  no Dysuria / urinary frequency:  no  Relevant past medical, surgical, family and social history reviewed and updated as indicated. Interim medical history since our last visit reviewed. Allergies and medications reviewed and updated.  Review of Systems  Constitutional: Negative.   Respiratory: Negative.    Cardiovascular: Negative.   Musculoskeletal:  Positive for back pain and myalgias. Negative for arthralgias, gait problem, joint swelling, neck pain and neck stiffness.  Skin: Negative.   Psychiatric/Behavioral:  Positive for dysphoric mood. Negative for agitation, behavioral problems, confusion, decreased concentration, hallucinations, self-injury, sleep disturbance and suicidal ideas. The patient  is nervous/anxious. The patient is not hyperactive.     Per HPI unless specifically indicated above     Objective:    BP 123/79   Pulse 67   Wt (!) 368 lb (166.9 kg)   SpO2 98%   BMI 49.91 kg/m   Wt Readings from Last 3 Encounters:  12/29/23 (!) 368 lb (166.9 kg)  11/30/23 (!) 366 lb 12.8 oz (166.4 kg)  07/25/23 (!) 374 lb 12.8 oz (170 kg)    Physical Exam Vitals and nursing note reviewed.  Constitutional:      General: He is not in acute distress.    Appearance: Normal appearance. He is not ill-appearing, toxic-appearing or diaphoretic.  HENT:     Head:  Normocephalic and atraumatic.     Right Ear: External ear normal.     Left Ear: External ear normal.     Nose: Nose normal.     Mouth/Throat:     Mouth: Mucous membranes are moist.     Pharynx: Oropharynx is clear.  Eyes:     General: No scleral icterus.       Right eye: No discharge.        Left eye: No discharge.     Extraocular Movements: Extraocular movements intact.     Conjunctiva/sclera: Conjunctivae normal.     Pupils: Pupils are equal, round, and reactive to light.  Cardiovascular:     Rate and Rhythm: Normal rate and regular rhythm.     Pulses: Normal pulses.     Heart sounds: Normal heart sounds. No murmur heard.    No friction rub. No gallop.  Pulmonary:     Effort: Pulmonary effort is normal. No respiratory distress.     Breath sounds: Normal breath sounds. No stridor. No wheezing, rhonchi or rales.  Chest:     Chest wall: No tenderness.  Musculoskeletal:        General: Normal range of motion.     Cervical back: Normal range of motion and neck supple.  Skin:    General: Skin is warm and dry.     Capillary Refill: Capillary refill takes less than 2 seconds.     Coloration: Skin is not jaundiced or pale.     Findings: No bruising, erythema, lesion or rash.  Neurological:     General: No focal deficit present.     Mental Status: He is alert and oriented to person, place, and time. Mental status is at baseline.  Psychiatric:        Mood and Affect: Mood normal.        Behavior: Behavior normal.        Thought Content: Thought content normal.        Judgment: Judgment normal.     Results for orders placed or performed in visit on 12/01/23  [id number] 11+Oxyco+Alc+Crt-Bund   Collection Time: 12/01/23  9:59 AM  Result Value Ref Range   Ethanol Negative Cutoff=0.020 %   Amphetamines, Urine Negative Cutoff=1000 ng/mL   Barbiturate Negative Cutoff=200 ng/mL   BENZODIAZ UR QL See Final Results Cutoff=200 ng/mL   Cannabinoid Quant, Ur Negative Cutoff=50 ng/mL    Cocaine (Metabolite) Negative Cutoff=300 ng/mL   OPIATE SCREEN URINE Negative Cutoff=300 ng/mL   Oxycodone/Oxymorphone, Urine Negative Cutoff=300 ng/mL   Phencyclidine Negative Cutoff=25 ng/mL   Methadone Screen, Urine Negative Cutoff=300 ng/mL   Propoxyphene Negative Cutoff=300 ng/mL   Meperidine Negative Cutoff=200 ng/mL   Tramadol Negative Cutoff=200 ng/mL   Creatinine 279.8 20.0 - 300.0 mg/dL  pH, Urine 5.3 4.5 - 8.9  Microalbumin, Urine Waived   Collection Time: 12/01/23  9:59 AM  Result Value Ref Range   Microalb, Ur Waived 80 (H) 0 - 19 mg/L   Creatinine, Urine Waived 300 10 - 300 mg/dL   Microalb/Creat Ratio 30-300 (H) <30 mg/g  Benzodiazepines Confirm, Urine   Collection Time: 12/01/23  9:59 AM  Result Value Ref Range   Benzodiazepines Positive (A) Cutoff=200 ng/mL   Nordiazepam Negative Cutoff=100   Oxazepam Negative Cutoff=100   Flurazepam Negative Cutoff=100   Lorazepam Negative Cutoff=100   Alprazolam Positive (A)    Alprazolam Conf. 107 Cutoff=100 ng/mL   Clonazepam Negative Cutoff=100   Temazepam Negative Cutoff=100   Triazolam Negative Cutoff=100   Midazolam Negative Cutoff=100  Testosterone, free, total([facility])   Collection Time: 12/01/23 10:02 AM  Result Value Ref Range   Testosterone 196 (L) 264 - 916 ng/dL   Testosterone, Free 5.3 (L) 6.8 - 21.5 pg/mL   Sex Hormone Binding 27.4 16.5 - 55.9 nmol/L  TSH   Collection Time: 12/01/23 10:02 AM  Result Value Ref Range   TSH 2.950 0.450 - 4.500 uIU/mL  PSA   Collection Time: 12/01/23 10:02 AM  Result Value Ref Range   Prostate Specific Ag, Serum 0.6 0.0 - 4.0 ng/mL  Lipid Panel w/o Chol/HDL Ratio   Collection Time: 12/01/23 10:02 AM  Result Value Ref Range   Cholesterol, Total 168 100 - 199 mg/dL   Triglycerides 045 0 - 149 mg/dL   HDL 42 >40 mg/dL   VLDL Cholesterol Cal 20 5 - 40 mg/dL   LDL Chol Calc (NIH) 981 (H) 0 - 99 mg/dL  CBC with Differential/Platelet   Collection Time: 12/01/23  10:02 AM  Result Value Ref Range   WBC 9.1 3.4 - 10.8 x10E3/uL   RBC 5.08 4.14 - 5.80 x10E6/uL   Hemoglobin 14.8 13.0 - 17.7 g/dL   Hematocrit 19.1 47.8 - 51.0 %   MCV 88 79 - 97 fL   MCH 29.1 26.6 - 33.0 pg   MCHC 33.0 31.5 - 35.7 g/dL   RDW 29.5 62.1 - 30.8 %   Platelets 293 150 - 450 x10E3/uL   Neutrophils 60 Not Estab. %   Lymphs 33 Not Estab. %   Monocytes 5 Not Estab. %   Eos 2 Not Estab. %   Basos 0 Not Estab. %   Neutrophils Absolute 5.4 1.4 - 7.0 x10E3/uL   Lymphocytes Absolute 3.0 0.7 - 3.1 x10E3/uL   Monocytes Absolute 0.5 0.1 - 0.9 x10E3/uL   EOS (ABSOLUTE) 0.2 0.0 - 0.4 x10E3/uL   Basophils Absolute 0.0 0.0 - 0.2 x10E3/uL   Immature Granulocytes 0 Not Estab. %   Immature Grans (Abs) 0.0 0.0 - 0.1 x10E3/uL  Comprehensive metabolic panel   Collection Time: 12/01/23 10:02 AM  Result Value Ref Range   Glucose 113 (H) 70 - 99 mg/dL   BUN 9 6 - 24 mg/dL   Creatinine, Ser 6.57 (H) 0.76 - 1.27 mg/dL   eGFR 65 >84 ON/GEX/5.28   BUN/Creatinine Ratio 7 (L) 9 - 20   Sodium 140 134 - 144 mmol/L   Potassium 3.5 3.5 - 5.2 mmol/L   Chloride 107 (H) 96 - 106 mmol/L   CO2 21 20 - 29 mmol/L   Calcium 8.5 (L) 8.7 - 10.2 mg/dL   Total Protein 7.0 6.0 - 8.5 g/dL   Albumin 4.1 4.1 - 5.1 g/dL   Globulin, Total 2.9 1.5 - 4.5 g/dL   Bilirubin  Total 0.7 0.0 - 1.2 mg/dL   Alkaline Phosphatase 74 44 - 121 IU/L   AST 17 0 - 40 IU/L   ALT 22 0 - 44 IU/L      Assessment & Plan:   Problem List Items Addressed This Visit       Cardiovascular and Mediastinum   Primary hypertension   Under good control on current regimen. Continue current regimen. Continue to monitor. Call with any concerns. Refills up to date.        Relevant Orders   Basic metabolic panel     Other   Anxiety   Unable to get his vraylar. Will check on coverage and change if need to. Call with any concerns. Continue to monitor.       Other Visit Diagnoses       Acute right-sided low back pain with  right-sided sciatica    -  Primary   Will treat with flexeril and stretches. Call with any concerns or if not getting better. Continue to monitor.   Relevant Medications   cyclobenzaprine (FLEXERIL) 10 MG tablet   ARIPiprazole (ABILIFY) 2 MG tablet        Follow up plan: Return in about 4 weeks (around 01/26/2024).

## 2023-12-29 NOTE — Telephone Encounter (Signed)
Prior authorization was initiated via CoverMyMeds with patient's new insurance of [address]. Awaiting determination from insurance.  KEY: [id number]

## 2023-12-31 ENCOUNTER — Encounter: Payer: Self-pay | Admitting: Family Medicine

## 2023-12-31 MED ORDER — ARIPIPRAZOLE 2 MG PO TABS: 2.0000 mg | ORAL_TABLET | Freq: Every day | ORAL | 3 refills | Status: DC

## 2023-12-31 NOTE — Assessment & Plan Note (Signed)
Unable to get his vraylar. Will check on coverage and change if need to. Call with any concerns. Continue to monitor.

## 2024-01-02 ENCOUNTER — Encounter: Payer: Self-pay | Admitting: Family Medicine

## 2024-01-07 ENCOUNTER — Other Ambulatory Visit: Payer: Self-pay | Admitting: Family Medicine

## 2024-01-07 DIAGNOSIS — I10 Essential (primary) hypertension: Secondary | ICD-10-CM

## 2024-02-02 ENCOUNTER — Other Ambulatory Visit: Payer: Self-pay | Admitting: Family Medicine

## 2024-02-05 NOTE — Telephone Encounter (Signed)
 Over due for follow up

## 2024-02-05 NOTE — Telephone Encounter (Signed)
 Requested medication (s) are due for refill today: No  Requested medication (s) are on the active medication list: yes  Last refill:  12/31/23  Future visit scheduled: No  Notes to clinic:  Not delegated, see pharmacy request.    Requested Prescriptions  Pending Prescriptions Disp Refills   ARIPiprazole (ABILIFY) 2 MG tablet [Pharmacy Med Name: ARIPIPRAZOLE 2 MG TABLET] 90 tablet 2    Sig: TAKE 1 TABLET BY MOUTH EVERY DAY     Not Delegated - Psychiatry:  Antipsychotics - Second Generation (Atypical) - aripiprazole Failed - 02/05/2024 10:15 AM      Failed - This refill cannot be delegated      Failed - Lipid Panel in normal range within the last 12 months    Cholesterol, Total  Date Value Ref Range Status  12/01/2023 168 100 - 199 mg/dL Final   LDL Chol Calc (NIH)  Date Value Ref Range Status  12/01/2023 106 (H) 0 - 99 mg/dL Final   HDL  Date Value Ref Range Status  12/01/2023 42 >39 mg/dL Final   Triglycerides  Date Value Ref Range Status  12/01/2023 108 0 - 149 mg/dL Final         Passed - TSH in normal range and within 360 days    TSH  Date Value Ref Range Status  12/01/2023 2.950 0.450 - 4.500 uIU/mL Final         Passed - Completed PHQ-2 or PHQ-9 in the last 360 days      Passed - Last BP in normal range    BP Readings from Last 1 Encounters:  12/29/23 123/79         Passed - Last Heart Rate in normal range    Pulse Readings from Last 1 Encounters:  12/29/23 67         Passed - Valid encounter within last 6 months    Recent Outpatient Visits           1 month ago Acute right-sided low back pain with right-sided sciatica   [place] [facility] [place], Megan P, DO   2 months ago Routine general medical examination at a health care facility   [facility], Megan P, DO   6 months ago Factor V Leiden mutation [facility] [place])   [facility] [place], [place], DO   1 year ago COVID-19   [facility] [place], [place], DO   1 year ago Routine general medical examination at a health care facility   [facility], Megan P, DO              Passed - CBC within normal limits and completed in the last 12 months    WBC  Date Value Ref Range Status  12/01/2023 9.1 3.4 - 10.8 x10E3/uL Final  09/22/2021 6.4 4.0 - 10.5 K/uL Final   RBC  Date Value Ref Range Status  12/01/2023 5.08 4.14 - 5.80 x10E6/uL Final  09/22/2021 4.90 4.22 - 5.81 MIL/uL Final   Hemoglobin  Date Value Ref Range Status  12/01/2023 14.8 13.0 - 17.7 g/dL Final   Hematocrit  Date Value Ref Range Status  12/01/2023 44.8 37.5 - 51.0 % Final   MCHC  Date Value Ref Range Status  12/01/2023 33.0 31.5 - 35.7 g/dL Final  03/47/4259 56.3 30.0 - 36.0 g/dL Final   [facility]  Date Value Ref Range Status  12/01/2023 29.1 26.6 -  33.0 pg Final  09/22/2021 28.4 26.0 - 34.0 pg Final   MCV  Date Value Ref Range Status  12/01/2023 88 79 - 97 fL Final   No results found for: "PLTCOUNTKUC", "LABPLAT", "POCPLA" RDW  Date Value Ref Range Status  12/01/2023 13.0 11.6 - 15.4 % Final         Passed - CMP within normal limits and completed in the last 12 months    Albumin  Date Value Ref Range Status  12/01/2023 4.1 4.1 - 5.1 g/dL Final   Alkaline Phosphatase  Date Value Ref Range Status  12/01/2023 74 44 - 121 IU/L Final   ALT  Date Value Ref Range Status  12/01/2023 22 0 - 44 IU/L Final   AST  Date Value Ref Range Status  12/01/2023 17 0 - 40 IU/L Final   BUN  Date Value Ref Range Status  12/01/2023 9 6 - 24 mg/dL Final   Calcium  Date Value Ref Range Status  12/01/2023 8.5 (L) 8.7 - 10.2 mg/dL Final   CO2  Date Value Ref Range Status  12/01/2023 21 20 - 29 mmol/L Final   Creatinine  Date Value Ref Range Status  12/01/2023 279.8 20.0 - 300.0 mg/dL Final   Creatinine, Ser  Date Value Ref Range Status  12/01/2023 1.38 (H) 0.76 - 1.27 mg/dL  Final   Glucose  Date Value Ref Range Status  12/01/2023 113 (H) 70 - 99 mg/dL Final   Glucose, Bld  Date Value Ref Range Status  09/22/2021 156 (H) 70 - 99 mg/dL Final    Comment:    Glucose reference range applies only to samples taken after fasting for at least 8 hours.   Potassium  Date Value Ref Range Status  12/01/2023 3.5 3.5 - 5.2 mmol/L Final   Sodium  Date Value Ref Range Status  12/01/2023 140 134 - 144 mmol/L Final   Bilirubin Total  Date Value Ref Range Status  12/01/2023 0.7 0.0 - 1.2 mg/dL Final   Protein,UA  Date Value Ref Range Status  10/14/2022 Negative Negative/Trace Final   Total Protein  Date Value Ref Range Status  12/01/2023 7.0 6.0 - 8.5 g/dL Final   GFR calc Af Amer  Date Value Ref Range Status  09/11/2020 83 >59 mL/min/1.73 Final    Comment:    **In accordance with recommendations from the NKF-ASN Task force,**   [facility] is in the process of updating its eGFR calculation to the   2021 CKD-EPI creatinine equation that estimates kidney function   without a race variable.    eGFR  Date Value Ref Range Status  12/01/2023 65 >59 mL/min/1.73 Final   GFR, Estimated  Date Value Ref Range Status  09/22/2021 >60 >60 mL/min Final    Comment:    (NOTE) Calculated using the CKD-EPI Creatinine Equation (2021)

## 2024-02-08 NOTE — Telephone Encounter (Signed)
 Called patient left message for patient to call back to schedule office visit for med refill

## 2024-02-21 ENCOUNTER — Encounter: Payer: Self-pay | Admitting: Family Medicine

## 2024-03-25 ENCOUNTER — Other Ambulatory Visit: Payer: Self-pay | Admitting: Family Medicine

## 2024-03-25 ENCOUNTER — Encounter: Payer: Self-pay | Admitting: Family Medicine

## 2024-03-27 NOTE — Telephone Encounter (Signed)
 Pharmacy comment: Alternative Requested:PA REQUIRED FOR OMEPRAZOLE .

## 2024-04-03 ENCOUNTER — Other Ambulatory Visit: Payer: Self-pay | Admitting: Family Medicine

## 2024-04-05 NOTE — Telephone Encounter (Signed)
 Requested medications are due for refill today.  yes  Requested medications are on the active medications list.  yes  Last refill. 02/14/2024 #30 0 rf  Future visit scheduled.   no  Notes to clinic.  Refill not delegated.    Requested Prescriptions  Pending Prescriptions Disp Refills   ARIPiprazole  (ABILIFY ) 2 MG tablet [Pharmacy Med Name: ARIPIPRAZOLE  2 MG TABLET] 30 tablet 0    Sig: TAKE 1 TABLET BY MOUTH EVERY DAY     Not Delegated - Psychiatry:  Antipsychotics - Second Generation (Atypical) - aripiprazole  Failed - 04/05/2024  8:30 AM      Failed - This refill cannot be delegated      Failed - Valid encounter within last 6 months    Recent Outpatient Visits   None            Failed - Lipid Panel in normal range within the last 12 months    Cholesterol, Total  Date Value Ref Range Status  12/01/2023 168 100 - 199 mg/dL Final   LDL Chol Calc (NIH)  Date Value Ref Range Status  12/01/2023 106 (H) 0 - 99 mg/dL Final   HDL  Date Value Ref Range Status  12/01/2023 42 >39 mg/dL Final   Triglycerides  Date Value Ref Range Status  12/01/2023 108 0 - 149 mg/dL Final         Passed - TSH in normal range and within 360 days    TSH  Date Value Ref Range Status  12/01/2023 2.950 0.450 - 4.500 uIU/mL Final         Passed - Completed PHQ-2 or PHQ-9 in the last 360 days      Passed - Last BP in normal range    BP Readings from Last 1 Encounters:  12/29/23 123/79         Passed - Last Heart Rate in normal range    Pulse Readings from Last 1 Encounters:  12/29/23 67         Passed - CBC within normal limits and completed in the last 12 months    WBC  Date Value Ref Range Status  12/01/2023 9.1 3.4 - 10.8 x10E3/uL Final  09/22/2021 6.4 4.0 - 10.5 K/uL Final   RBC  Date Value Ref Range Status  12/01/2023 5.08 4.14 - 5.80 x10E6/uL Final  09/22/2021 4.90 4.22 - 5.81 MIL/uL Final   Hemoglobin  Date Value Ref Range Status  12/01/2023 14.8 13.0 - 17.7 g/dL Final    Hematocrit  Date Value Ref Range Status  12/01/2023 44.8 37.5 - 51.0 % Final   MCHC  Date Value Ref Range Status  12/01/2023 33.0 31.5 - 35.7 g/dL Final  40/98/1191 47.8 30.0 - 36.0 g/dL Final   [facility]  Date Value Ref Range Status  12/01/2023 29.1 26.6 - 33.0 pg Final  09/22/2021 28.4 26.0 - 34.0 pg Final   MCV  Date Value Ref Range Status  12/01/2023 88 79 - 97 fL Final   No results found for: "PLTCOUNTKUC", "LABPLAT", "POCPLA" RDW  Date Value Ref Range Status  12/01/2023 13.0 11.6 - 15.4 % Final         Passed - CMP within normal limits and completed in the last 12 months    Albumin  Date Value Ref Range Status  12/01/2023 4.1 4.1 - 5.1 g/dL Final   Alkaline Phosphatase  Date Value Ref Range Status  12/01/2023 74 44 - 121 IU/L Final   ALT  Date Value Ref  Range Status  12/01/2023 22 0 - 44 IU/L Final   AST  Date Value Ref Range Status  12/01/2023 17 0 - 40 IU/L Final   BUN  Date Value Ref Range Status  12/01/2023 9 6 - 24 mg/dL Final   Calcium  Date Value Ref Range Status  12/01/2023 8.5 (L) 8.7 - 10.2 mg/dL Final   CO2  Date Value Ref Range Status  12/01/2023 21 20 - 29 mmol/L Final   Creatinine  Date Value Ref Range Status  12/01/2023 279.8 20.0 - 300.0 mg/dL Final   Creatinine, Ser  Date Value Ref Range Status  12/01/2023 1.38 (H) 0.76 - 1.27 mg/dL Final   Glucose  Date Value Ref Range Status  12/01/2023 113 (H) 70 - 99 mg/dL Final   Glucose, Bld  Date Value Ref Range Status  09/22/2021 156 (H) 70 - 99 mg/dL Final    Comment:    Glucose reference range applies only to samples taken after fasting for at least 8 hours.   Potassium  Date Value Ref Range Status  12/01/2023 3.5 3.5 - 5.2 mmol/L Final   Sodium  Date Value Ref Range Status  12/01/2023 140 134 - 144 mmol/L Final   Bilirubin Total  Date Value Ref Range Status  12/01/2023 0.7 0.0 - 1.2 mg/dL Final   Protein,UA  Date Value Ref Range Status  10/14/2022 Negative  Negative/Trace Final   Total Protein  Date Value Ref Range Status  12/01/2023 7.0 6.0 - 8.5 g/dL Final   GFR calc Af Amer  Date Value Ref Range Status  09/11/2020 83 >59 mL/min/1.73 Final    Comment:    **In accordance with recommendations from the NKF-ASN Task force,**   [facility] is in the process of updating its eGFR calculation to the   2021 CKD-EPI creatinine equation that estimates kidney function   without a race variable.    eGFR  Date Value Ref Range Status  12/01/2023 65 >59 mL/min/1.73 Final   GFR, Estimated  Date Value Ref Range Status  09/22/2021 >60 >60 mL/min Final    Comment:    (NOTE) Calculated using the CKD-EPI Creatinine Equation (2021)

## 2024-04-05 NOTE — Telephone Encounter (Signed)
 Patient is overdue for a follow up appointment. Please call and schedule appointment and then route to provider for refill.

## 2024-04-05 NOTE — Telephone Encounter (Signed)
 Called patient. Left message for patient to call office and schedule follow up appointment.

## 2024-04-29 NOTE — Telephone Encounter (Signed)
 2nd attempt to call patient, left message for patient to call office and schedule appt.

## 2024-06-03 ENCOUNTER — Encounter: Payer: Self-pay | Admitting: Family Medicine

## 2024-06-03 ENCOUNTER — Other Ambulatory Visit: Payer: Self-pay | Admitting: Family Medicine

## 2024-06-04 MED ORDER — OMEPRAZOLE 20 MG PO CPDR: 20.0000 mg | DELAYED_RELEASE_CAPSULE | Freq: Every day | ORAL | 0 refills | Status: DC

## 2024-06-04 MED ORDER — ARIPIPRAZOLE 2 MG PO TABS: 2.0000 mg | ORAL_TABLET | Freq: Every day | ORAL | 0 refills | Status: DC

## 2024-06-04 MED ORDER — SERTRALINE HCL 100 MG PO TABS: ORAL_TABLET | ORAL | 0 refills | Status: DC

## 2024-06-05 ENCOUNTER — Other Ambulatory Visit: Payer: Self-pay | Admitting: Family Medicine

## 2024-06-05 NOTE — Telephone Encounter (Signed)
 Requested medication (s) are due for refill today: no  Requested medication (s) are on the active medication list: yes  Last refill:  06/04/24 #30  Future visit scheduled: yes  Notes to clinic:  med not delegated to NT to RF   Requested Prescriptions  Pending Prescriptions Disp Refills   ARIPiprazole  (ABILIFY ) 2 MG tablet [Pharmacy Med Name: ARIPIPRAZOLE  2 MG TABLET] 30 tablet 0    Sig: TAKE 1 TABLET BY MOUTH EVERY DAY     Not Delegated - Psychiatry:  Antipsychotics - Second Generation (Atypical) - aripiprazole  Failed - 06/05/2024  1:24 PM      Failed - This refill cannot be delegated      Failed - Valid encounter within last 6 months    Recent Outpatient Visits   None            Failed - Lipid Panel in normal range within the last 12 months    Cholesterol, Total  Date Value Ref Range Status  12/01/2023 168 100 - 199 mg/dL Final   LDL Chol Calc (NIH)  Date Value Ref Range Status  12/01/2023 106 (H) 0 - 99 mg/dL Final   HDL  Date Value Ref Range Status  12/01/2023 42 >39 mg/dL Final   Triglycerides  Date Value Ref Range Status  12/01/2023 108 0 - 149 mg/dL Final         Passed - TSH in normal range and within 360 days    TSH  Date Value Ref Range Status  12/01/2023 2.950 0.450 - 4.500 uIU/mL Final         Passed - Completed PHQ-2 or PHQ-9 in the last 360 days      Passed - Last BP in normal range    BP Readings from Last 1 Encounters:  12/29/23 123/79         Passed - Last Heart Rate in normal range    Pulse Readings from Last 1 Encounters:  12/29/23 67         Passed - CBC within normal limits and completed in the last 12 months    WBC  Date Value Ref Range Status  12/01/2023 9.1 3.4 - 10.8 x10E3/uL Final  09/22/2021 6.4 4.0 - 10.5 K/uL Final   RBC  Date Value Ref Range Status  12/01/2023 5.08 4.14 - 5.80 x10E6/uL Final  09/22/2021 4.90 4.22 - 5.81 MIL/uL Final   Hemoglobin  Date Value Ref Range Status  12/01/2023 14.8 13.0 - 17.7 g/dL Final    Hematocrit  Date Value Ref Range Status  12/01/2023 44.8 37.5 - 51.0 % Final   MCHC  Date Value Ref Range Status  12/01/2023 33.0 31.5 - 35.7 g/dL Final  89/73/7977 66.8 30.0 - 36.0 g/dL Final   [facility]  Date Value Ref Range Status  12/01/2023 29.1 26.6 - 33.0 pg Final  09/22/2021 28.4 26.0 - 34.0 pg Final   MCV  Date Value Ref Range Status  12/01/2023 88 79 - 97 fL Final   No results found for: PLTCOUNTKUC, LABPLAT, POCPLA RDW  Date Value Ref Range Status  12/01/2023 13.0 11.6 - 15.4 % Final         Passed - CMP within normal limits and completed in the last 12 months    Albumin  Date Value Ref Range Status  12/01/2023 4.1 4.1 - 5.1 g/dL Final   Alkaline Phosphatase  Date Value Ref Range Status  12/01/2023 74 44 - 121 IU/L Final   ALT  Date Value Ref  Range Status  12/01/2023 22 0 - 44 IU/L Final   AST  Date Value Ref Range Status  12/01/2023 17 0 - 40 IU/L Final   BUN  Date Value Ref Range Status  12/01/2023 9 6 - 24 mg/dL Final   Calcium  Date Value Ref Range Status  12/01/2023 8.5 (L) 8.7 - 10.2 mg/dL Final   CO2  Date Value Ref Range Status  12/01/2023 21 20 - 29 mmol/L Final   Creatinine  Date Value Ref Range Status  12/01/2023 279.8 20.0 - 300.0 mg/dL Final   Creatinine, Ser  Date Value Ref Range Status  12/01/2023 1.38 (H) 0.76 - 1.27 mg/dL Final   Glucose  Date Value Ref Range Status  12/01/2023 113 (H) 70 - 99 mg/dL Final   Glucose, Bld  Date Value Ref Range Status  09/22/2021 156 (H) 70 - 99 mg/dL Final    Comment:    Glucose reference range applies only to samples taken after fasting for at least 8 hours.   Potassium  Date Value Ref Range Status  12/01/2023 3.5 3.5 - 5.2 mmol/L Final   Sodium  Date Value Ref Range Status  12/01/2023 140 134 - 144 mmol/L Final   Bilirubin Total  Date Value Ref Range Status  12/01/2023 0.7 0.0 - 1.2 mg/dL Final   Protein,UA  Date Value Ref Range Status  10/14/2022 Negative  Negative/Trace Final   Total Protein  Date Value Ref Range Status  12/01/2023 7.0 6.0 - 8.5 g/dL Final   GFR calc Af Amer  Date Value Ref Range Status  09/11/2020 83 >59 mL/min/1.73 Final    Comment:    **In accordance with recommendations from the NKF-ASN Task force,**   [facility] is in the process of updating its eGFR calculation to the   2021 CKD-EPI creatinine equation that estimates kidney function   without a race variable.    eGFR  Date Value Ref Range Status  12/01/2023 65 >59 mL/min/1.73 Final   GFR, Estimated  Date Value Ref Range Status  09/22/2021 >60 >60 mL/min Final    Comment:    (NOTE) Calculated using the CKD-EPI Creatinine Equation (2021)

## 2024-06-06 NOTE — Telephone Encounter (Signed)
 Requested Prescriptions  Pending Prescriptions Disp Refills   lisinopril  (ZESTRIL ) 10 MG tablet [Pharmacy Med Name: LISINOPRIL  10 MG TABLET] 90 tablet 0    Sig: TAKE 1 TABLET BY MOUTH EVERY DAY     Cardiovascular:  ACE Inhibitors Failed - 06/06/2024  5:00 PM      Failed - Cr in normal range and within 180 days    Creatinine  Date Value Ref Range Status  12/01/2023 279.8 20.0 - 300.0 mg/dL Final   Creatinine, Ser  Date Value Ref Range Status  12/01/2023 1.38 (H) 0.76 - 1.27 mg/dL Final         Failed - K in normal range and within 180 days    Potassium  Date Value Ref Range Status  12/01/2023 3.5 3.5 - 5.2 mmol/L Final         Failed - Valid encounter within last 6 months    Recent Outpatient Visits   None            Passed - Patient is not pregnant      Passed - Last BP in normal range    BP Readings from Last 1 Encounters:  12/29/23 123/79

## 2024-06-26 ENCOUNTER — Other Ambulatory Visit: Payer: Self-pay | Admitting: Nurse Practitioner

## 2024-06-26 ENCOUNTER — Ambulatory Visit (INDEPENDENT_AMBULATORY_CARE_PROVIDER_SITE_OTHER): Payer: Self-pay | Admitting: Family Medicine

## 2024-06-26 ENCOUNTER — Encounter: Payer: Self-pay | Admitting: Family Medicine

## 2024-06-26 VITALS — BP 132/74 | HR 77 | Temp 97.5°F | Ht 72.0 in | Wt 370.8 lb

## 2024-06-26 DIAGNOSIS — I10 Essential (primary) hypertension: Secondary | ICD-10-CM

## 2024-06-26 DIAGNOSIS — F419 Anxiety disorder, unspecified: Secondary | ICD-10-CM | POA: Diagnosis not present

## 2024-06-26 DIAGNOSIS — Z789 Other specified health status: Secondary | ICD-10-CM

## 2024-06-26 DIAGNOSIS — D6851 Activated protein C resistance: Secondary | ICD-10-CM | POA: Diagnosis not present

## 2024-06-26 MED ORDER — SERTRALINE HCL 100 MG PO TABS: ORAL_TABLET | ORAL | 1 refills | Status: DC

## 2024-06-26 MED ORDER — ALPRAZOLAM 0.5 MG PO TABS: ORAL_TABLET | ORAL | 0 refills | Status: DC

## 2024-06-26 MED ORDER — OMEPRAZOLE 20 MG PO CPDR: 20.0000 mg | DELAYED_RELEASE_CAPSULE | Freq: Every day | ORAL | 1 refills | Status: DC

## 2024-06-26 MED ORDER — APIXABAN 5 MG PO TABS: ORAL_TABLET | ORAL | 3 refills | Status: DC

## 2024-06-26 MED ORDER — ARIPIPRAZOLE 2 MG PO TABS: 2.0000 mg | ORAL_TABLET | Freq: Every day | ORAL | 1 refills | Status: DC

## 2024-06-26 MED ORDER — LISINOPRIL 10 MG PO TABS: 10.0000 mg | ORAL_TABLET | Freq: Every day | ORAL | 1 refills | Status: DC

## 2024-06-26 NOTE — Assessment & Plan Note (Signed)
 Under good control on current regimen. Continue current regimen. Continue to monitor. Call with any concerns. Refills given. Labs drawn today.

## 2024-06-26 NOTE — Progress Notes (Signed)
 BP 132/74   Pulse 77   Temp (!) 97.5 F (36.4 C) (Oral)   Ht 6' (1.829 m)   Wt (!) 370 lb 12.8 oz (168.2 kg)   SpO2 96%   BMI 50.29 kg/m    Subjective:    Patient ID: Shane Oneill, male    DOB: Sep 03, 1981, 43 y.o.   MRN: [id number]  HPI: Clerance Umland is a 43 y.o. male  Chief Complaint  Patient presents with   Hypertension   Anxiety   HYPERTENSION  Hypertension status: controlled  Satisfied with current treatment? yes Duration of hypertension: chronic BP monitoring frequency:  not checking BP range:  BP medication side effects:  no Medication compliance: excellent compliance Previous BP meds:lisinopril  Aspirin: no Recurrent headaches: no Visual changes: no Palpitations: no Dyspnea: no Chest pain: no Lower extremity edema: no Dizzy/lightheaded: no  ANXIETY Duration: chronic Status:controlled Anxious mood: no  Excessive worrying: no Irritability: no  Sweating: no Nausea: no Palpitations:no Hyperventilation: no Panic attacks: no Agoraphobia: no  Obscessions/compulsions: no Depressed mood: no    06/26/2024    2:14 PM 12/29/2023    8:48 AM 12/04/2023   10:00 AM 07/25/2023    3:10 PM 10/14/2022    8:50 AM  Depression screen PHQ 2/9  Decreased Interest 0 0 0 0 0  Down, Depressed, Hopeless 1 0 1 0 1  PHQ - 2 Score 1 0 1 0 1  Altered sleeping 0 1 2 0 0  Tired, decreased energy 0 1 2 1  0  Change in appetite 0 0 2 0 0  Feeling bad or failure about yourself  1 0 0 0 0  Trouble concentrating 0 0 0 0 0  Moving slowly or fidgety/restless 0 0 0 0 0  Suicidal thoughts 0 0 0 0 0  PHQ-9 Score 2 2 7 1 1   Difficult doing work/chores Not difficult at all Not difficult at all Somewhat difficult Not difficult at all Not difficult at all   Anhedonia: no Weight changes: no Insomnia: no  Hypersomnia: no Fatigue/loss of energy: no Feelings of worthlessness: no Feelings of guilt: no Impaired concentration/indecisiveness: no Suicidal ideations: no  Crying spells:  no Recent Stressors/Life Changes: no   Relationship problems: no   Family stress: no     Financial stress: no    Job stress: no    Recent death/loss: no   Relevant past medical, surgical, family and social history reviewed and updated as indicated. Interim medical history since our last visit reviewed. Allergies and medications reviewed and updated.  Review of Systems  Constitutional: Negative.   Respiratory: Negative.    Cardiovascular: Negative.   Musculoskeletal: Negative.   Neurological: Negative.   Psychiatric/Behavioral: Negative.      Per HPI unless specifically indicated above     Objective:    BP 132/74   Pulse 77   Temp (!) 97.5 F (36.4 C) (Oral)   Ht 6' (1.829 m)   Wt (!) 370 lb 12.8 oz (168.2 kg)   SpO2 96%   BMI 50.29 kg/m   Wt Readings from Last 3 Encounters:  06/26/24 (!) 370 lb 12.8 oz (168.2 kg)  12/29/23 (!) 368 lb (166.9 kg)  11/30/23 (!) 366 lb 12.8 oz (166.4 kg)    Physical Exam Vitals and nursing note reviewed.  Constitutional:      General: He is not in acute distress.    Appearance: Normal appearance. He is obese. He is not ill-appearing, toxic-appearing or diaphoretic.  HENT:  Head: Normocephalic and atraumatic.     Right Ear: External ear normal.     Left Ear: External ear normal.     Nose: Nose normal.     Mouth/Throat:     Mouth: Mucous membranes are moist.     Pharynx: Oropharynx is clear.  Eyes:     General: No scleral icterus.       Right eye: No discharge.        Left eye: No discharge.     Extraocular Movements: Extraocular movements intact.     Conjunctiva/sclera: Conjunctivae normal.     Pupils: Pupils are equal, round, and reactive to light.  Cardiovascular:     Rate and Rhythm: Normal rate and regular rhythm.     Pulses: Normal pulses.     Heart sounds: Normal heart sounds. No murmur heard.    No friction rub. No gallop.  Pulmonary:     Effort: Pulmonary effort is normal. No respiratory distress.     Breath  sounds: Normal breath sounds. No stridor. No wheezing, rhonchi or rales.  Chest:     Chest wall: No tenderness.  Musculoskeletal:        General: Normal range of motion.     Cervical back: Normal range of motion and neck supple.  Skin:    General: Skin is warm and dry.     Capillary Refill: Capillary refill takes less than 2 seconds.     Coloration: Skin is not jaundiced or pale.     Findings: No bruising, erythema, lesion or rash.  Neurological:     General: No focal deficit present.     Mental Status: He is alert and oriented to person, place, and time. Mental status is at baseline.  Psychiatric:        Mood and Affect: Mood normal.        Behavior: Behavior normal.        Thought Content: Thought content normal.        Judgment: Judgment normal.     Results for orders placed or performed in visit on 12/01/23  [id number] 11+Oxyco+Alc+Crt-Bund   Collection Time: 12/01/23  9:59 AM  Result Value Ref Range   Ethanol Negative Cutoff=0.020 %   Amphetamines, Urine Negative Cutoff=1000 ng/mL   Barbiturate Negative Cutoff=200 ng/mL   BENZODIAZ UR QL See Final Results Cutoff=200 ng/mL   Cannabinoid Quant, Ur Negative Cutoff=50 ng/mL   Cocaine (Metabolite) Negative Cutoff=300 ng/mL   OPIATE SCREEN URINE Negative Cutoff=300 ng/mL   Oxycodone/Oxymorphone, Urine Negative Cutoff=300 ng/mL   Phencyclidine Negative Cutoff=25 ng/mL   Methadone Screen, Urine Negative Cutoff=300 ng/mL   Propoxyphene Negative Cutoff=300 ng/mL   Meperidine Negative Cutoff=200 ng/mL   Tramadol Negative Cutoff=200 ng/mL   Creatinine 279.8 20.0 - 300.0 mg/dL   pH, Urine 5.3 4.5 - 8.9  Microalbumin, Urine Waived   Collection Time: 12/01/23  9:59 AM  Result Value Ref Range   Microalb, Ur Waived 80 (H) 0 - 19 mg/L   Creatinine, Urine Waived 300 10 - 300 mg/dL   Microalb/Creat Ratio 30-300 (H) <30 mg/g  Benzodiazepines Confirm, Urine   Collection Time: 12/01/23  9:59 AM  Result Value Ref Range   Benzodiazepines  Positive (A) Cutoff=200 ng/mL   Nordiazepam Negative Cutoff=100   Oxazepam Negative Cutoff=100   Flurazepam Negative Cutoff=100   Lorazepam Negative Cutoff=100   Alprazolam  Positive (A)    Alprazolam  Conf. 107 Cutoff=100 ng/mL   Clonazepam Negative Cutoff=100   Temazepam Negative Cutoff=100   Triazolam Negative Cutoff=100  Midazolam Negative Cutoff=100  Testosterone , free, total([facility])   Collection Time: 12/01/23 10:02 AM  Result Value Ref Range   Testosterone  196 (L) 264 - 916 ng/dL   Testosterone , Free 5.3 (L) 6.8 - 21.5 pg/mL   Sex Hormone Binding 27.4 16.5 - 55.9 nmol/L  TSH   Collection Time: 12/01/23 10:02 AM  Result Value Ref Range   TSH 2.950 0.450 - 4.500 uIU/mL  PSA   Collection Time: 12/01/23 10:02 AM  Result Value Ref Range   Prostate Specific Ag, Serum 0.6 0.0 - 4.0 ng/mL  Lipid Panel w/o Chol/HDL Ratio   Collection Time: 12/01/23 10:02 AM  Result Value Ref Range   Cholesterol, Total 168 100 - 199 mg/dL   Triglycerides 891 0 - 149 mg/dL   HDL 42 >60 mg/dL   VLDL Cholesterol Cal 20 5 - 40 mg/dL   LDL Chol Calc (NIH) 893 (H) 0 - 99 mg/dL  CBC with Differential/Platelet   Collection Time: 12/01/23 10:02 AM  Result Value Ref Range   WBC 9.1 3.4 - 10.8 x10E3/uL   RBC 5.08 4.14 - 5.80 x10E6/uL   Hemoglobin 14.8 13.0 - 17.7 g/dL   Hematocrit 55.1 62.4 - 51.0 %   MCV 88 79 - 97 fL   MCH 29.1 26.6 - 33.0 pg   MCHC 33.0 31.5 - 35.7 g/dL   RDW 86.9 88.3 - 84.5 %   Platelets 293 150 - 450 x10E3/uL   Neutrophils 60 Not Estab. %   Lymphs 33 Not Estab. %   Monocytes 5 Not Estab. %   Eos 2 Not Estab. %   Basos 0 Not Estab. %   Neutrophils Absolute 5.4 1.4 - 7.0 x10E3/uL   Lymphocytes Absolute 3.0 0.7 - 3.1 x10E3/uL   Monocytes Absolute 0.5 0.1 - 0.9 x10E3/uL   EOS (ABSOLUTE) 0.2 0.0 - 0.4 x10E3/uL   Basophils Absolute 0.0 0.0 - 0.2 x10E3/uL   Immature Granulocytes 0 Not Estab. %   Immature Grans (Abs) 0.0 0.0 - 0.1 x10E3/uL  Comprehensive metabolic  panel   Collection Time: 12/01/23 10:02 AM  Result Value Ref Range   Glucose 113 (H) 70 - 99 mg/dL   BUN 9 6 - 24 mg/dL   Creatinine, Ser 8.61 (H) 0.76 - 1.27 mg/dL   eGFR 65 >40 fO/fpw/8.26   BUN/Creatinine Ratio 7 (L) 9 - 20   Sodium 140 134 - 144 mmol/L   Potassium 3.5 3.5 - 5.2 mmol/L   Chloride 107 (H) 96 - 106 mmol/L   CO2 21 20 - 29 mmol/L   Calcium 8.5 (L) 8.7 - 10.2 mg/dL   Total Protein 7.0 6.0 - 8.5 g/dL   Albumin 4.1 4.1 - 5.1 g/dL   Globulin, Total 2.9 1.5 - 4.5 g/dL   Bilirubin Total 0.7 0.0 - 1.2 mg/dL   Alkaline Phosphatase 74 44 - 121 IU/L   AST 17 0 - 40 IU/L   ALT 22 0 - 44 IU/L      Assessment & Plan:   Problem List Items Addressed This Visit       Cardiovascular and Mediastinum   Primary hypertension - Primary   Under good control on current regimen. Continue current regimen. Continue to monitor. Call with any concerns. Refills given. Labs drawn today.       Relevant Medications   apixaban  (ELIQUIS ) 5 MG TABS tablet   lisinopril  (ZESTRIL ) 10 MG tablet   Other Relevant Orders   Basic metabolic panel with GFR     Hematopoietic and  Hemostatic   Factor V Leiden mutation (HCC)   Under good control on current regimen. Continue current regimen. Continue to monitor. Call with any concerns. Refills given. Labs drawn today.        Relevant Orders   CBC with Differential/Platelet     Other   Anxiety   Under good control on current regimen. Continue current regimen. Continue to monitor. Call with any concerns. Refills given. Labs drawn today.       Relevant Medications   ALPRAZolam  (XANAX ) 0.5 MG tablet   sertraline  (ZOLOFT ) 100 MG tablet   Other Visit Diagnoses       Hepatitis B vaccination status unknown       Labs drawn today. Await results.   Relevant Orders   Hepatitis B surface antibody,quantitative        Follow up plan: Return in about 6 months (around 12/27/2024) for physical.

## 2024-06-27 ENCOUNTER — Ambulatory Visit: Payer: Self-pay | Admitting: Family Medicine

## 2024-06-29 LAB — CBC WITH DIFFERENTIAL/PLATELET
Basophils Absolute: 0 x10E3/uL (ref 0.0–0.2)
Basos: 0 %
EOS (ABSOLUTE): 0.1 x10E3/uL (ref 0.0–0.4)
Eos: 1 %
Hematocrit: 40 % (ref 37.5–51.0)
Hemoglobin: 13.2 g/dL (ref 13.0–17.7)
Immature Grans (Abs): 0 x10E3/uL (ref 0.0–0.1)
Immature Granulocytes: 0 %
Lymphocytes Absolute: 2.2 x10E3/uL (ref 0.7–3.1)
Lymphs: 25 %
MCH: 29.3 pg (ref 26.6–33.0)
MCHC: 33 g/dL (ref 31.5–35.7)
MCV: 89 fL (ref 79–97)
Monocytes Absolute: 0.4 x10E3/uL (ref 0.1–0.9)
Monocytes: 4 %
Neutrophils Absolute: 5.9 x10E3/uL (ref 1.4–7.0)
Neutrophils: 70 %
Platelets: 257 x10E3/uL (ref 150–450)
RBC: 4.51 x10E6/uL (ref 4.14–5.80)
RDW: 13.1 % (ref 11.6–15.4)
WBC: 8.7 x10E3/uL (ref 3.4–10.8)

## 2024-06-29 LAB — BASIC METABOLIC PANEL WITH GFR
BUN/Creatinine Ratio: 10 (ref 9–20)
BUN: 13 mg/dL (ref 6–24)
CO2: 16 mmol/L — ABNORMAL LOW (ref 20–29)
Calcium: 9 mg/dL (ref 8.7–10.2)
Chloride: 108 mmol/L — ABNORMAL HIGH (ref 96–106)
Creatinine, Ser: 1.34 mg/dL — ABNORMAL HIGH (ref 0.76–1.27)
Glucose: 97 mg/dL (ref 70–99)
Potassium: 3.5 mmol/L (ref 3.5–5.2)
Sodium: 141 mmol/L (ref 134–144)
eGFR: 67 mL/min/1.73 (ref >59)

## 2024-06-29 LAB — HEPATITIS B SURFACE ANTIBODY, QUANTITATIVE: Hepatitis B Surf Ab Quant: 4.5 m[IU]/mL — ABNORMAL LOW

## 2024-07-01 ENCOUNTER — Ambulatory Visit: Payer: Self-pay | Admitting: Family Medicine

## 2024-10-12 ENCOUNTER — Other Ambulatory Visit: Payer: Self-pay | Admitting: Family Medicine

## 2024-10-15 NOTE — Telephone Encounter (Signed)
 Requested medication (s) are due for refill today - no  Requested medication (s) are on the active medication list -yes  Future visit scheduled -yes  Last refill: 06/26/24 #90 1RF  Notes to clinic: non delegated Rx  Requested Prescriptions  Pending Prescriptions Disp Refills   ARIPiprazole  (ABILIFY ) 2 MG tablet [Pharmacy Med Name: ARIPIPRAZOLE  2 MG TABLET] 90 tablet 2    Sig: TAKE 1 TABLET BY MOUTH EVERY DAY     Not Delegated - Psychiatry:  Antipsychotics - Second Generation (Atypical) - aripiprazole  Failed - 10/15/2024 11:52 AM      Failed - This refill cannot be delegated      Failed - Lipid Panel in normal range within the last 12 months    Cholesterol, Total  Date Value Ref Range Status  12/01/2023 168 100 - 199 mg/dL Final   LDL Chol Calc (NIH)  Date Value Ref Range Status  12/01/2023 106 (H) 0 - 99 mg/dL Final   HDL  Date Value Ref Range Status  12/01/2023 42 >39 mg/dL Final   Triglycerides  Date Value Ref Range Status  12/01/2023 108 0 - 149 mg/dL Final         Passed - TSH in normal range and within 360 days    TSH  Date Value Ref Range Status  12/01/2023 2.950 0.450 - 4.500 uIU/mL Final         Passed - Completed PHQ-2 or PHQ-9 in the last 360 days      Passed - Last BP in normal range    BP Readings from Last 1 Encounters:  06/26/24 132/74         Passed - Last Heart Rate in normal range    Pulse Readings from Last 1 Encounters:  06/26/24 77         Passed - Valid encounter within last 6 months    Recent Outpatient Visits           3 months ago Primary hypertension   [facility] [place], Megan P, DO              Passed - CBC within normal limits and completed in the last 12 months    WBC  Date Value Ref Range Status  06/26/2024 8.7 3.4 - 10.8 x10E3/uL Final  09/22/2021 6.4 4.0 - 10.5 K/uL Final   RBC  Date Value Ref Range Status  06/26/2024 4.51 4.14 - 5.80 x10E6/uL Final  09/22/2021 4.90 4.22 - 5.81 MIL/uL  Final   Hemoglobin  Date Value Ref Range Status  06/26/2024 13.2 13.0 - 17.7 g/dL Final   Hematocrit  Date Value Ref Range Status  06/26/2024 40.0 37.5 - 51.0 % Final   MCHC  Date Value Ref Range Status  06/26/2024 33.0 31.5 - 35.7 g/dL Final  89/73/7977 66.8 30.0 - 36.0 g/dL Final   [facility]  Date Value Ref Range Status  06/26/2024 29.3 26.6 - 33.0 pg Final  09/22/2021 28.4 26.0 - 34.0 pg Final   MCV  Date Value Ref Range Status  06/26/2024 89 79 - 97 fL Final   No results found for: PLTCOUNTKUC, LABPLAT, POCPLA RDW  Date Value Ref Range Status  06/26/2024 13.1 11.6 - 15.4 % Final         Passed - CMP within normal limits and completed in the last 12 months    Albumin  Date Value Ref Range Status  12/01/2023 4.1 4.1 - 5.1 g/dL Final   Alkaline Phosphatase  Date  Value Ref Range Status  12/01/2023 74 44 - 121 IU/L Final   ALT  Date Value Ref Range Status  12/01/2023 22 0 - 44 IU/L Final   AST  Date Value Ref Range Status  12/01/2023 17 0 - 40 IU/L Final   BUN  Date Value Ref Range Status  06/26/2024 13 6 - 24 mg/dL Final   Calcium  Date Value Ref Range Status  06/26/2024 9.0 8.7 - 10.2 mg/dL Final   CO2  Date Value Ref Range Status  06/26/2024 16 (L) 20 - 29 mmol/L Final   Creatinine  Date Value Ref Range Status  12/01/2023 279.8 20.0 - 300.0 mg/dL Final   Creatinine, Ser  Date Value Ref Range Status  06/26/2024 1.34 (H) 0.76 - 1.27 mg/dL Final   Glucose  Date Value Ref Range Status  06/26/2024 97 70 - 99 mg/dL Final   Glucose, Bld  Date Value Ref Range Status  09/22/2021 156 (H) 70 - 99 mg/dL Final    Comment:    Glucose reference range applies only to samples taken after fasting for at least 8 hours.   Potassium  Date Value Ref Range Status  06/26/2024 3.5 3.5 - 5.2 mmol/L Final   Sodium  Date Value Ref Range Status  06/26/2024 141 134 - 144 mmol/L Final   Bilirubin Total  Date Value Ref Range Status  12/01/2023 0.7 0.0 - 1.2  mg/dL Final   Protein,UA  Date Value Ref Range Status  10/14/2022 Negative Negative/Trace Final   Total Protein  Date Value Ref Range Status  12/01/2023 7.0 6.0 - 8.5 g/dL Final   GFR calc Af Amer  Date Value Ref Range Status  09/11/2020 83 >59 mL/min/1.73 Final    Comment:    **In accordance with recommendations from the NKF-ASN Task force,**   [facility] is in the process of updating its eGFR calculation to the   2021 CKD-EPI creatinine equation that estimates kidney function   without a race variable.    eGFR  Date Value Ref Range Status  06/26/2024 67 >59 mL/min/1.73 Final   GFR, Estimated  Date Value Ref Range Status  09/22/2021 >60 >60 mL/min Final    Comment:    (NOTE) Calculated using the CKD-EPI Creatinine Equation (2021)             Requested Prescriptions  Pending Prescriptions Disp Refills   ARIPiprazole  (ABILIFY ) 2 MG tablet [Pharmacy Med Name: ARIPIPRAZOLE  2 MG TABLET] 90 tablet 2    Sig: TAKE 1 TABLET BY MOUTH EVERY DAY     Not Delegated - Psychiatry:  Antipsychotics - Second Generation (Atypical) - aripiprazole  Failed - 10/15/2024 11:52 AM      Failed - This refill cannot be delegated      Failed - Lipid Panel in normal range within the last 12 months    Cholesterol, Total  Date Value Ref Range Status  12/01/2023 168 100 - 199 mg/dL Final   LDL Chol Calc (NIH)  Date Value Ref Range Status  12/01/2023 106 (H) 0 - 99 mg/dL Final   HDL  Date Value Ref Range Status  12/01/2023 42 >39 mg/dL Final   Triglycerides  Date Value Ref Range Status  12/01/2023 108 0 - 149 mg/dL Final         Passed - TSH in normal range and within 360 days    TSH  Date Value Ref Range Status  12/01/2023 2.950 0.450 - 4.500 uIU/mL Final  Passed - Completed PHQ-2 or PHQ-9 in the last 360 days      Passed - Last BP in normal range    BP Readings from Last 1 Encounters:  06/26/24 132/74         Passed - Last Heart Rate in normal range    Pulse  Readings from Last 1 Encounters:  06/26/24 77         Passed - Valid encounter within last 6 months    Recent Outpatient Visits           3 months ago Primary hypertension   [facility], Megan P, DO              Passed - CBC within normal limits and completed in the last 12 months    WBC  Date Value Ref Range Status  06/26/2024 8.7 3.4 - 10.8 x10E3/uL Final  09/22/2021 6.4 4.0 - 10.5 K/uL Final   RBC  Date Value Ref Range Status  06/26/2024 4.51 4.14 - 5.80 x10E6/uL Final  09/22/2021 4.90 4.22 - 5.81 MIL/uL Final   Hemoglobin  Date Value Ref Range Status  06/26/2024 13.2 13.0 - 17.7 g/dL Final   Hematocrit  Date Value Ref Range Status  06/26/2024 40.0 37.5 - 51.0 % Final   MCHC  Date Value Ref Range Status  06/26/2024 33.0 31.5 - 35.7 g/dL Final  89/73/7977 66.8 30.0 - 36.0 g/dL Final   [facility]  Date Value Ref Range Status  06/26/2024 29.3 26.6 - 33.0 pg Final  09/22/2021 28.4 26.0 - 34.0 pg Final   MCV  Date Value Ref Range Status  06/26/2024 89 79 - 97 fL Final   No results found for: PLTCOUNTKUC, LABPLAT, POCPLA RDW  Date Value Ref Range Status  06/26/2024 13.1 11.6 - 15.4 % Final         Passed - CMP within normal limits and completed in the last 12 months    Albumin  Date Value Ref Range Status  12/01/2023 4.1 4.1 - 5.1 g/dL Final   Alkaline Phosphatase  Date Value Ref Range Status  12/01/2023 74 44 - 121 IU/L Final   ALT  Date Value Ref Range Status  12/01/2023 22 0 - 44 IU/L Final   AST  Date Value Ref Range Status  12/01/2023 17 0 - 40 IU/L Final   BUN  Date Value Ref Range Status  06/26/2024 13 6 - 24 mg/dL Final   Calcium  Date Value Ref Range Status  06/26/2024 9.0 8.7 - 10.2 mg/dL Final   CO2  Date Value Ref Range Status  06/26/2024 16 (L) 20 - 29 mmol/L Final   Creatinine  Date Value Ref Range Status  12/01/2023 279.8 20.0 - 300.0 mg/dL Final   Creatinine, Ser  Date Value Ref  Range Status  06/26/2024 1.34 (H) 0.76 - 1.27 mg/dL Final   Glucose  Date Value Ref Range Status  06/26/2024 97 70 - 99 mg/dL Final   Glucose, Bld  Date Value Ref Range Status  09/22/2021 156 (H) 70 - 99 mg/dL Final    Comment:    Glucose reference range applies only to samples taken after fasting for at least 8 hours.   Potassium  Date Value Ref Range Status  06/26/2024 3.5 3.5 - 5.2 mmol/L Final   Sodium  Date Value Ref Range Status  06/26/2024 141 134 - 144 mmol/L Final   Bilirubin Total  Date Value Ref Range Status  12/01/2023 0.7 0.0 -  1.2 mg/dL Final   Protein,UA  Date Value Ref Range Status  10/14/2022 Negative Negative/Trace Final   Total Protein  Date Value Ref Range Status  12/01/2023 7.0 6.0 - 8.5 g/dL Final   GFR calc Af Amer  Date Value Ref Range Status  09/11/2020 83 >59 mL/min/1.73 Final    Comment:    **In accordance with recommendations from the NKF-ASN Task force,**   [facility] is in the process of updating its eGFR calculation to the   2021 CKD-EPI creatinine equation that estimates kidney function   without a race variable.    eGFR  Date Value Ref Range Status  06/26/2024 67 >59 mL/min/1.73 Final   GFR, Estimated  Date Value Ref Range Status  09/22/2021 >60 >60 mL/min Final    Comment:    (NOTE) Calculated using the CKD-EPI Creatinine Equation (2021)

## 2024-12-13 ENCOUNTER — Other Ambulatory Visit: Payer: Self-pay | Admitting: Family Medicine

## 2024-12-13 NOTE — Telephone Encounter (Signed)
 Requested medications are due for refill today.  yes  Requested medications are on the active medications list.  yes  Last refill. 06/26/2024 #90 1 rf  Future visit scheduled.   yes  Notes to clinic.  Refill not delegated.    Requested Prescriptions  Pending Prescriptions Disp Refills   ARIPiprazole  (ABILIFY ) 2 MG tablet [Pharmacy Med Name: ARIPIPRAZOLE  2 MG TABLET] 30 tablet 5    Sig: TAKE 1 TABLET BY MOUTH EVERY DAY     Not Delegated - Psychiatry:  Antipsychotics - Second Generation (Atypical) - aripiprazole  Failed - 12/13/2024 12:51 PM      Failed - This refill cannot be delegated      Failed - TSH in normal range and within 360 days    TSH  Date Value Ref Range Status  12/01/2023 2.950 0.450 - 4.500 uIU/mL Final         Failed - Lipid Panel in normal range within the last 12 months    Cholesterol, Total  Date Value Ref Range Status  12/01/2023 168 100 - 199 mg/dL Final   LDL Chol Calc (NIH)  Date Value Ref Range Status  12/01/2023 106 (H) 0 - 99 mg/dL Final   HDL  Date Value Ref Range Status  12/01/2023 42 >39 mg/dL Final   Triglycerides  Date Value Ref Range Status  12/01/2023 108 0 - 149 mg/dL Final         Failed - CMP within normal limits and completed in the last 12 months    Albumin  Date Value Ref Range Status  12/01/2023 4.1 4.1 - 5.1 g/dL Final   Alkaline Phosphatase  Date Value Ref Range Status  12/01/2023 74 44 - 121 IU/L Final   ALT  Date Value Ref Range Status  12/01/2023 22 0 - 44 IU/L Final   AST  Date Value Ref Range Status  12/01/2023 17 0 - 40 IU/L Final   BUN  Date Value Ref Range Status  06/26/2024 13 6 - 24 mg/dL Final   Calcium  Date Value Ref Range Status  06/26/2024 9.0 8.7 - 10.2 mg/dL Final   CO2  Date Value Ref Range Status  06/26/2024 16 (L) 20 - 29 mmol/L Final   Creatinine  Date Value Ref Range Status  12/01/2023 279.8 20.0 - 300.0 mg/dL Final   Creatinine, Ser  Date Value Ref Range Status  06/26/2024  1.34 (H) 0.76 - 1.27 mg/dL Final   Glucose  Date Value Ref Range Status  06/26/2024 97 70 - 99 mg/dL Final   Glucose, Bld  Date Value Ref Range Status  09/22/2021 156 (H) 70 - 99 mg/dL Final    Comment:    Glucose reference range applies only to samples taken after fasting for at least 8 hours.   Potassium  Date Value Ref Range Status  06/26/2024 3.5 3.5 - 5.2 mmol/L Final   Sodium  Date Value Ref Range Status  06/26/2024 141 134 - 144 mmol/L Final   Bilirubin Total  Date Value Ref Range Status  12/01/2023 0.7 0.0 - 1.2 mg/dL Final   Protein,UA  Date Value Ref Range Status  10/14/2022 Negative Negative/Trace Final   Total Protein  Date Value Ref Range Status  12/01/2023 7.0 6.0 - 8.5 g/dL Final   GFR calc Af Amer  Date Value Ref Range Status  09/11/2020 83 >59 mL/min/1.73 Final    Comment:    **In accordance with recommendations from the NKF-ASN Task force,**   [facility] is in the  process of updating its eGFR calculation to the   2021 CKD-EPI creatinine equation that estimates kidney function   without a race variable.    eGFR  Date Value Ref Range Status  06/26/2024 67 >59 mL/min/1.73 Final   GFR, Estimated  Date Value Ref Range Status  09/22/2021 >60 >60 mL/min Final    Comment:    (NOTE) Calculated using the CKD-EPI Creatinine Equation (2021)          Passed - Completed PHQ-2 or PHQ-9 in the last 360 days      Passed - Last BP in normal range    BP Readings from Last 1 Encounters:  06/26/24 132/74         Passed - Last Heart Rate in normal range    Pulse Readings from Last 1 Encounters:  06/26/24 77         Passed - Valid encounter within last 6 months    Recent Outpatient Visits           5 months ago Primary hypertension   [place] [facility] [place], Megan P, DO              Passed - CBC within normal limits and completed in the last 12 months    WBC  Date Value Ref Range Status  06/26/2024 8.7 3.4 - 10.8 x10E3/uL  Final  09/22/2021 6.4 4.0 - 10.5 K/uL Final   RBC  Date Value Ref Range Status  06/26/2024 4.51 4.14 - 5.80 x10E6/uL Final  09/22/2021 4.90 4.22 - 5.81 MIL/uL Final   Hemoglobin  Date Value Ref Range Status  06/26/2024 13.2 13.0 - 17.7 g/dL Final   Hematocrit  Date Value Ref Range Status  06/26/2024 40.0 37.5 - 51.0 % Final   MCHC  Date Value Ref Range Status  06/26/2024 33.0 31.5 - 35.7 g/dL Final  89/73/7977 66.8 30.0 - 36.0 g/dL Final   [facility]  Date Value Ref Range Status  06/26/2024 29.3 26.6 - 33.0 pg Final  09/22/2021 28.4 26.0 - 34.0 pg Final   MCV  Date Value Ref Range Status  06/26/2024 89 79 - 97 fL Final   No results found for: PLTCOUNTKUC, LABPLAT, POCPLA RDW  Date Value Ref Range Status  06/26/2024 13.1 11.6 - 15.4 % Final

## 2024-12-14 ENCOUNTER — Other Ambulatory Visit: Payer: Self-pay | Admitting: Family Medicine

## 2024-12-16 NOTE — Telephone Encounter (Signed)
 Requested medication (s) are due for refill today: see below  Requested medication (s) are on the active medication list: yes  Last refill:  06/26/24  Future visit scheduled: yes  Notes to clinic:   Pharmacy comment: Alternative Requested:PA REQUIRED.   All Pharmacy Suggested Alternatives:  pantoprazole (PROTONIX) 40 MG tablet lansoprazole (PREVACID) 30 MG capsule RABEprazole (ACIPHEX) 20 MG tablet       Requested Prescriptions  Pending Prescriptions Disp Refills   pantoprazole (PROTONIX) 40 MG tablet [Pharmacy Med Name: PANTOPRAZOLE SOD DR 40 MG TAB]  0     Gastroenterology: Proton Pump Inhibitors Passed - 12/16/2024 12:43 PM      Passed - Valid encounter within last 12 months    Recent Outpatient Visits           5 months ago Primary hypertension   [place] [facility] [employer], [place], DO

## 2024-12-31 ENCOUNTER — Encounter: Admitting: Family Medicine

## 2025-01-03 ENCOUNTER — Ambulatory Visit: Admitting: Family Medicine

## 2025-01-03 ENCOUNTER — Encounter: Payer: Self-pay | Admitting: Family Medicine

## 2025-01-03 VITALS — BP 134/76 | HR 75 | Temp 98.0°F | Ht 72.0 in | Wt 385.6 lb

## 2025-01-03 DIAGNOSIS — Z0000 Encounter for general adult medical examination without abnormal findings: Secondary | ICD-10-CM

## 2025-01-03 DIAGNOSIS — I10 Essential (primary) hypertension: Secondary | ICD-10-CM

## 2025-01-03 DIAGNOSIS — D6851 Activated protein C resistance: Secondary | ICD-10-CM

## 2025-01-03 LAB — MICROALBUMIN, URINE WAIVED
Creatinine, Urine Waived: 300 mg/dL (ref 10–300)
Microalb, Ur Waived: 30 mg/L — ABNORMAL HIGH (ref 0–19)
Microalb/Creat Ratio: <30 mg/g

## 2025-01-03 LAB — BAYER DCA HB A1C WAIVED: HB A1C (BAYER DCA - WAIVED): 6.2 % — ABNORMAL HIGH (ref 4.8–5.6)

## 2025-01-03 MED ORDER — ALPRAZOLAM 0.5 MG PO TABS: ORAL_TABLET | ORAL | 0 refills | Status: AC

## 2025-01-03 MED ORDER — WEGOVY 1.5 MG PO TABS: 1.5000 mg | ORAL_TABLET | Freq: Every day | ORAL | 0 refills | Status: AC

## 2025-01-03 MED ORDER — ARIPIPRAZOLE 2 MG PO TABS: 2.0000 mg | ORAL_TABLET | Freq: Every day | ORAL | 1 refills | Status: AC

## 2025-01-03 MED ORDER — LISINOPRIL 10 MG PO TABS: 10.0000 mg | ORAL_TABLET | Freq: Every day | ORAL | 1 refills | Status: AC

## 2025-01-03 MED ORDER — SERTRALINE HCL 100 MG PO TABS: ORAL_TABLET | ORAL | 1 refills | Status: AC

## 2025-01-03 MED ORDER — PANTOPRAZOLE SODIUM 40 MG PO TBEC: 40.0000 mg | DELAYED_RELEASE_TABLET | Freq: Every day | ORAL | 1 refills | Status: AC

## 2025-01-03 MED ORDER — APIXABAN 5 MG PO TABS: ORAL_TABLET | ORAL | 3 refills | Status: AC

## 2025-01-03 MED ORDER — WEGOVY 4 MG PO TABS: 4.0000 mg | ORAL_TABLET | Freq: Every day | ORAL | 1 refills | Status: AC

## 2025-01-03 NOTE — Progress Notes (Unsigned)
 "  BP (!) 166/94   Pulse 75   Temp 98 F (36.7 C) (Oral)   Ht 6' (1.829 m)   Wt (!) 385 lb 9.6 oz (174.9 kg)   SpO2 98%   BMI 52.30 kg/m    Subjective:    Patient ID: Shane Oneill, male    DOB: 1981-04-30, 44 y.o.   MRN: [id number]  HPI: Shane Oneill is a 44 y.o. male presenting on 01/03/2025 for comprehensive medical examination. Current medical complaints include:  OBESITY Duration:  Previous attempts at weight loss: {Blank single:19197::yes,no} Complications of obesity:  Peak weight:  Weight loss goal:  Weight loss to date:  Requesting obesity pharmacotherapy: {Blank single:19197::yes,no} Current weight loss supplements/medications: {Blank single:19197::yes,no} Previous weight loss supplements/meds: {Blank single:19197::yes,no}  HYPERTENSION {Blank single:19197::without,with} Chronic Kidney Disease Hypertension status: {Blank single:19197::controlled,uncontrolled,better,worse,exacerbated,stable}  Satisfied with current treatment? {Blank single:19197::yes,no} Duration of hypertension: {Blank single:19197::chronic,months,years} BP monitoring frequency:  {Blank single:19197::not checking,rarely,daily,weekly,monthly,a few times a day,a few times a week,a few times a month} BP range:  BP medication side effects:  {Blank single:19197::yes,no} Medication compliance: {Blank single:19197::excellent compliance,good compliance,fair compliance,poor compliance} Previous BP meds:{Blank multiple:19196::none,amlodipine,amlodipine/benazepril,atenolol,benazepril,benazepril/HCTZ,bisoprolol (bystolic),carvedilol,chlorthalidone,clonidine,diltiazem,exforge HCT,HCTZ,irbesartan (avapro),labetalol,lisinopril ,lisinopril -HCTZ,losartan (cozaar),methyldopa,nifedipine,olmesartan  (benicar),olmesartan-HCTZ,quinapril,ramipril,spironalactone,tekturna,valsartan,valsartan-HCTZ,verapamil} Aspirin: {Blank single:19197::yes,no} Recurrent headaches: {Blank single:19197::yes,no} Visual changes: {Blank single:19197::yes,no} Palpitations: {Blank single:19197::yes,no} Dyspnea: {Blank single:19197::yes,no} Chest pain: {Blank single:19197::yes,no} Lower extremity edema: {Blank single:19197::yes,no} Dizzy/lightheaded: {Blank single:19197::yes,no}  ANXIETY/STRESS Duration:{Blank single:19197::controlled,uncontrolled,better,worse,exacerbated,stable} Anxious mood: {Blank single:19197::yes,no}  Excessive worrying: {Blank single:19197::yes,no} Irritability: {Blank single:19197::yes,no}  Sweating: {Blank single:19197::yes,no} Nausea: {Blank single:19197::yes,no} Palpitations:{Blank single:19197::yes,no} Hyperventilation: {Blank single:19197::yes,no} Panic attacks: {Blank single:19197::yes,no} Agoraphobia: {Blank single:19197::yes,no}  Obscessions/compulsions: {Blank single:19197::yes,no} Depressed mood: {Blank single:19197::yes,no}    01/03/2025    8:08 AM 06/26/2024    2:14 PM 12/29/2023    8:48 AM 12/04/2023   10:00 AM 07/25/2023    3:10 PM  Depression screen PHQ 2/9  Decreased Interest 0 0 0 0 0  Down, Depressed, Hopeless 0 1 0 1 0  PHQ - 2 Score 0 1 0 1 0  Altered sleeping 0 0 1 2 0  Tired, decreased energy 0 0 1 2 1   Change in appetite 1 0 0 2 0  Feeling bad or failure about yourself  0 1 0 0 0  Trouble concentrating 0 0 0 0 0  Moving slowly or fidgety/restless 0 0 0 0 0  Suicidal thoughts 0 0 0 0 0  PHQ-9 Score 1 2  2  7  1    Difficult doing work/chores Not difficult at all Not difficult at all Not difficult at all Somewhat difficult Not difficult at all     Data saved with a previous flowsheet row definition   Anhedonia: {Blank single:19197::yes,no} Weight  changes: {Blank single:19197::yes,no} Insomnia: {Blank single:19197::yes,no} {Blank single:19197::hard to fall asleep,hard to stay asleep}  Hypersomnia: {Blank single:19197::yes,no} Fatigue/loss of energy: {Blank single:19197::yes,no} Feelings of worthlessness: {Blank single:19197::yes,no} Feelings of guilt: {Blank single:19197::yes,no} Impaired concentration/indecisiveness: {Blank single:19197::yes,no} Suicidal ideations: {Blank single:19197::yes,no}  Crying spells: {Blank single:19197::yes,no} Recent Stressors/Life Changes: {Blank single:19197::yes,no}   Relationship problems: {Blank single:19197::yes,no}   Family stress: {Blank single:19197::yes,no}     Financial stress: {Blank single:19197::yes,no}    Job stress: {Blank single:19197::yes,no}    Recent death/loss: {Blank single:19197::yes,no}    He currently lives with: Interim Problems from his last visit: {Blank single:19197::yes,no}  Depression Screen done today and results listed below:     01/03/2025    8:08 AM 06/26/2024    2:14 PM 12/29/2023    8:48 AM 12/04/2023   10:00 AM 07/25/2023    3:10 PM  Depression screen PHQ 2/9  Decreased Interest 0 0 0 0 0  Down, Depressed, Hopeless 0 1 0 1 0  PHQ - 2 Score 0 1 0 1 0  Altered sleeping 0 0 1 2 0  Tired, decreased energy 0 0 1 2 1   Change in appetite 1 0 0 2 0  Feeling bad or failure about yourself  0 1 0 0 0  Trouble concentrating 0 0 0 0 0  Moving slowly or fidgety/restless 0 0 0 0 0  Suicidal thoughts 0 0 0 0 0  PHQ-9 Score 1 2  2  7  1    Difficult doing work/chores Not difficult at all Not difficult at all Not difficult at all Somewhat difficult Not difficult at all     Data saved with a previous flowsheet row definition    Past Medical History:  Past Medical History:  Diagnosis Date   Anxiety    Blood clot in vein    Idiopathic intracranial hypertension    Sleep apnea     Surgical History:   Past Surgical History:  Procedure Laterality Date   club feet      Medications:  Current Outpatient Medications on File Prior to Visit  Medication Sig   acetaZOLAMIDE  ER (DIAMOX ) 500 MG capsule Take by mouth.   ALPRAZolam  (XANAX ) 0.5 MG tablet alprazolam  0.5 mg tablet daily PRN   apixaban  (ELIQUIS ) 5 MG TABS tablet TAKE 1 TABLET(5 MG) BY MOUTH TWICE DAILY   ARIPiprazole  (ABILIFY ) 2 MG tablet Take 1 tablet (2 mg total) by mouth daily.   lisinopril  (ZESTRIL ) 10 MG tablet Take 1 tablet (10 mg total) by mouth daily.   pantoprazole  (PROTONIX ) 40 MG tablet Take 1 tablet (40 mg total) by mouth daily.   sertraline  (ZOLOFT ) 100 MG tablet TAKE 2 TABLETS BY MOUTH EVERY DAY   testosterone  (ANDROGEL ) 50 MG/5GM (1%) GEL Place 5 g onto the skin daily.   No current facility-administered medications on file prior to visit.    Allergies:  Allergies[1]  Social History:  Social History   Socioeconomic History   Marital status: Single    Spouse name: Not on file   Number of children: Not on file   Years of education: Not on file   Highest education level: Bachelor's degree (e.g., BA, AB, BS)  Occupational History   Not on file  Tobacco Use   Smoking status: Former    Current packs/day: 0.00    Average packs/day: 0.5 packs/day for 10.0 years (5.0 ttl pk-yrs)    Types: Cigarettes    Start date: 11/27/2005    Quit date: 11/28/2015    Years since quitting: 9.1   Smokeless tobacco: Never  Vaping Use   Vaping status: Never Used  Substance and Sexual Activity   Alcohol use: Not Currently   Drug use: Never   Sexual activity: Yes  Other Topics Concern   Not on file  Social History Narrative   Not on file   Social Drivers of Health   Tobacco Use: Medium Risk (01/03/2025)   Patient History    Smoking Tobacco Use: Former    Smokeless Tobacco Use: Never    Passive Exposure: Not on file  Financial Resource Strain: Low Risk (12/29/2023)   Overall Financial Resource  Strain (CARDIA)    Difficulty of Paying Living Expenses: Not hard at all  Food Insecurity: No Food Insecurity (12/29/2023)   Hunger Vital Sign    Worried About Running Out of Food in the Last Year: Never true  Ran Out of Food in the Last Year: Never true  Transportation Needs: No Transportation Needs (12/29/2023)   PRAPARE - Administrator, Civil Service (Medical): No    Lack of Transportation (Non-Medical): No  Physical Activity: Insufficiently Active (12/29/2023)   Exercise Vital Sign    Days of Exercise per Week: 1 day    Minutes of Exercise per Session: 20 min  Stress: Stress Concern Present (12/29/2023)   [employer] of Occupational Health - Occupational Stress Questionnaire    Feeling of Stress : To some extent  Social Connections: Moderately Integrated (12/29/2023)   Social Connection and Isolation Panel    Frequency of Communication with Friends and Family: Twice a week    Frequency of Social Gatherings with Friends and Family: Once a week    Attends Religious Services: More than 4 times per year    Active Member of Clubs or Organizations: Yes    Attends Banker Meetings: More than 4 times per year    Marital Status: Never married  Intimate Partner Violence: Not on file  Depression (PHQ2-9): Low Risk (01/03/2025)   Depression (PHQ2-9)    PHQ-2 Score: 1  Alcohol Screen: Low Risk (12/29/2023)   Alcohol Screen    Last Alcohol Screening Score (AUDIT): 1  Housing: Low Risk (12/29/2023)   Housing Stability Vital Sign    Unable to Pay for Housing in the Last Year: No    Number of Times Moved in the Last Year: 0    Homeless in the Last Year: No  Utilities: Not on file  Health Literacy: Low Risk (06/07/2023)   Received from [facility]   Health Literacy    : Never   Tobacco Use History[2] Social History   Substance and Sexual Activity  Alcohol Use Not Currently    Family History:  Family History  Problem Relation Age of  Onset   Hypertension Mother    Healthy Father    Cancer Maternal Aunt     Past medical history, surgical history, medications, allergies, family history and social history reviewed with patient today and changes made to appropriate areas of the chart.   Review of Systems  Constitutional: Negative.   HENT: Negative.    Eyes: Negative.        Having some trouble seeing  Respiratory: Negative.    Cardiovascular: Negative.   Gastrointestinal: Negative.   Genitourinary: Negative.   Musculoskeletal:  Positive for joint pain. Negative for back pain, falls, myalgias and neck pain.  Skin: Negative.   Neurological: Negative.   Endo/Heme/Allergies:  Positive for environmental allergies. Negative for polydipsia. Does not bruise/bleed easily.  Psychiatric/Behavioral: Negative.     All other ROS negative except what is listed above and in the HPI.      Objective:    BP (!) 166/94   Pulse 75   Temp 98 F (36.7 C) (Oral)   Ht 6' (1.829 m)   Wt (!) 385 lb 9.6 oz (174.9 kg)   SpO2 98%   BMI 52.30 kg/m   Wt Readings from Last 3 Encounters:  01/03/25 (!) 385 lb 9.6 oz (174.9 kg)  06/26/24 (!) 370 lb 12.8 oz (168.2 kg)  12/29/23 (!) 368 lb (166.9 kg)    Physical Exam Vitals and nursing note reviewed.  Constitutional:      General: He is not in acute distress.    Appearance: Normal appearance. He is obese. He is not ill-appearing, toxic-appearing or diaphoretic.  HENT:  Head: Normocephalic and atraumatic.     Right Ear: Tympanic membrane, ear canal and external ear normal. There is no impacted cerumen.     Left Ear: Tympanic membrane, ear canal and external ear normal. There is no impacted cerumen.     Nose: Nose normal. No congestion or rhinorrhea.     Mouth/Throat:     Mouth: Mucous membranes are moist.     Pharynx: Oropharynx is clear. No oropharyngeal exudate or posterior oropharyngeal erythema.  Eyes:     General: No scleral icterus.       Right eye: No discharge.         Left eye: No discharge.     Extraocular Movements: Extraocular movements intact.     Conjunctiva/sclera: Conjunctivae normal.     Pupils: Pupils are equal, round, and reactive to light.  Neck:     Vascular: No carotid bruit.  Cardiovascular:     Rate and Rhythm: Normal rate and regular rhythm.     Pulses: Normal pulses.     Heart sounds: No murmur heard.    No friction rub. No gallop.  Pulmonary:     Effort: Pulmonary effort is normal. No respiratory distress.     Breath sounds: Normal breath sounds. No stridor. No wheezing, rhonchi or rales.  Chest:     Chest wall: No tenderness.  Abdominal:     General: Abdomen is flat. Bowel sounds are normal. There is no distension.     Palpations: Abdomen is soft. There is no mass.     Tenderness: There is no abdominal tenderness. There is no right CVA tenderness, left CVA tenderness, guarding or rebound.     Hernia: No hernia is present.  Genitourinary:    Comments: Genital exam deferred with shared decision making Musculoskeletal:        General: No swelling, tenderness, deformity or signs of injury.     Cervical back: Normal range of motion and neck supple. No rigidity. No muscular tenderness.     Right lower leg: No edema.     Left lower leg: No edema.  Lymphadenopathy:     Cervical: No cervical adenopathy.  Skin:    General: Skin is warm and dry.     Capillary Refill: Capillary refill takes less than 2 seconds.     Coloration: Skin is not jaundiced or pale.     Findings: No bruising, erythema, lesion or rash.  Neurological:     General: No focal deficit present.     Mental Status: He is alert and oriented to person, place, and time.     Cranial Nerves: No cranial nerve deficit.     Sensory: No sensory deficit.     Motor: No weakness.     Coordination: Coordination normal.     Gait: Gait normal.     Deep Tendon Reflexes: Reflexes normal.  Psychiatric:        Mood and Affect: Mood normal.        Behavior: Behavior normal.         Thought Content: Thought content normal.        Judgment: Judgment normal.     Results for orders placed or performed in visit on 06/26/24  Hepatitis B surface antibody,quantitative   Collection Time: 06/26/24  2:32 PM  Result Value Ref Range   Hepatitis B Surf Ab Quant 4.5 (L) Immunity>10 mIU/mL  CBC with Differential/Platelet   Collection Time: 06/26/24  2:32 PM  Result Value Ref Range   WBC 8.7  3.4 - 10.8 x10E3/uL   RBC 4.51 4.14 - 5.80 x10E6/uL   Hemoglobin 13.2 13.0 - 17.7 g/dL   Hematocrit 59.9 62.4 - 51.0 %   MCV 89 79 - 97 fL   MCH 29.3 26.6 - 33.0 pg   MCHC 33.0 31.5 - 35.7 g/dL   RDW 86.8 88.3 - 84.5 %   Platelets 257 150 - 450 x10E3/uL   Neutrophils 70 Not Estab. %   Lymphs 25 Not Estab. %   Monocytes 4 Not Estab. %   Eos 1 Not Estab. %   Basos 0 Not Estab. %   Neutrophils Absolute 5.9 1.4 - 7.0 x10E3/uL   Lymphocytes Absolute 2.2 0.7 - 3.1 x10E3/uL   Monocytes Absolute 0.4 0.1 - 0.9 x10E3/uL   EOS (ABSOLUTE) 0.1 0.0 - 0.4 x10E3/uL   Basophils Absolute 0.0 0.0 - 0.2 x10E3/uL   Immature Granulocytes 0 Not Estab. %   Immature Grans (Abs) 0.0 0.0 - 0.1 x10E3/uL  Basic metabolic panel with GFR   Collection Time: 06/26/24  2:32 PM  Result Value Ref Range   Glucose 97 70 - 99 mg/dL   BUN 13 6 - 24 mg/dL   Creatinine, Ser 8.65 (H) 0.76 - 1.27 mg/dL   eGFR 67 >40 fO/fpw/8.26   BUN/Creatinine Ratio 10 9 - 20   Sodium 141 134 - 144 mmol/L   Potassium 3.5 3.5 - 5.2 mmol/L   Chloride 108 (H) 96 - 106 mmol/L   CO2 16 (L) 20 - 29 mmol/L   Calcium 9.0 8.7 - 10.2 mg/dL      Assessment & Plan:   Problem List Items Addressed This Visit   None    Discussed aspirin prophylaxis for myocardial infarction prevention and decision was {Blank single:19197::it was not indicated,made to continue ASA,made to start ASA,made to stop ASA,that we recommended ASA, and patient refused}  LABORATORY TESTING:  Health maintenance labs ordered today as discussed above.   The  natural history of prostate cancer and ongoing controversy regarding screening and potential treatment outcomes of prostate cancer has been discussed with the patient. The meaning of a false positive PSA and a false negative PSA has been discussed. He indicates understanding of the limitations of this screening test and wishes *** to proceed with screening PSA testing.   IMMUNIZATIONS:   - Tdap: Tetanus vaccination status reviewed: {tetanus status:315746}. - Influenza: {Blank single:19197::Up to date,Administered today,Postponed to flu season,Refused,Given elsewhere} - Pneumovax: {Blank single:19197::Up to date,Administered today,Not applicable,Refused,Given elsewhere} - Prevnar: {Blank single:19197::Up to date,Administered today,Not applicable,Refused,Given elsewhere} - COVID: {Blank single:19197::Up to date,Administered today,Not applicable,Refused,Given elsewhere} - HPV: {Blank single:19197::Up to date,Administered today,Not applicable,Refused,Given elsewhere} - Shingrix vaccine: {Blank single:19197::Up to date,Administered today,Not applicable,Refused,Given elsewhere}  SCREENING: - Colonoscopy: {Blank single:19197::Up to date,Ordered today,Not applicable,Refused,Done elsewhere}  Discussed with patient purpose of the colonoscopy is to detect colon cancer at curable precancerous or early stages   - AAA Screening: {Blank single:19197::Up to date,Ordered today,Not applicable,Refused,Done elsewhere}  -Hearing Test: {Blank single:19197::Up to date,Ordered today,Not applicable,Refused,Done elsewhere}  -Spirometry: {Blank single:19197::Up to date,Ordered today,Not applicable,Refused,Done elsewhere}   PATIENT COUNSELING:    Sexuality: Discussed sexually transmitted diseases, partner selection, use of condoms, avoidance of unintended pregnancy  and contraceptive alternatives.   Advised to avoid  cigarette smoking.  I discussed with the patient that most people either abstain from alcohol or drink within safe limits (<=14/week and <=4 drinks/occasion for males, <=7/weeks and <= 3 drinks/occasion for females) and that the risk for alcohol disorders and other health effects rises proportionally  with the number of drinks per week and how often a drinker exceeds daily limits.  Discussed cessation/primary prevention of drug use and availability of treatment for abuse.   Diet: Encouraged to adjust caloric intake to maintain  or achieve ideal body weight, to reduce intake of dietary saturated fat and total fat, to limit sodium intake by avoiding high sodium foods and not adding table salt, and to maintain adequate dietary potassium and calcium preferably from fresh fruits, vegetables, and low-fat dairy products.    stressed the importance of regular exercise  Injury prevention: Discussed safety belts, safety helmets, smoke detector, smoking near bedding or upholstery.   Dental health: Discussed importance of regular tooth brushing, flossing, and dental visits.   Follow up plan: NEXT PREVENTATIVE PHYSICAL DUE IN 1 YEAR. No follow-ups on file.      [1] Allergies Allergen Reactions   Cefoxitin Hives  [2] Social History Tobacco Use  Smoking Status Former   Current packs/day: 0.00   Average packs/day: 0.5 packs/day for 10.0 years (5.0 ttl pk-yrs)   Types: Cigarettes   Start date: 11/27/2005   Quit date: 11/28/2015   Years since quitting: 9.1  Smokeless Tobacco Never  "

## 2025-02-19 ENCOUNTER — Encounter: Admitting: Family Medicine

## 2025-02-20 ENCOUNTER — Ambulatory Visit: Admitting: Family Medicine
